# Patient Record
Sex: Female | Born: 1965 | Race: White | Hispanic: No | Marital: Married | Smoking: Never smoker
Health system: Southern US, Community
[De-identification: ages and names within clinical notes are randomized; demographics above are authoritative.]

## PROBLEM LIST (undated history)

## (undated) DIAGNOSIS — E079 Disorder of thyroid, unspecified: Secondary | ICD-10-CM

## (undated) DIAGNOSIS — G4730 Sleep apnea, unspecified: Secondary | ICD-10-CM

## (undated) DIAGNOSIS — R600 Localized edema: Secondary | ICD-10-CM

## (undated) DIAGNOSIS — E669 Obesity, unspecified: Secondary | ICD-10-CM

## (undated) DIAGNOSIS — D649 Anemia, unspecified: Secondary | ICD-10-CM

## (undated) DIAGNOSIS — E538 Deficiency of other specified B group vitamins: Secondary | ICD-10-CM

## (undated) DIAGNOSIS — E0590 Thyrotoxicosis, unspecified without thyrotoxic crisis or storm: Secondary | ICD-10-CM

## (undated) DIAGNOSIS — B019 Varicella without complication: Secondary | ICD-10-CM

## (undated) DIAGNOSIS — K219 Gastro-esophageal reflux disease without esophagitis: Secondary | ICD-10-CM

## (undated) DIAGNOSIS — R739 Hyperglycemia, unspecified: Secondary | ICD-10-CM

## (undated) DIAGNOSIS — N644 Mastodynia: Secondary | ICD-10-CM

## (undated) DIAGNOSIS — Z8639 Personal history of other endocrine, nutritional and metabolic disease: Secondary | ICD-10-CM

## (undated) DIAGNOSIS — F418 Other specified anxiety disorders: Secondary | ICD-10-CM

## (undated) DIAGNOSIS — E785 Hyperlipidemia, unspecified: Secondary | ICD-10-CM

## (undated) HISTORY — DX: Disorder of thyroid, unspecified: E07.9

## (undated) HISTORY — DX: Other specified anxiety disorders: F41.8

## (undated) HISTORY — DX: Hyperlipidemia, unspecified: E78.5

## (undated) HISTORY — DX: Sleep apnea, unspecified: G47.30

## (undated) HISTORY — DX: Obesity, unspecified: E66.9

## (undated) HISTORY — DX: Mastodynia: N64.4

## (undated) HISTORY — PX: CHOLECYSTECTOMY: SHX55

## (undated) HISTORY — DX: Deficiency of other specified B group vitamins: E53.8

## (undated) HISTORY — DX: Localized edema: R60.0

## (undated) HISTORY — DX: Gastro-esophageal reflux disease without esophagitis: K21.9

## (undated) HISTORY — DX: Anemia, unspecified: D64.9

## (undated) HISTORY — PX: EYE SURGERY: SHX253

## (undated) HISTORY — DX: Hyperglycemia, unspecified: R73.9

## (undated) HISTORY — DX: Personal history of other endocrine, nutritional and metabolic disease: Z86.39

## (undated) HISTORY — DX: Thyrotoxicosis, unspecified without thyrotoxic crisis or storm: E05.90

## (undated) HISTORY — DX: Varicella without complication: B01.9

---

## 2000-12-17 ENCOUNTER — Other Ambulatory Visit: Admission: RE | Admit: 2000-12-17 | Discharge: 2000-12-17 | Payer: Self-pay | Admitting: *Deleted

## 2002-01-05 ENCOUNTER — Other Ambulatory Visit: Admission: RE | Admit: 2002-01-05 | Discharge: 2002-01-05 | Payer: Self-pay | Admitting: *Deleted

## 2003-02-18 ENCOUNTER — Inpatient Hospital Stay (HOSPITAL_COMMUNITY): Admission: AD | Admit: 2003-02-18 | Discharge: 2003-02-21 | Payer: Self-pay | Admitting: *Deleted

## 2003-02-18 ENCOUNTER — Encounter (INDEPENDENT_AMBULATORY_CARE_PROVIDER_SITE_OTHER): Payer: Self-pay | Admitting: *Deleted

## 2003-02-18 ENCOUNTER — Encounter: Payer: Self-pay | Admitting: Obstetrics and Gynecology

## 2003-02-22 ENCOUNTER — Encounter: Admission: RE | Admit: 2003-02-22 | Discharge: 2003-03-24 | Payer: Self-pay | Admitting: Obstetrics and Gynecology

## 2003-03-18 ENCOUNTER — Other Ambulatory Visit: Admission: RE | Admit: 2003-03-18 | Discharge: 2003-03-18 | Payer: Self-pay | Admitting: *Deleted

## 2005-07-12 ENCOUNTER — Encounter (INDEPENDENT_AMBULATORY_CARE_PROVIDER_SITE_OTHER): Payer: Self-pay | Admitting: *Deleted

## 2005-07-12 ENCOUNTER — Ambulatory Visit (HOSPITAL_COMMUNITY): Admission: RE | Admit: 2005-07-12 | Discharge: 2005-07-12 | Payer: Self-pay | Admitting: Gastroenterology

## 2005-09-27 ENCOUNTER — Emergency Department (HOSPITAL_COMMUNITY): Admission: EM | Admit: 2005-09-27 | Discharge: 2005-09-27 | Payer: Self-pay | Admitting: Family Medicine

## 2007-04-22 ENCOUNTER — Emergency Department (HOSPITAL_COMMUNITY): Admission: EM | Admit: 2007-04-22 | Discharge: 2007-04-22 | Payer: Self-pay | Admitting: Family Medicine

## 2007-10-02 ENCOUNTER — Emergency Department (HOSPITAL_COMMUNITY): Admission: EM | Admit: 2007-10-02 | Discharge: 2007-10-02 | Payer: Self-pay | Admitting: Family Medicine

## 2009-09-28 ENCOUNTER — Encounter: Admission: RE | Admit: 2009-09-28 | Discharge: 2009-09-28 | Payer: Self-pay | Admitting: Obstetrics & Gynecology

## 2009-10-30 ENCOUNTER — Ambulatory Visit (HOSPITAL_COMMUNITY): Admission: RE | Admit: 2009-10-30 | Discharge: 2009-10-30 | Payer: Self-pay | Admitting: Endocrinology

## 2009-11-22 ENCOUNTER — Encounter: Admission: RE | Admit: 2009-11-22 | Discharge: 2009-11-22 | Payer: Self-pay | Admitting: Endocrinology

## 2010-01-25 HISTORY — PX: RETINAL DETACHMENT REPAIR W/ SCLERAL BUCKLE LE: SHX2338

## 2010-05-27 LAB — HM MAMMOGRAPHY

## 2010-06-02 ENCOUNTER — Encounter: Payer: Self-pay | Admitting: Family Medicine

## 2010-06-02 ENCOUNTER — Ambulatory Visit
Admission: RE | Admit: 2010-06-02 | Discharge: 2010-06-02 | Payer: Self-pay | Source: Home / Self Care | Admitting: Family Medicine

## 2010-06-02 DIAGNOSIS — J069 Acute upper respiratory infection, unspecified: Secondary | ICD-10-CM | POA: Insufficient documentation

## 2010-06-02 DIAGNOSIS — E0590 Thyrotoxicosis, unspecified without thyrotoxic crisis or storm: Secondary | ICD-10-CM

## 2010-06-02 DIAGNOSIS — Z8639 Personal history of other endocrine, nutritional and metabolic disease: Secondary | ICD-10-CM

## 2010-06-02 HISTORY — DX: Personal history of other endocrine, nutritional and metabolic disease: Z86.39

## 2010-06-02 HISTORY — DX: Thyrotoxicosis, unspecified without thyrotoxic crisis or storm: E05.90

## 2010-06-28 NOTE — Assessment & Plan Note (Signed)
Summary: COLD?/TM Room 5   Vital Signs:  Patient Profile:   45 Years Old Female CC:      Cough, congestion x 5 days Height:     65 inches Weight:      247 pounds O2 Sat:      96 % O2 treatment:    Room Air Temp:     99.8 degrees F oral Pulse rate:   105 / minute Pulse rhythm:   regular Resp:     16 per minute BP sitting:   108 / 78  (left arm) Cuff size:   large  Vitals Entered By: Emilio Math (June 02, 2010 2:38 PM)                  Current Allergies: No known allergies History of Present Illness Chief Complaint: Cough, congestion x 5 days History of Present Illness:  Subjective: Patient complains of URI symptoms for 4 days.  She states that she was treated for pneumonia two weeks ago, and seemed to be well. No sore throat. + cough for 4 days. No pleuritic pain No wheezing + nasal congestion for 4 days. ? post-nasal drainage No sinus pain/pressure No itchy/red eyes No earache No hemoptysis No SOB + fever/chills to 100.6 yesterday. No nausea No vomiting No abdominal pain No diarrhea No skin rashes + fatigue + myalgias No headache Used OTC meds without relief   REVIEW OF SYSTEMS Constitutional Symptoms       Complains of fever and chills.     Denies night sweats, weight loss, weight gain, and fatigue.  Eyes       Denies change in vision, eye pain, eye discharge, glasses, contact lenses, and eye surgery. Ear/Nose/Throat/Mouth       Denies hearing loss/aids, change in hearing, ear pain, ear discharge, dizziness, frequent runny nose, frequent nose bleeds, sinus problems, sore throat, hoarseness, and tooth pain or bleeding.  Respiratory       Complains of productive cough and wheezing.      Denies dry cough, shortness of breath, asthma, bronchitis, and emphysema/COPD.  Cardiovascular       Denies murmurs, chest pain, and tires easily with exhertion.    Gastrointestinal       Denies stomach pain, nausea/vomiting, diarrhea, constipation, blood in  bowel movements, and indigestion. Genitourniary       Denies painful urination, kidney stones, and loss of urinary control. Neurological       Denies paralysis, seizures, and fainting/blackouts. Musculoskeletal       Complains of muscle pain.      Denies joint stiffness, decreased range of motion, redness, swelling, muscle weakness, and gout.  Skin       Denies bruising, unusual mles/lumps or sores, and hair/skin or nail changes.  Psych       Denies mood changes, temper/anger issues, anxiety/stress, speech problems, depression, and sleep problems.  Past History:  Past Medical History: Hyperthyroidism  Past Surgical History: Scleral buckle Caesarean section Laparotomy-exploratory  Family History: Mother, High Chol Father, High Chol, HTN  Social History: Non smoker No ETOH No DRugs  Nurse [employer]   Objective:  No acute distress  Eyes:  Pupils are equal, round, and reactive to light and accomdation.  Extraocular movement is intact.  Conjunctivae are not inflamed.  Ears:  Canals normal.  Tympanic membranes normal.   Nose:  Normal septum.  Normal turbinates, mildly congested.   No sinus tenderness present.  Pharynx:  Normal  Neck:  Supple.  No adenopathy  is present.   Lungs:  Clear to auscultation.  Breath sounds are equal.  Heart:  Regular rate and rhythm without murmurs, rubs, or gallops.  Abdomen:  Nontender without masses or hepatosplenomegaly.  Bowel sounds are present.  No CVA or flank tenderness.  Extremities:  No edema.   Skin:  No rash Assessment New Problems: UPPER RESPIRATORY INFECTION, VIRAL (ICD-465.9) HYPERTHYROIDISM (ICD-242.90)  SUSPECT NEW ONSET VIRAL URI.  NO EVIDENCE BACTERIAL INFECTION TODAY.  Plan New Medications/Changes: AZITHROMYCIN 250 MG TABS (AZITHROMYCIN) Two tabs by mouth on day 1, then 1 tab daily on days 2 through 5 (Rx void after 06/10/10)  #6 tabs x 0, 06/02/2010, Donna Christen MD BENZONATATE 200 MG CAPS (BENZONATATE) One by mouth hs as  needed cough  #12 x 0, 06/02/2010, Donna Christen MD  New Orders: Pulse Oximetry (single measurment) [04540] New Patient Level III [98119] Planning Comments:   Treat symptomatically for now:  Increase fluid intake, begin expectorant/decongestant, cough suppressant at bedtime.  If fever/chills/sweats persist, or if not improving 7 days begin Z-pack (given Rx to hold).  Followup with PCP if not improving 10 to 14 days.   The patient and/or caregiver has been counseled thoroughly with regard to medications prescribed including dosage, schedule, interactions, rationale for use, and possible side effects and they verbalize understanding.  Diagnoses and expected course of recovery discussed and will return if not improved as expected or if the condition worsens. Patient and/or caregiver verbalized understanding.  Prescriptions: AZITHROMYCIN 250 MG TABS (AZITHROMYCIN) Two tabs by mouth on day 1, then 1 tab daily on days 2 through 5 (Rx void after 06/10/10)  #6 tabs x 0   Entered and Authorized by:   Donna Christen MD   Signed by:   Donna Christen MD on 06/02/2010   Method used:   Print then Give to Patient   RxID:   [id number] BENZONATATE 200 MG CAPS (BENZONATATE) One by mouth hs as needed cough  #12 x 0   Entered and Authorized by:   Donna Christen MD   Signed by:   Donna Christen MD on 06/02/2010   Method used:   Print then Give to Patient   RxID:   [id number]   Patient Instructions: 1)  May use Mucinex D (guaifenesin with decongestant) twice daily for congestion. 2)  Increase fluid intake, rest. 3)  May use Afrin nasal spray (or generic oxymetazoline) twice daily for about 5 days.  Also recommend using saline nasal spray several times daily and/or saline nasal irrigation. 4)  Begin azithromycin if not improving one week or if persistent fever develops. 5)  Followup with family doctor if not improving 10 to 14 days.  Orders Added: 1)  Pulse Oximetry (single measurment) [94760] 2)   New Patient Level III [01027]

## 2010-06-28 NOTE — Letter (Signed)
Summary: Out of Work  [facility]  [address]   [place], [place] [address]   Phone: [phone]  Fax: [phone]    June 02, 2010   Employee:  ALAYLA DETHLEFS    To Whom It May Concern:   For Medical reasons, please excuse the above named employee from work until 06/04/10.   If you need additional information, please feel free to contact our office.         Sincerely,    Donna Christen MD

## 2010-10-05 ENCOUNTER — Other Ambulatory Visit (HOSPITAL_COMMUNITY): Payer: Self-pay | Admitting: Obstetrics & Gynecology

## 2010-10-05 DIAGNOSIS — Z139 Encounter for screening, unspecified: Secondary | ICD-10-CM

## 2010-10-11 ENCOUNTER — Other Ambulatory Visit: Payer: Self-pay | Admitting: Obstetrics & Gynecology

## 2010-10-12 NOTE — Op Note (Signed)
NAME:  Tiffany Morris, Tiffany Morris                     ACCOUNT NO.:  [id number]   MEDICAL RECORD NO.:  [id number]                   PATIENT TYPE:  INP   LOCATION:  9171                                 FACILITY:  [facility]   PHYSICIAN:  Lenoard Aden, M.D.             DATE OF BIRTH:  1966-05-20   DATE OF PROCEDURE:  02/18/2003  DATE OF DISCHARGE:                                 OPERATIVE REPORT   PREOPERATIVE DIAGNOSES:  1. Thirty-five week intrauterine pregnancy.  2. Complete breech presentation.  3. Active labor.   POSTOPERATIVE DIAGNOSES:  1. Thirty-five week intrauterine pregnancy.  2. Complete breech presentation.  3. Active labor.   PROCEDURE:  Primary low segment transverse cesarean section.   SURGEON:  Lenoard Aden, M.D.   ASSISTANT:  Pershing Cox, M.D.   ANESTHESIA:  Spinal by Raul Del, M.D.   ESTIMATED BLOOD LOSS:  800 mL.   FLUIDS:  2,400 mL of crystalloid, 200 mL of urine, clear.   INSTRUMENT COUNT:  Correct.   DISPOSITION:  The patient to recovery in good condition.   COMPLICATIONS:  None.   FINDINGS:  Full-term living female, Apgars 6 and 53.  Cord pH 7.30.  Placenta  anterior fundal and intact.  Normal uterus, normal tubes and ovaries.  No  uterine anomalies identified.   BRIEF OPERATIVE NOTE:  After being apprised of the risks of anesthesia,  infection, bleeding, injury to abdominal organs, need for repair, the  patient was brought to the operating room where she was administered spinal  anesthetic without complications, prepped and draped in usual sterile  fashion, a Foley catheter placed.  After achieving adequate anesthesia, a  dilute Marcaine solution was placed in the area.  A Pfannenstiel skin  incision was then made with a scalpel, carried down to the fascia which was  nicked in the midline and open transversely using Mayo scissors.  Rectus  muscles dissected sharply in the midline.  The peritoneum entered sharply, a  bladder blade  placed.  The visceral peritoneum was scored in a smile-like  fashion.  The uterus scored with a curved hysterotomy incision.  Atraumatic  delivery of a frank footling breech presentation with the usual maneuvers is  done without difficulty.  Cord pH collected 7.30.  The placenta delivered  manually intact, three-vessel cord.  The uterus is explored and found to be  without evidence of uterine anomalies.  No evidence of bicornual uterus.  Normal tubes and ovaries appreciated.  The uterine incision closed then in  two layers after curetting with a dry lap pack with a 0 Monocryl suture.  Good hemostasis noted.  The bladder flap  inspected and found to be  hemostatic.  The fascia closed using 0 Monocryl.  Deep subcutaneous tissue  closed with a 0 plain suture.  The skin closed using staples.  The patient  tolerated the procedure well and was transferred to recovery in good  condition.  Lenoard Aden, M.D.    RJT/MEDQ  D:  02/18/2003  T:  02/19/2003  Job:  [id number]

## 2010-10-12 NOTE — H&P (Signed)
   NAME:  Tiffany Morris, LITTMAN                     ACCOUNT NO.:  [id number]   MEDICAL RECORD NO.:  [id number]                   PATIENT TYPE:  INP   LOCATION:  9117                                 FACILITY:  [facility]   PHYSICIAN:  Lenoard Aden, M.D.             DATE OF BIRTH:  08/25/1965   DATE OF ADMISSION:  02/18/2003  DATE OF DISCHARGE:                                HISTORY & PHYSICAL   CHIEF COMPLAINT:  Spontaneous rupture of membranes.   HISTORY OF PRESENT ILLNESS:  A 45 year old, white female, G2, P0, EDD  March 27, 2003, at [redacted] weeks gestation, who presents with spontaneous  rupture of membranes at approximately 1 a.m. with clear fluids.   ALLERGIES:  The patient has no known drug allergies.   MEDICATIONS:  Prenatal vitamins and Levoxyl for hypothyroidism.   PAST SURGICAL HISTORY:  History of SAB in December of 2003.   FAMILY HISTORY:  Emphysema, varicose veins, hypertension, diabetes, and lung  cancer.   PAST SURGICAL HISTORY:  The patient has a history of a cholecystectomy in  1996.   PRENATAL LABORATORY DATA:  Blood type O-.  Rubella immune.  Hepatitis and  HIV nonreactive.   PHYSICAL EXAMINATION:  GENERAL APPEARANCE:  She is a well-developed, well-  nourished, white female in no acute distress.  HEENT:  Normal.  LUNGS:  Clear.  HEART:  Regular rhythm.  ABDOMEN:  Soft, gravida, and nontender.  PELVIC:  The cervix is 2 cm, 90%, footling breech with questionable occult  cord.  Clear fluid noted and collected.  EXTREMITIES:  No cords.  NEUROLOGIC:  Nonfocal.   IMPRESSION:  1. 35-week intrauterine pregnancy.  2. Spontaneous rupture of membranes with complete breech presentation and     questionable occult cord prolapse.  3. Reassuring fetal heart rate tracing noted.  4. Cervical change noted.    IMPRESSION AND PLAN:  Proceed with primary C-section.  The risks and  benefits were discussed.  The risk of anesthesia, infection, bleeding,  injury to abdominal  organs and need for repair were discussed.  Delayed  versus immediate complications to include bowel and bladder injury noted.  The patient acknowledges and wishes to proceed.                                               Lenoard Aden, M.D.    RJT/MEDQ  D:  02/18/2003  T:  02/18/2003  Job:  [id number]

## 2010-10-12 NOTE — Discharge Summary (Signed)
   NAME:  Tiffany Morris, Tiffany Morris                     ACCOUNT NO.:  [id number]   MEDICAL RECORD NO.:  [id number]                   PATIENT TYPE:  INP   LOCATION:  9117                                 FACILITY:  [facility]   PHYSICIAN:  Lenoard Aden, M.D.             DATE OF BIRTH:  05/07/1966   DATE OF ADMISSION:  02/18/2003  DATE OF DISCHARGE:  02/21/2003                                 DISCHARGE SUMMARY   HISTORY OF PRESENT ILLNESS:  The patient underwent an uncomplicated primary  C-section for footling breech  presentation on February 18, 2003.   HOSPITAL COURSE:  Postoperative course uncomplicated. She tolerated  a  regular diet well. Postoperatively she was discharged to home on hospital  day #3.   DISCHARGE MEDICATIONS:  Motrin, prenatal vitamins, Percocet given.   FOLLOW UP:  Follow up in the office in 4 to 6 weeks.                                               Lenoard Aden, M.D.    RJT/MEDQ  D:  03/19/2003  T:  03/19/2003  Job:  [id number]

## 2010-11-02 ENCOUNTER — Ambulatory Visit (HOSPITAL_COMMUNITY): Payer: 59

## 2010-11-13 ENCOUNTER — Ambulatory Visit (HOSPITAL_COMMUNITY)
Admission: RE | Admit: 2010-11-13 | Discharge: 2010-11-13 | Disposition: A | Discharge status: Home / Self Care | Payer: 59 | Source: Ambulatory Visit | Attending: Obstetrics & Gynecology | Admitting: Obstetrics & Gynecology

## 2010-11-13 DIAGNOSIS — Z1231 Encounter for screening mammogram for malignant neoplasm of breast: Secondary | ICD-10-CM | POA: Insufficient documentation

## 2010-11-13 DIAGNOSIS — Z139 Encounter for screening, unspecified: Secondary | ICD-10-CM

## 2011-08-05 ENCOUNTER — Encounter: Payer: Self-pay | Admitting: Family Medicine

## 2011-08-05 ENCOUNTER — Ambulatory Visit (INDEPENDENT_AMBULATORY_CARE_PROVIDER_SITE_OTHER): Payer: 59 | Admitting: Family Medicine

## 2011-08-05 DIAGNOSIS — E0590 Thyrotoxicosis, unspecified without thyrotoxic crisis or storm: Secondary | ICD-10-CM

## 2011-08-05 DIAGNOSIS — F341 Dysthymic disorder: Secondary | ICD-10-CM

## 2011-08-05 DIAGNOSIS — E538 Deficiency of other specified B group vitamins: Secondary | ICD-10-CM | POA: Insufficient documentation

## 2011-08-05 DIAGNOSIS — E039 Hypothyroidism, unspecified: Secondary | ICD-10-CM | POA: Insufficient documentation

## 2011-08-05 DIAGNOSIS — Z0000 Encounter for general adult medical examination without abnormal findings: Secondary | ICD-10-CM | POA: Insufficient documentation

## 2011-08-05 DIAGNOSIS — N644 Mastodynia: Secondary | ICD-10-CM | POA: Insufficient documentation

## 2011-08-05 DIAGNOSIS — R600 Localized edema: Secondary | ICD-10-CM

## 2011-08-05 DIAGNOSIS — E079 Disorder of thyroid, unspecified: Secondary | ICD-10-CM | POA: Insufficient documentation

## 2011-08-05 DIAGNOSIS — F418 Other specified anxiety disorders: Secondary | ICD-10-CM

## 2011-08-05 DIAGNOSIS — E669 Obesity, unspecified: Secondary | ICD-10-CM | POA: Insufficient documentation

## 2011-08-05 DIAGNOSIS — R609 Edema, unspecified: Secondary | ICD-10-CM

## 2011-08-05 HISTORY — DX: Mastodynia: N64.4

## 2011-08-05 HISTORY — DX: Localized edema: R60.0

## 2011-08-05 HISTORY — DX: Other specified anxiety disorders: F41.8

## 2011-08-05 MED ORDER — SERTRALINE HCL 50 MG PO TABS: ORAL_TABLET | ORAL | Status: DC

## 2011-08-05 MED ORDER — ALPRAZOLAM 0.25 MG PO TABS: 0.2500 mg | ORAL_TABLET | Freq: Two times a day (BID) | ORAL | Status: AC | PRN

## 2011-08-05 NOTE — Assessment & Plan Note (Signed)
Patient previously told she had a vitamin B12 deficiency we will check levels with her lab work tomorrow.

## 2011-08-05 NOTE — Assessment & Plan Note (Signed)
Patient has been on Fluoxetine in the past and found it marginally helpful. She is noting increased irritability, difficulty concentrating and is agreesing to restart meds. We will start Sertraline 50 mg tabs as directed and she is given a small amount of Alprazolam to use prn

## 2011-08-05 NOTE — Assessment & Plan Note (Signed)
Minimize sodium, elevate feet above her heart twice daily and try Jobst compression hose

## 2011-08-05 NOTE — Assessment & Plan Note (Signed)
Has an appt with her endocrinologist Dr Talmage Nap, agrees to have her TSH run with her annual labs tomorrow. She is made aware of MyChart and advised to activate her account so we can release her labs to her to take with her to her appt. She has in the near future.

## 2011-08-05 NOTE — Patient Instructions (Signed)
Preventive Care for Adults, Female A healthy lifestyle and preventive care can promote health and wellness. Preventive health guidelines for women include the following key practices.  A routine yearly physical is a good way to check with your caregiver about your health and preventive screening. It is a chance to share any concerns and updates on your health, and to receive a thorough exam.   Visit your dentist for a routine exam and preventive care every 6 months. Brush your teeth twice a day and floss once a day. Good oral hygiene prevents tooth decay and gum disease.   The frequency of eye exams is based on your age, health, family medical history, use of contact lenses, and other factors. Follow your caregiver's recommendations for frequency of eye exams.   Eat a healthy diet. Foods like vegetables, fruits, whole grains, low-fat dairy products, and lean protein foods contain the nutrients you need without too many calories. Decrease your intake of foods high in solid fats, added sugars, and salt. Eat the right amount of calories for you.Get information about a proper diet from your caregiver, if necessary.   Regular physical exercise is one of the most important things you can do for your health. Most adults should get at least 150 minutes of moderate-intensity exercise (any activity that increases your heart rate and causes you to sweat) each week. In addition, most adults need muscle-strengthening exercises on 2 or more days a week.   Maintain a healthy weight. The body mass index (BMI) is a screening tool to identify possible weight problems. It provides an estimate of body fat based on height and weight. Your caregiver can help determine your BMI, and can help you achieve or maintain a healthy weight.For adults 20 years and older:   A BMI below 18.5 is considered underweight.   A BMI of 18.5 to 24.9 is normal.   A BMI of 25 to 29.9 is considered overweight.   A BMI of 30 and above is  considered obese.   Maintain normal blood lipids and cholesterol levels by exercising and minimizing your intake of saturated fat. Eat a balanced diet with plenty of fruit and vegetables. Blood tests for lipids and cholesterol should begin at age 20 and be repeated every 5 years. If your lipid or cholesterol levels are high, you are over 50, or you are at high risk for heart disease, you may need your cholesterol levels checked more frequently.Ongoing high lipid and cholesterol levels should be treated with medicines if diet and exercise are not effective.   If you smoke, find out from your caregiver how to quit. If you do not use tobacco, do not start.   If you are pregnant, do not drink alcohol. If you are breastfeeding, be very cautious about drinking alcohol. If you are not pregnant and choose to drink alcohol, do not exceed 1 drink per day. One drink is considered to be 12 ounces (355 mL) of beer, 5 ounces (148 mL) of wine, or 1.5 ounces (44 mL) of liquor.   Avoid use of street drugs. Do not share needles with anyone. Ask for help if you need support or instructions about stopping the use of drugs.   High blood pressure causes heart disease and increases the risk of stroke. Your blood pressure should be checked at least every 1 to 2 years. Ongoing high blood pressure should be treated with medicines if weight loss and exercise are not effective.   If you are 55 to 44   years old, ask your caregiver if you should take aspirin to prevent strokes.   Diabetes screening involves taking a blood sample to check your fasting blood sugar level. This should be done once every 3 years, after age 45, if you are within normal weight and without risk factors for diabetes. Testing should be considered at a younger age or be carried out more frequently if you are overweight and have at least 1 risk factor for diabetes.   Breast cancer screening is essential preventive care for women. You should practice "breast  self-awareness." This means understanding the normal appearance and feel of your breasts and may include breast self-examination. Any changes detected, no matter how small, should be reported to a caregiver. Women in their 20s and 30s should have a clinical breast exam (CBE) by a caregiver as part of a regular health exam every 1 to 3 years. After age 40, women should have a CBE every year. Starting at age 40, women should consider having a mammography (breast X-ray test) every year. Women who have a family history of breast cancer should talk to their caregiver about genetic screening. Women at a high risk of breast cancer should talk to their caregivers about having magnetic resonance imaging (MRI) and a mammography every year.   The Pap test is a screening test for cervical cancer. A Pap test can show cell changes on the cervix that might become cervical cancer if left untreated. A Pap test is a procedure in which cells are obtained and examined from the lower end of the uterus (cervix).   Women should have a Pap test starting at age 21.   Between ages 21 and 29, Pap tests should be repeated every 2 years.   Beginning at age 30, you should have a Pap test every 3 years as long as the past 3 Pap tests have been normal.   Some women have medical problems that increase the chance of getting cervical cancer. Talk to your caregiver about these problems. It is especially important to talk to your caregiver if a new problem develops soon after your last Pap test. In these cases, your caregiver may recommend more frequent screening and Pap tests.   The above recommendations are the same for women who have or have not gotten the vaccine for human papillomavirus (HPV).   If you had a hysterectomy for a problem that was not cancer or a condition that could lead to cancer, then you no longer need Pap tests. Even if you no longer need a Pap test, a regular exam is a good idea to make sure no other problems are  starting.   If you are between ages 65 and 70, and you have had normal Pap tests going back 10 years, you no longer need Pap tests. Even if you no longer need a Pap test, a regular exam is a good idea to make sure no other problems are starting.   If you have had past treatment for cervical cancer or a condition that could lead to cancer, you need Pap tests and screening for cancer for at least 20 years after your treatment.   If Pap tests have been discontinued, risk factors (such as a new sexual partner) need to be reassessed to determine if screening should be resumed.   The HPV test is an additional test that may be used for cervical cancer screening. The HPV test looks for the virus that can cause the cell changes on the cervix.   The cells collected during the Pap test can be tested for HPV. The HPV test could be used to screen women aged 30 years and older, and should be used in women of any age who have unclear Pap test results. After the age of 30, women should have HPV testing at the same frequency as a Pap test.   Colorectal cancer can be detected and often prevented. Most routine colorectal cancer screening begins at the age of 50 and continues through age 75. However, your caregiver may recommend screening at an earlier age if you have risk factors for colon cancer. On a yearly basis, your caregiver may provide home test kits to check for hidden blood in the stool. Use of a small camera at the end of a tube, to directly examine the colon (sigmoidoscopy or colonoscopy), can detect the earliest forms of colorectal cancer. Talk to your caregiver about this at age 50, when routine screening begins. Direct examination of the colon should be repeated every 5 to 10 years through age 75, unless early forms of pre-cancerous polyps or small growths are found.   Hepatitis C blood testing is recommended for all people born from 1945 through 1965 and any individual with known risks for hepatitis C.    Practice safe sex. Use condoms and avoid high-risk sexual practices to reduce the spread of sexually transmitted infections (STIs). STIs include gonorrhea, chlamydia, syphilis, trichomonas, herpes, HPV, and human immunodeficiency virus (HIV). Herpes, HIV, and HPV are viral illnesses that have no cure. They can result in disability, cancer, and death. Sexually active women aged 25 and younger should be checked for chlamydia. Older women with new or multiple partners should also be tested for chlamydia. Testing for other STIs is recommended if you are sexually active and at increased risk.   Osteoporosis is a disease in which the bones lose minerals and strength with aging. This can result in serious bone fractures. The risk of osteoporosis can be identified using a bone density scan. Women ages 65 and over and women at risk for fractures or osteoporosis should discuss screening with their caregivers. Ask your caregiver whether you should take a calcium supplement or vitamin D to reduce the rate of osteoporosis.   Menopause can be associated with physical symptoms and risks. Hormone replacement therapy is available to decrease symptoms and risks. You should talk to your caregiver about whether hormone replacement therapy is right for you.   Use sunscreen with sun protection factor (SPF) of 30 or more. Apply sunscreen liberally and repeatedly throughout the day. You should seek shade when your shadow is shorter than you. Protect yourself by wearing long sleeves, pants, a wide-brimmed hat, and sunglasses year round, whenever you are outdoors.   Once a month, do a whole body skin exam, using a mirror to look at the skin on your back. Notify your caregiver of new moles, moles that have irregular borders, moles that are larger than a pencil eraser, or moles that have changed in shape or color.   Stay current with required immunizations.   Influenza. You need a dose every fall (or winter). The composition of  the flu vaccine changes each year, so being vaccinated once is not enough.   Pneumococcal polysaccharide. You need 1 to 2 doses if you smoke cigarettes or if you have certain chronic medical conditions. You need 1 dose at age 65 (or older) if you have never been vaccinated.   Tetanus, diphtheria, pertussis (Tdap, Td). Get 1 dose of   Tdap vaccine if you are younger than age 65, are over 65 and have contact with an infant, are a healthcare worker, are pregnant, or simply want to be protected from whooping cough. After that, you need a Td booster dose every 10 years. Consult your caregiver if you have not had at least 3 tetanus and diphtheria-containing shots sometime in your life or have a deep or dirty wound.   HPV. You need this vaccine if you are a woman age 26 or younger. The vaccine is given in 3 doses over 6 months.   Measles, mumps, rubella (MMR). You need at least 1 dose of MMR if you were born in 1957 or later. You may also need a second dose.   Meningococcal. If you are age 19 to 21 and a first-year college student living in a residence hall, or have one of several medical conditions, you need to get vaccinated against meningococcal disease. You may also need additional booster doses.   Zoster (shingles). If you are age 60 or older, you should get this vaccine.   Varicella (chickenpox). If you have never had chickenpox or you were vaccinated but received only 1 dose, talk to your caregiver to find out if you need this vaccine.   Hepatitis A. You need this vaccine if you have a specific risk factor for hepatitis A virus infection or you simply wish to be protected from this disease. The vaccine is usually given as 2 doses, 6 to 18 months apart.   Hepatitis B. You need this vaccine if you have a specific risk factor for hepatitis B virus infection or you simply wish to be protected from this disease. The vaccine is given in 3 doses, usually over 6 months.  Preventive Services /  Frequency Ages 19 to 39  Blood pressure check.** / Every 1 to 2 years.   Lipid and cholesterol check.** / Every 5 years beginning at age 20.   Clinical breast exam.** / Every 3 years for women in their 20s and 30s.   Pap test.** / Every 2 years from ages 21 through 29. Every 3 years starting at age 30 through age 65 or 70 with a history of 3 consecutive normal Pap tests.   HPV screening.** / Every 3 years from ages 30 through ages 65 to 70 with a history of 3 consecutive normal Pap tests.   Hepatitis C blood test.** / For any individual with known risks for hepatitis C.   Skin self-exam. / Monthly.   Influenza immunization.** / Every year.   Pneumococcal polysaccharide immunization.** / 1 to 2 doses if you smoke cigarettes or if you have certain chronic medical conditions.   Tetanus, diphtheria, pertussis (Tdap, Td) immunization. / A one-time dose of Tdap vaccine. After that, you need a Td booster dose every 10 years.   HPV immunization. / 3 doses over 6 months, if you are 26 and younger.   Measles, mumps, rubella (MMR) immunization. / You need at least 1 dose of MMR if you were born in 1957 or later. You may also need a second dose.   Meningococcal immunization. / 1 dose if you are age 19 to 21 and a first-year college student living in a residence hall, or have one of several medical conditions, you need to get vaccinated against meningococcal disease. You may also need additional booster doses.   Varicella immunization.** / Consult your caregiver.   Hepatitis A immunization.** / Consult your caregiver. 2 doses, 6 to 18 months   apart.   Hepatitis B immunization.** / Consult your caregiver. 3 doses usually over 6 months.  Ages 40 to 64  Blood pressure check.** / Every 1 to 2 years.   Lipid and cholesterol check.** / Every 5 years beginning at age 20.   Clinical breast exam.** / Every year after age 40.   Mammogram.** / Every year beginning at age 40 and continuing for as  long as you are in good health. Consult with your caregiver.   Pap test.** / Every 3 years starting at age 30 through age 65 or 70 with a history of 3 consecutive normal Pap tests.   HPV screening.** / Every 3 years from ages 30 through ages 65 to 70 with a history of 3 consecutive normal Pap tests.   Fecal occult blood test (FOBT) of stool. / Every year beginning at age 50 and continuing until age 75. You may not need to do this test if you get a colonoscopy every 10 years.   Flexible sigmoidoscopy or colonoscopy.** / Every 5 years for a flexible sigmoidoscopy or every 10 years for a colonoscopy beginning at age 50 and continuing until age 75.   Hepatitis C blood test.** / For all people born from 1945 through 1965 and any individual with known risks for hepatitis C.   Skin self-exam. / Monthly.   Influenza immunization.** / Every year.   Pneumococcal polysaccharide immunization.** / 1 to 2 doses if you smoke cigarettes or if you have certain chronic medical conditions.   Tetanus, diphtheria, pertussis (Tdap, Td) immunization.** / A one-time dose of Tdap vaccine. After that, you need a Td booster dose every 10 years.   Measles, mumps, rubella (MMR) immunization. / You need at least 1 dose of MMR if you were born in 1957 or later. You may also need a second dose.   Varicella immunization.** / Consult your caregiver.   Meningococcal immunization.** / Consult your caregiver.   Hepatitis A immunization.** / Consult your caregiver. 2 doses, 6 to 18 months apart.   Hepatitis B immunization.** / Consult your caregiver. 3 doses, usually over 6 months.  Ages 65 and over  Blood pressure check.** / Every 1 to 2 years.   Lipid and cholesterol check.** / Every 5 years beginning at age 20.   Clinical breast exam.** / Every year after age 40.   Mammogram.** / Every year beginning at age 40 and continuing for as long as you are in good health. Consult with your caregiver.   Pap test.** /  Every 3 years starting at age 30 through age 65 or 70 with a 3 consecutive normal Pap tests. Testing can be stopped between 65 and 70 with 3 consecutive normal Pap tests and no abnormal Pap or HPV tests in the past 10 years.   HPV screening.** / Every 3 years from ages 30 through ages 65 or 70 with a history of 3 consecutive normal Pap tests. Testing can be stopped between 65 and 70 with 3 consecutive normal Pap tests and no abnormal Pap or HPV tests in the past 10 years.   Fecal occult blood test (FOBT) of stool. / Every year beginning at age 50 and continuing until age 75. You may not need to do this test if you get a colonoscopy every 10 years.   Flexible sigmoidoscopy or colonoscopy.** / Every 5 years for a flexible sigmoidoscopy or every 10 years for a colonoscopy beginning at age 50 and continuing until age 75.   Hepatitis   C blood test.** / For all people born from [birth year] through 1965 and any individual with known risks for hepatitis C.   Osteoporosis screening.** / A one-time screening for women ages 76 and over and women at risk for fractures or osteoporosis.   Skin self-exam. / Monthly.   Influenza immunization.** / Every year.   Pneumococcal polysaccharide immunization.** / 1 dose at age 56 (or older) if you have never been vaccinated.   Tetanus, diphtheria, pertussis (Tdap, Td) immunization. / A one-time dose of Tdap vaccine if you are over 65 and have contact with an infant, are a Research scientist (physical sciences), or simply want to be protected from whooping cough. After that, you need a Td booster dose every 10 years.   Varicella immunization.** / Consult your caregiver.   Meningococcal immunization.** / Consult your caregiver.   Hepatitis A immunization.** / Consult your caregiver. 2 doses, 6 to 18 months apart.   Hepatitis B immunization.** / Check with your caregiver. 3 doses, usually over 6 months.  ** Family history and personal history of risk and conditions may change your caregiver's  recommendations. Document Released: 07/09/2001 Document Revised: 05/02/2011 Document Reviewed: 10/08/2010 [facility] Patient Information 2012 [place], [place].  Jobst stockings, knee highs, light weight 10-20 mm hg on in am off in pm

## 2011-08-05 NOTE — Assessment & Plan Note (Signed)
Requested records from previous pmd, encouraged 7-8 hours of sleep, DASH diet, confirmed seat belt use. Given handout on DASH diet.

## 2011-08-05 NOTE — Assessment & Plan Note (Signed)
Patient is up to date on her mgms and follows with gyn, does have some tissue findings in left breast that are midly tender and c/w fibrocystic breast disease, she has been drinking more caffeine than usual lately, encouraged to give up caffeine for a couple weeks and see if her symptoms improve. If no improvement may need further imaging

## 2011-08-05 NOTE — Assessment & Plan Note (Signed)
Given a handout on the DASH diet and encouraged to add a daily walk and maintain a low cal, heart healthy diet.

## 2011-08-05 NOTE — Progress Notes (Signed)
Patient ID: Tiffany Morris, female   DOB: [birth year], 46 y.o.   MRN: [id number] Tiffany Morris [id number] 1966-01-25 08/05/2011      Progress Note New Patient  Subjective  Chief Complaint  Chief Complaint  Patient presents with  . Establish Care    new patient    HPI  Patient is a 46 year old Caucasian female in today to establish care. She is reporting some discomfort in her left breast for 2 days now. Possible palpable lesion around 3:00. Does admit to drinking increased caffeine recently. 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 Does not want this on her feet is having increased pedal edema bilaterally. Unsure if this is better. She has a history of depression and feels that is somewhat worse recently since difficulty concentrating increased irritability like to restart medication previously partially responded to Prozac. She has a history of thyroid disease previously hyperthyroidism now hypothyroid. Most recent sheath bleed she had an episode of palpitations was found to be over treated. She sees Dr. Cleon Gustin for endocrinology and has an appointment in the near future. He has not recently been exercising watching her diet.  Past Medical History  Diagnosis Date  . Chicken pox as a child  . Thyroid disease     hyper  . Vitamin B 12 deficiency   . Obesity   . Depression with anxiety 08/05/2011  . hypothyroidism 06/02/2010  . Pain of left breast 08/05/2011    Past Surgical History  Procedure Date  . Cesarean section 2004  . Retinal detachment repair w/ scleral buckle le 01-25-10  . Cholecystectomy     laparoscopic    Family History  Problem Relation Age of Onset  . Hyperlipidemia Mother   . Diabetes Mother     borderline  . Hyperlipidemia Father   . Hypertension Father   . Diabetes Father     type 2  . Hyperthyroidism Father   . Obesity Father   . Cancer Maternal Grandmother     lung- smoker  . COPD Maternal  Grandfather     smoked  . Cancer Maternal Grandfather     bladder  . Heart disease Paternal Grandmother     CHF, MI  . Hypothyroidism Paternal Grandmother   . Hypertension Paternal Grandmother   . Hypertension Paternal Grandfather   . Heart disease Paternal Grandfather     open heart surgery  . Hyperlipidemia Paternal Grandfather   . Macular degeneration Paternal Grandfather     History   Social History  . Marital Status: Married    Spouse Name: N/A    Number of Children: N/A  . Years of Education: N/A   Occupational History  . Not on file.   Social History Main Topics  . Smoking status: Never Smoker   . Smokeless tobacco: Never Used  . Alcohol Use: No  . Drug Use: No  . Sexually Active: Not Currently   Other Topics Concern  . Not on file   Social History Narrative  . No narrative on file    Current Outpatient Prescriptions on File Prior to Visit  Medication Sig Dispense Refill  . levonorgestrel (MIRENA) 20 MCG/24HR IUD 1 each by Intrauterine route once.        No Known Allergies  Review of Systems  Review of Systems  Constitutional: Positive for malaise/fatigue. Negative for fever and chills.  HENT: Negative for hearing loss, nosebleeds and congestion.   Eyes: Negative for discharge.  Respiratory: Negative for cough, sputum production, shortness of breath and wheezing.   Cardiovascular: Positive for leg swelling. Negative for chest pain and palpitations.       B/l lower extremitity edema worse after a long day on her feet..  Gastrointestinal: Negative for heartburn, nausea, vomiting, abdominal pain, diarrhea, constipation and blood in stool.  Genitourinary: Negative for dysuria, urgency, frequency and hematuria.  Musculoskeletal: Negative for myalgias, back pain and falls.  Skin: Negative for rash.  Neurological: Negative for dizziness, tremors, sensory change, focal weakness, loss of consciousness, weakness and headaches.  Endo/Heme/Allergies: Negative  for polydipsia. Does not bruise/bleed easily.  Psychiatric/Behavioral: Positive for depression. Negative for suicidal ideas. The patient is nervous/anxious. The patient does not have insomnia.     Objective  BP 127/84  Pulse 91  Temp(Src) 98 F (36.7 C) (Temporal)  Ht 5\' 5"  (1.651 m)  Wt 266 lb (120.657 kg)  BMI 44.26 kg/m2  SpO2 97%  LMP 07/22/2011  Physical Exam  Physical Exam  Constitutional: She is oriented to person, place, and time and well-developed, well-nourished, and in no distress. No distress.  HENT:  Head: Normocephalic and atraumatic.  Right Ear: External ear normal.  Left Ear: External ear normal.  Nose: Nose normal.  Mouth/Throat: Oropharynx is clear and moist. No oropharyngeal exudate.  Eyes: Conjunctivae are normal. Pupils are equal, round, and reactive to light. Right eye exhibits no discharge. Left eye exhibits no discharge. No scleral icterus.  Neck: Normal range of motion. Neck supple. No thyromegaly present.  Cardiovascular: Normal rate, regular rhythm, normal heart sounds and intact distal pulses.   No murmur heard. Pulmonary/Chest: Effort normal and breath sounds normal. No respiratory distress. She has no wheezes. She has no rales.  Abdominal: Soft. Bowel sounds are normal. She exhibits no distension and no mass. There is no tenderness.  Musculoskeletal: Normal range of motion. She exhibits no edema and no tenderness.  Lymphadenopathy:    She has no cervical adenopathy.  Neurological: She is alert and oriented to person, place, and time. She has normal reflexes. No cranial nerve deficit. Coordination normal.  Skin: Skin is warm and dry. No rash noted. She is not diaphoretic.  Psychiatric: Mood, memory and affect normal.       Assessment & Plan  hypothyroidism Has an appt with her endocrinologist Dr Talmage Nap, agrees to have her TSH run with her annual labs tomorrow. She is made aware of MyChart and advised to activate her account so we can release  her labs to her to take with her to her appt. She has in the near future.  Depression with anxiety Patient has been on Fluoxetine in the past and found it marginally helpful. She is noting increased irritability, difficulty concentrating and is agreesing to restart meds. We will start Sertraline 50 mg tabs as directed and she is given a small amount of Alprazolam to use prn   Obesity Given a handout on the DASH diet and encouraged to add a daily walk and maintain a low cal, heart healthy diet.  Vitamin B 12 deficiency Patient previously told she had a vitamin B12 deficiency we will check levels with her lab work tomorrow.  Pain of left breast Patient is up to date on her mgms and follows with gyn, does have some tissue findings in left breast that are midly tender and c/w fibrocystic breast disease, she has been drinking more caffeine than usual lately, encouraged to give up caffeine for a couple weeks and see if her symptoms  improve. If no improvement may need further imaging  Preventative health care Requested records from previous pmd, encouraged 7-8 hours of sleep, DASH diet, confirmed seat belt use. Given handout on DASH diet.  Pedal edema Minimize sodium, elevate feet above her heart twice daily and try Jobst compression hose

## 2011-08-06 ENCOUNTER — Other Ambulatory Visit (INDEPENDENT_AMBULATORY_CARE_PROVIDER_SITE_OTHER): Payer: 59

## 2011-08-06 DIAGNOSIS — Z0000 Encounter for general adult medical examination without abnormal findings: Secondary | ICD-10-CM

## 2011-08-06 DIAGNOSIS — E538 Deficiency of other specified B group vitamins: Secondary | ICD-10-CM

## 2011-08-06 LAB — RENAL FUNCTION PANEL
BUN: 9 mg/dL (ref 6–23)
CO2: 26 mEq/L (ref 19–32)
Chloride: 101 mEq/L (ref 96–112)
Creatinine, Ser: 0.8 mg/dL (ref 0.4–1.2)
GFR: 82.99 mL/min (ref >60.00)
Phosphorus: 3.1 mg/dL (ref 2.3–4.6)
Sodium: 135 mEq/L (ref 135–145)

## 2011-08-06 LAB — CBC
Hemoglobin: 14.1 g/dL (ref 12.0–15.0)
MCHC: 33.3 g/dL (ref 30.0–36.0)
RDW: 13.4 % (ref 11.5–14.6)
WBC: 7.8 10*3/uL (ref 4.5–10.5)

## 2011-08-06 LAB — HEPATIC FUNCTION PANEL
ALT: 19 U/L (ref 0–35)
AST: 18 U/L (ref 0–37)
Albumin: 3.4 g/dL — ABNORMAL LOW (ref 3.5–5.2)
Alkaline Phosphatase: 54 U/L (ref 39–117)
Bilirubin, Direct: 0 mg/dL (ref 0.0–0.3)
Total Protein: 6.8 g/dL (ref 6.0–8.3)

## 2011-08-06 LAB — LDL CHOLESTEROL, DIRECT: Direct LDL: 138.9 mg/dL

## 2011-08-06 LAB — LIPID PANEL
Total CHOL/HDL Ratio: 5
Triglycerides: 106 mg/dL (ref 0.0–149.0)

## 2011-08-06 LAB — VITAMIN B12: Vitamin B-12: 207 pg/mL — ABNORMAL LOW (ref 211–911)

## 2011-08-07 ENCOUNTER — Encounter: Payer: Self-pay | Admitting: Family Medicine

## 2011-08-09 ENCOUNTER — Telehealth: Payer: Self-pay

## 2011-08-09 NOTE — Telephone Encounter (Signed)
Patient would like MD to release her labs to mychart? Pt aware that MD is out of office until Monday.

## 2011-08-12 NOTE — Telephone Encounter (Signed)
Dr Abner Greenspan can release pts labs to Mychart?

## 2011-08-12 NOTE — Telephone Encounter (Signed)
done

## 2011-09-05 ENCOUNTER — Encounter: Payer: Self-pay | Admitting: Family Medicine

## 2011-09-05 ENCOUNTER — Ambulatory Visit (INDEPENDENT_AMBULATORY_CARE_PROVIDER_SITE_OTHER): Payer: 59 | Admitting: Family Medicine

## 2011-09-05 VITALS — BP 123/82 | HR 92 | Temp 98.1°F | Ht 65.0 in | Wt 262.4 lb

## 2011-09-05 DIAGNOSIS — E785 Hyperlipidemia, unspecified: Secondary | ICD-10-CM | POA: Insufficient documentation

## 2011-09-05 DIAGNOSIS — F419 Anxiety disorder, unspecified: Secondary | ICD-10-CM

## 2011-09-05 DIAGNOSIS — F329 Major depressive disorder, single episode, unspecified: Secondary | ICD-10-CM

## 2011-09-05 DIAGNOSIS — R600 Localized edema: Secondary | ICD-10-CM

## 2011-09-05 DIAGNOSIS — F418 Other specified anxiety disorders: Secondary | ICD-10-CM

## 2011-09-05 DIAGNOSIS — E538 Deficiency of other specified B group vitamins: Secondary | ICD-10-CM

## 2011-09-05 DIAGNOSIS — R609 Edema, unspecified: Secondary | ICD-10-CM

## 2011-09-05 DIAGNOSIS — H60399 Other infective otitis externa, unspecified ear: Secondary | ICD-10-CM

## 2011-09-05 DIAGNOSIS — H6091 Unspecified otitis externa, right ear: Secondary | ICD-10-CM

## 2011-09-05 DIAGNOSIS — E669 Obesity, unspecified: Secondary | ICD-10-CM

## 2011-09-05 DIAGNOSIS — F341 Dysthymic disorder: Secondary | ICD-10-CM

## 2011-09-05 HISTORY — DX: Hyperlipidemia, unspecified: E78.5

## 2011-09-05 MED ORDER — NEOMYCIN-POLYMYXIN-HC 3.5-10000-1 OT SOLN: 3.0000 [drp] | Freq: Four times a day (QID) | OTIC | Status: AC

## 2011-09-05 MED ORDER — SERTRALINE HCL 50 MG PO TABS: 50.0000 mg | ORAL_TABLET | Freq: Every day | ORAL | Status: DC

## 2011-09-05 NOTE — Assessment & Plan Note (Signed)
Mild, encouraged to avoid trans fats, add a Megared cap daily and increase exercise

## 2011-09-05 NOTE — Assessment & Plan Note (Signed)
Does feel that the Sertraline is helping, she still struggles with some symptoms but they are better, she feels she is more patient with her family, we will continue 50 mg for the next month and then increase at her discretion if she does not feel she is improving enough. Has not needed the Alprazolam thus far

## 2011-09-05 NOTE — Assessment & Plan Note (Signed)
Mild, has started in with oral supplements and we will recheck levels with intrinsic factor, homocysteine levels and mm at next visit

## 2011-09-05 NOTE — Assessment & Plan Note (Signed)
Encouraged DASH diet and increased exercise 

## 2011-09-05 NOTE — Assessment & Plan Note (Signed)
No concerns today 

## 2011-09-05 NOTE — Progress Notes (Signed)
Patient ID: Tiffany Morris, female   DOB: 30-Jan-1966, 46 y.o.   MRN: [id number] SREYA FROIO [id number] 03-19-1966 09/05/2011      Progress Note-Follow Up  Subjective  Chief Complaint  Chief Complaint  Patient presents with  . Follow-up    1 month    HPI  Patient is a 46 year old Caucasian female in today for followup. At her initial visit we started sertraline for anxiety depression and she does feel is helping. She says she is much less short tempered with her family. Her job is stressful due to the fact that it is new but she feels she's tolerating it well. She denies any severe depression or suicidal ideation. Overall she reports she feels well. Complaining of any chest pain, palpitations, shortness of breath, edema, GI or GU concerns at today's visit. She is trying to be somewhat better but has not started an exercise regimen thus far.  Past Medical History  Diagnosis Date  . Chicken pox as a child  . Thyroid disease     hyper  . Vitamin B 12 deficiency   . Obesity   . Depression with anxiety 08/05/2011  . hypothyroidism 06/02/2010  . Pain of left breast 08/05/2011  . Pedal edema 08/05/2011  . Hyperlipidemia 09/05/2011    Past Surgical History  Procedure Date  . Cesarean section 2004  . Retinal detachment repair w/ scleral buckle le 01-25-10  . Cholecystectomy     laparoscopic    Family History  Problem Relation Age of Onset  . Hyperlipidemia Mother   . Diabetes Mother     borderline  . Hyperlipidemia Father   . Hypertension Father   . Diabetes Father     type 2  . Hyperthyroidism Father   . Obesity Father   . Cancer Maternal Grandmother     lung- smoker  . COPD Maternal Grandfather     smoked  . Cancer Maternal Grandfather     bladder  . Heart disease Paternal Grandmother     CHF, MI  . Hypothyroidism Paternal Grandmother   . Hypertension Paternal Grandmother   . Hypertension Paternal Grandfather   . Heart disease Paternal Grandfather     open  heart surgery  . Hyperlipidemia Paternal Grandfather   . Macular degeneration Paternal Grandfather     History   Social History  . Marital Status: Married    Spouse Name: N/A    Number of Children: N/A  . Years of Education: N/A   Occupational History  . Not on file.   Social History Main Topics  . Smoking status: Never Smoker   . Smokeless tobacco: Never Used  . Alcohol Use: No  . Drug Use: No  . Sexually Active: Not Currently   Other Topics Concern  . Not on file   Social History Narrative  . No narrative on file    Current Outpatient Prescriptions on File Prior to Visit  Medication Sig Dispense Refill  . Cyanocobalamin (VITAMIN B-12 CR) 1500 MCG TBCR Take 1 tablet by mouth daily.      Marland Kitchen levonorgestrel (MIRENA) 20 MCG/24HR IUD 1 each by Intrauterine route once.      . multivitamin (THERAGRAN) per tablet Take 1 tablet by mouth daily.      Marland Kitchen DISCONTD: levothyroxine (SYNTHROID, LEVOTHROID) 150 MCG tablet Take 150 mcg by mouth daily.      Marland Kitchen DISCONTD: sertraline (ZOLOFT) 50 MG tablet 1/2 tab po daily x 7 days, then increase to 1 tab po daily  30 tablet  3  . ALPRAZolam (XANAX) 0.25 MG tablet Take 1 tablet (0.25 mg total) by mouth 2 (two) times daily as needed for sleep.  20 tablet  0    No Known Allergies  Review of Systems  Review of Systems  Constitutional: Negative for fever and malaise/fatigue.  HENT: Negative for congestion.   Eyes: Negative for discharge.  Respiratory: Negative for shortness of breath.   Cardiovascular: Negative for chest pain, palpitations and leg swelling.  Gastrointestinal: Negative for nausea, abdominal pain and diarrhea.  Genitourinary: Negative for dysuria.  Musculoskeletal: Negative for falls.  Skin: Negative for rash.  Neurological: Negative for loss of consciousness and headaches.  Endo/Heme/Allergies: Negative for polydipsia.  Psychiatric/Behavioral: Positive for depression. Negative for suicidal ideas. The patient is  nervous/anxious. The patient does not have insomnia.     Objective  BP 123/82  Pulse 92  Temp(Src) 98.1 F (36.7 C) (Temporal)  Ht 5\' 5"  (1.651 m)  Wt 262 lb 6.4 oz (119.024 kg)  BMI 43.67 kg/m2  SpO2 98%  Physical Exam  Physical Exam  Constitutional: She is oriented to person, place, and time and well-developed, well-nourished, and in no distress. No distress.  HENT:  Head: Normocephalic and atraumatic.  Eyes: Conjunctivae are normal.  Neck: Neck supple. No thyromegaly present.  Cardiovascular: Normal rate, regular rhythm and normal heart sounds.   No murmur heard. Pulmonary/Chest: Effort normal and breath sounds normal. She has no wheezes.  Abdominal: Soft. Bowel sounds are normal. She exhibits no distension and no mass.  Musculoskeletal: She exhibits no edema.  Lymphadenopathy:    She has no cervical adenopathy.  Neurological: She is alert and oriented to person, place, and time.  Skin: Skin is warm and dry. No rash noted. She is not diaphoretic.  Psychiatric: Memory, affect and judgment normal.    Lab Results  Component Value Date   TSH 0.39 08/06/2011   Lab Results  Component Value Date   WBC 7.8 08/06/2011   HGB 14.1 08/06/2011   HCT 42.4 08/06/2011   MCV 88.6 08/06/2011   PLT 248.0 08/06/2011   Lab Results  Component Value Date   CREATININE 0.8 08/06/2011   BUN 9 08/06/2011   NA 135 08/06/2011   K 3.8 08/06/2011   CL 101 08/06/2011   CO2 26 08/06/2011   Lab Results  Component Value Date   ALT 19 08/06/2011   AST 18 08/06/2011   ALKPHOS 54 08/06/2011   BILITOT 0.2* 08/06/2011   Lab Results  Component Value Date   CHOL 203* 08/06/2011   Lab Results  Component Value Date   HDL 44.40 08/06/2011   No results found for this basename: LDLCALC   Lab Results  Component Value Date   TRIG 106.0 08/06/2011   Lab Results  Component Value Date   CHOLHDL 5 08/06/2011     Assessment & Plan  Vitamin B 12 deficiency Mild, has started in with oral supplements and  we will recheck levels with intrinsic factor, homocysteine levels and mm at next visit  Obesity Encouraged DASH diet and increased exercise  Depression with anxiety Does feel that the Sertraline is helping, she still struggles with some symptoms but they are better, she feels she is more patient with her family, we will continue 50 mg for the next month and then increase at her discretion if she does not feel she is improving enough. Has not needed the Alprazolam thus far  Pedal edema No concerns today  Hyperlipidemia Mild, encouraged  to avoid trans fats, add a Megared cap daily and increase exercise

## 2011-09-05 NOTE — Patient Instructions (Signed)
Depression  Depression is a strong emotion of feeling unhappy that can last for weeks, months, or even longer. Depression causes problems with the ability to function in life. It upsets your:   Relationships.   Sleep.   Eating habits.   Work habits.  HOME CARE  Take all medicine as told by your doctor.   Talk with a therapist, counselor, or friend.   Eat a healthy diet.   Exercise regularly.   Do not drink alcohol or use drugs.  GET HELP RIGHT AWAY IF: You start to have thoughts about hurting yourself or others. MAKE SURE YOU:  Understand these instructions.   Will watch your condition.   Will get help right away if you are not doing well or get worse.  Document Released: 06/15/2010 Document Revised: 05/02/2011 Document Reviewed: 06/15/2010 [employer] Patient Information 2012 [employer]. 

## 2011-11-07 ENCOUNTER — Other Ambulatory Visit: Payer: Self-pay

## 2011-11-07 MED ORDER — SERTRALINE HCL 100 MG PO TABS: 100.0000 mg | ORAL_TABLET | Freq: Every day | ORAL | Status: DC

## 2011-11-07 NOTE — Telephone Encounter (Signed)
Patient left a message stating that MD asked her to call back if she went from Zoloft 50mg  to 100mg  and we would send in another RX. Pt left a message stating that she did increase this medication and to send Zoloft 100mg  to [facility]

## 2011-12-06 ENCOUNTER — Ambulatory Visit: Payer: 59 | Admitting: Family Medicine

## 2011-12-16 ENCOUNTER — Ambulatory Visit (INDEPENDENT_AMBULATORY_CARE_PROVIDER_SITE_OTHER): Payer: 59 | Admitting: Family Medicine

## 2011-12-16 ENCOUNTER — Encounter: Payer: Self-pay | Admitting: Family Medicine

## 2011-12-16 VITALS — BP 120/82 | HR 81 | Temp 97.0°F | Ht 65.0 in | Wt 250.8 lb

## 2011-12-16 DIAGNOSIS — E039 Hypothyroidism, unspecified: Secondary | ICD-10-CM

## 2011-12-16 DIAGNOSIS — F3289 Other specified depressive episodes: Secondary | ICD-10-CM

## 2011-12-16 DIAGNOSIS — E0590 Thyrotoxicosis, unspecified without thyrotoxic crisis or storm: Secondary | ICD-10-CM

## 2011-12-16 DIAGNOSIS — E785 Hyperlipidemia, unspecified: Secondary | ICD-10-CM

## 2011-12-16 DIAGNOSIS — F32A Depression, unspecified: Secondary | ICD-10-CM

## 2011-12-16 DIAGNOSIS — R609 Edema, unspecified: Secondary | ICD-10-CM

## 2011-12-16 DIAGNOSIS — R600 Localized edema: Secondary | ICD-10-CM

## 2011-12-16 DIAGNOSIS — E538 Deficiency of other specified B group vitamins: Secondary | ICD-10-CM

## 2011-12-16 DIAGNOSIS — E669 Obesity, unspecified: Secondary | ICD-10-CM

## 2011-12-16 DIAGNOSIS — F329 Major depressive disorder, single episode, unspecified: Secondary | ICD-10-CM

## 2011-12-16 LAB — VITAMIN B12

## 2011-12-16 LAB — T4, FREE: Free T4: 0.91 ng/dL (ref 0.60–1.60)

## 2011-12-16 MED ORDER — SERTRALINE HCL 100 MG PO TABS: 100.0000 mg | ORAL_TABLET | Freq: Every day | ORAL | Status: DC

## 2011-12-16 MED ORDER — CYANOCOBALAMIN 1000 MCG/ML IJ SOLN
1000.0000 ug | Freq: Once | INTRAMUSCULAR | Status: AC
  Administered 2011-12-16: 1000 ug via INTRAMUSCULAR

## 2011-12-16 NOTE — Patient Instructions (Addendum)
Pernicious Anemia Pernicious anemia may be an immune system illness. It causes the production of antibodies to cells of the stomach (parietal cells), and proteins produced by the stomach, which are needed to absorb vitamin B12. The result of this illness is the body does not absorb enough B12 from the diet. This leads to lessened red blood cell production which causes anemia. Vitamin B12 is needed for making red blood cells and keeping the nervous system healthy. Not enough vitamin B12 in your system slowly affects sensory and motor nerves. This causes problems with your nervous system (neurological) to develop over time. Neurological effects of vitamin B12 deficiency may be seen before anemia is diagnosed. This affects both men and women, between ages 40 and 70. The anemia also affects the bowel, the heart and vascular systems and cannot be prevented. CAUSES  Pernicious anemia is due to a lack of substance called intrinsic factor. This is a substance made by cells in the stomach. It makes it possible to absorb vitamin B12. The reason for the lack of this substance is unknown but it may be autoimmune, genetic, or both. SYMPTOMS  The following problems may be seen with this illness:  Problems develop slowly.   Rapid heart rate.   Nausea, appetite loss, and weight loss.   Difficulty maintaining proper balance.   Yellow eyes and skin.   Loss of deep tendon reflexes.   Depression.   Confusion, poor memory, and dementia.   Ringing in the ears (tinnitus).   Weakness, especially in the arms and legs.   Sore tongue.   Numbness or tingling in the hands and feet.   Pale lips, tongue, and gums.   Bleeding gums.   Shortness of breath.   Headache.   Fatigue.  RISK OF VITAMIN B12 DEFICIENCY INCREASES WITH:  Diseases or surgery affecting the stomach.   Diabetes and autoimmune disorders. Autoimmune disorders are diseases where the body makes antibodies which attack your own body tissues.     Thyroid disorders.   Genetic factors, such as in people of Northern European ancestry. It is rare in African Americans and Asians.   Family history of pernicious anemia.   Age over 40.   Strict vegetarian diet or infants breast-fed by a mother on a strict vegetarian diet.   Lack of stomach acid in older adults.   Parasitic infections and intestinal diseases.   Drugs such as H2 blockers, proton pump inhibitors, colchicine, neomycin, and aminosalicylic acid.   Alcoholism.  PREVENTION  Pernicious anemia cannot be prevented but vitamin B12 deficiencies can be prevented.   For pernicious anemia, lifelong vitamin B12 therapy will help symptoms and prevent complications.   Dietary changes can prevent deficiency. B12 is mostly from animal sources so a deficiency is more likely in a vegetarian who does not eat eggs or dairy products.  RELATED COMPLICATIONS  Heart failure.   Nerve damage that can not be reversed.   Gastric cancer.  DIAGNOSIS  Your caregiver can determine what is wrong by:  Doing blood tests for vitamin B12 levels.   Checking for antibodies to the intrinsic factor.   Measuring the body's ability to absorb vitamin B12.  TREATMENT   Life long treatment usually involves vitamin B12 replacement. Monthly vitamin B12 injections are the treatment of choice to correct vitamin B12 deficiency and may be given by the patient. This therapy corrects the anemia and it may correct the neurological complications if given early enough. About 1% of vitamin B12 is absorbed (even in   the absence of intrinsic factor) so some caregivers recommend that elderly patients with gastric atrophy take oral vitamin B12 supplements in addition to monthly injections.   Some symptoms should start to clear up in a few days after treatment begins but other symptoms may take several months.   Additionally, other conditions which may lead to a deficiency should be treated.   Stop drinking alcohol  if alcoholism led to the vitamin B12 deficiency.   For other patients, the vitamin may be taken by mouth or as a nasal gel (or in addition to injections).   Iron supplements may be prescribed.   Avoid taking high amounts of folic acid. It can mask the signs of vitamin B12 deficiency.   Activity may be limited until symptoms improve.   Eat a well-balanced diet.   People on strict vegetarian diets can change their diet or take vitamin B12 supplements for life.  PROGNOSIS   When caught early, the prognosis is good. Most people will do well.   Patients with this illness have a higher incidence of cancer and polyps of the stomach.   Nervous system problems may not improve if treatment does not start soon enough.  Document Released: 08/03/2003 Document Revised: 05/02/2011 Document Reviewed: 05/10/2008 [employer] Patient Information 2012 [employer]. 

## 2011-12-17 LAB — INTRINSIC FACTOR ANTIBODIES: Intrinsic Factor: POSITIVE — AB

## 2011-12-19 NOTE — Assessment & Plan Note (Signed)
Encouraged DASH diet and increased exercise 

## 2011-12-19 NOTE — Assessment & Plan Note (Signed)
Mild, avoid trans fats, start Krill oil caps daily.

## 2011-12-19 NOTE — Assessment & Plan Note (Signed)
Adequately treated.  

## 2011-12-19 NOTE — Assessment & Plan Note (Signed)
Intrinsic factor positive, patient will return to start her vitamin B12 injections

## 2011-12-19 NOTE — Assessment & Plan Note (Addendum)
Minimize sodium elevate feet bid, consider compression hose and may use Lasix prn. Will perform a 2D Echo

## 2011-12-19 NOTE — Progress Notes (Signed)
Patient ID: Tiffany Morris, female   DOB: 09-13-1965, 46 y.o.   MRN: [id number] Tiffany Morris [id number] [birth year] 12/19/2011      Progress Note-Follow Up  Subjective  Chief Complaint  Chief Complaint  Patient presents with  . Follow-up    3 month    HPI  Patient is a 46 year old Caucasian female in today for followup. Her biggest complaint is a recent increase in pedal edema. It is bilateral and his tablet and worse when she's been up and improved somewhat she sleeping. She denies any significant change in her diet but she has just recently come [place] from the beach and was eating a diet high in sodium and fast food. No chest pain, palpitations, shortness of breath, fevers, chills, GI or GU complaints noted today. No recent illness  Past Medical History  Diagnosis Date  . Chicken pox as a child  . Thyroid disease     hyper  . Vitamin B 12 deficiency   . Obesity   . Depression with anxiety 08/05/2011  . hypothyroidism 06/02/2010  . Pain of left breast 08/05/2011  . Pedal edema 08/05/2011  . Hyperlipidemia 09/05/2011    Past Surgical History  Procedure Date  . Cesarean section 2004  . Retinal detachment repair w/ scleral buckle le 01-25-10  . Cholecystectomy     laparoscopic    Family History  Problem Relation Age of Onset  . Hyperlipidemia Mother   . Diabetes Mother     borderline  . Hyperlipidemia Father   . Hypertension Father   . Diabetes Father     type 2  . Hyperthyroidism Father   . Obesity Father   . Cancer Maternal Grandmother     lung- smoker  . COPD Maternal Grandfather     smoked  . Cancer Maternal Grandfather     bladder  . Heart disease Paternal Grandmother     CHF, MI  . Hypothyroidism Paternal Grandmother   . Hypertension Paternal Grandmother   . Hypertension Paternal Grandfather   . Heart disease Paternal Grandfather     open heart surgery  . Hyperlipidemia Paternal Grandfather   . Macular degeneration Paternal Grandfather      History   Social History  . Marital Status: Married    Spouse Name: N/A    Number of Children: N/A  . Years of Education: N/A   Occupational History  . Not on file.   Social History Main Topics  . Smoking status: Never Smoker   . Smokeless tobacco: Never Used  . Alcohol Use: No  . Drug Use: No  . Sexually Active: Not Currently   Other Topics Concern  . Not on file   Social History Narrative  . No narrative on file    Current Outpatient Prescriptions on File Prior to Visit  Medication Sig Dispense Refill  . Cyanocobalamin (VITAMIN B-12 CR) 1500 MCG TBCR Take 1 tablet by mouth daily.      Marland Kitchen levonorgestrel (MIRENA) 20 MCG/24HR IUD 1 each by Intrauterine route once.      . multivitamin (THERAGRAN) per tablet Take 1 tablet by mouth daily.        No Known Allergies  Review of Systems  Review of Systems  Constitutional: Negative for fever and malaise/fatigue.  HENT: Negative for congestion.   Eyes: Negative for discharge.  Respiratory: Negative for shortness of breath.   Cardiovascular: Positive for leg swelling. Negative for chest pain and palpitations.  Gastrointestinal: Negative for nausea, abdominal pain and diarrhea.  Genitourinary: Negative for dysuria.  Musculoskeletal: Negative for falls.  Skin: Negative for rash.  Neurological: Negative for loss of consciousness and headaches.  Endo/Heme/Allergies: Negative for polydipsia.  Psychiatric/Behavioral: Negative for depression and suicidal ideas. The patient is not nervous/anxious and does not have insomnia.     Objective  BP 120/82  Pulse 81  Temp 97 F (36.1 C) (Temporal)  Ht 5\' 5"  (1.651 m)  Wt 250 lb 12.8 oz (113.762 kg)  BMI 41.74 kg/m2  SpO2 98%  Physical Exam  Physical Exam  Constitutional: She is oriented to person, place, and time and well-developed, well-nourished, and in no distress. No distress.  HENT:  Head: Normocephalic and atraumatic.  Eyes: Conjunctivae are normal.  Neck: Neck  supple. No thyromegaly present.  Cardiovascular: Normal rate, regular rhythm and normal heart sounds.   No murmur heard. Pulmonary/Chest: Effort normal and breath sounds normal. She has no wheezes.  Abdominal: She exhibits no distension and no mass.  Musculoskeletal: She exhibits edema.       Trace pedal edema  Lymphadenopathy:    She has no cervical adenopathy.  Neurological: She is alert and oriented to person, place, and time.  Skin: Skin is warm and dry. No rash noted. She is not diaphoretic.  Psychiatric: Memory, affect and judgment normal.    Lab Results  Component Value Date   TSH 1.05 12/16/2011   Lab Results  Component Value Date   WBC 7.8 08/06/2011   HGB 14.1 08/06/2011   HCT 42.4 08/06/2011   MCV 88.6 08/06/2011   PLT 248.0 08/06/2011   Lab Results  Component Value Date   CREATININE 0.8 08/06/2011   BUN 9 08/06/2011   NA 135 08/06/2011   K 3.8 08/06/2011   CL 101 08/06/2011   CO2 26 08/06/2011   Lab Results  Component Value Date   ALT 19 08/06/2011   AST 18 08/06/2011   ALKPHOS 54 08/06/2011   BILITOT 0.2* 08/06/2011   Lab Results  Component Value Date   CHOL 203* 08/06/2011   Lab Results  Component Value Date   HDL 44.40 08/06/2011   No results found for this basename: [facility]   Lab Results  Component Value Date   TRIG 106.0 08/06/2011   Lab Results  Component Value Date   CHOLHDL 5 08/06/2011     Assessment & Plan  Vitamin B 12 deficiency Intrinsic factor positive, patient will return to start her vitamin B12 injections  Hyperlipidemia Mild, avoid trans fats, start Krill oil caps daily.  Pedal edema Minimize sodium elevate feet bid, consider compression hose and may use Lasix prn. Will perform a 2D Echo  hypothyroidism Adequately treated  Obesity Encouraged DASH diet and increased exercise

## 2011-12-24 ENCOUNTER — Telehealth: Payer: Self-pay

## 2011-12-24 NOTE — Progress Notes (Signed)
We gave pt a shot during her appt. Is pt going to be doing these weekly, monthly? Please advise?

## 2011-12-24 NOTE — Telephone Encounter (Signed)
Pt received a B12 injection during her appt so when would you like her to get her next injection? Weekly, Monthly

## 2011-12-24 NOTE — Telephone Encounter (Signed)
So have her do weekly vitamin B12 injections for the next 4 weeks and then monthly after that each

## 2011-12-25 NOTE — Telephone Encounter (Signed)
Pt informed and appt scheduled.

## 2011-12-26 ENCOUNTER — Ambulatory Visit (INDEPENDENT_AMBULATORY_CARE_PROVIDER_SITE_OTHER): Payer: 59

## 2011-12-26 DIAGNOSIS — D510 Vitamin B12 deficiency anemia due to intrinsic factor deficiency: Secondary | ICD-10-CM

## 2011-12-26 MED ORDER — CYANOCOBALAMIN 1000 MCG/ML IJ SOLN
1000.0000 ug | Freq: Once | INTRAMUSCULAR | Status: AC
  Administered 2011-12-26: 1000 ug via INTRAMUSCULAR

## 2011-12-26 NOTE — Progress Notes (Signed)
  Subjective:    Patient ID: Tiffany Morris, female    DOB: [birth year], 46 y.o.   MRN: [id number]  HPI    Review of Systems     Objective:   Physical Exam        Assessment & Plan:  Patient came in for a B12 injection. Pt handled injection good

## 2012-01-08 ENCOUNTER — Ambulatory Visit (INDEPENDENT_AMBULATORY_CARE_PROVIDER_SITE_OTHER): Payer: 59

## 2012-01-08 DIAGNOSIS — E538 Deficiency of other specified B group vitamins: Secondary | ICD-10-CM

## 2012-01-08 MED ORDER — CYANOCOBALAMIN 1000 MCG/ML IJ SOLN
1000.0000 ug | Freq: Once | INTRAMUSCULAR | Status: AC
  Administered 2012-01-08: 1000 ug via INTRAMUSCULAR

## 2012-01-08 NOTE — Progress Notes (Signed)
  Subjective:    Patient ID: Tiffany Morris, female    DOB: [birth year], 46 y.o.   MRN: [id number]  HPI    Review of Systems     Objective:   Physical Exam        Assessment & Plan:  Patient came in for a B12 injection. Patient tolerated injection well

## 2012-01-15 ENCOUNTER — Ambulatory Visit (INDEPENDENT_AMBULATORY_CARE_PROVIDER_SITE_OTHER): Payer: 59

## 2012-01-15 DIAGNOSIS — E538 Deficiency of other specified B group vitamins: Secondary | ICD-10-CM

## 2012-01-15 MED ORDER — CYANOCOBALAMIN 1000 MCG/ML IJ SOLN
1000.0000 ug | Freq: Once | INTRAMUSCULAR | Status: AC
  Administered 2012-01-15: 1000 ug via INTRAMUSCULAR

## 2012-01-15 NOTE — Progress Notes (Signed)
  Subjective:    Patient ID: Tiffany Morris, female    DOB: 1966/05/16, 46 y.o.   MRN: [id number]  HPI    Review of Systems     Objective:   Physical Exam        Assessment & Plan:  Pt came in for her b12 injection. Patient tolerated injection well

## 2012-02-12 ENCOUNTER — Ambulatory Visit: Payer: 59

## 2012-02-19 ENCOUNTER — Ambulatory Visit (INDEPENDENT_AMBULATORY_CARE_PROVIDER_SITE_OTHER): Payer: 59

## 2012-02-19 DIAGNOSIS — E538 Deficiency of other specified B group vitamins: Secondary | ICD-10-CM

## 2012-02-19 MED ORDER — CYANOCOBALAMIN 1000 MCG/ML IJ SOLN
1000.0000 ug | Freq: Once | INTRAMUSCULAR | Status: AC
  Administered 2012-02-19: 1000 ug via INTRAMUSCULAR

## 2012-02-19 NOTE — Progress Notes (Signed)
  Subjective:    Patient ID: Tiffany Morris, female    DOB: Aug 19, 1965, 46 y.o.   MRN: [id number]  HPI    Review of Systems     Objective:   Physical Exam        Assessment & Plan:  Patient came in for a B12 injection. Pt tolerated shot well

## 2012-02-26 ENCOUNTER — Encounter: Payer: Self-pay | Admitting: Family Medicine

## 2012-02-26 NOTE — Telephone Encounter (Signed)
Please advise mychart question? 

## 2012-02-27 ENCOUNTER — Other Ambulatory Visit: Payer: Self-pay | Admitting: Family Medicine

## 2012-02-27 DIAGNOSIS — E039 Hypothyroidism, unspecified: Secondary | ICD-10-CM

## 2012-02-27 MED ORDER — LEVOTHYROXINE SODIUM 150 MCG PO TABS: 150.0000 ug | ORAL_TABLET | Freq: Every day | ORAL | Status: DC

## 2012-03-11 ENCOUNTER — Ambulatory Visit: Payer: 59

## 2012-03-17 ENCOUNTER — Ambulatory Visit (INDEPENDENT_AMBULATORY_CARE_PROVIDER_SITE_OTHER): Payer: 59

## 2012-03-17 DIAGNOSIS — E538 Deficiency of other specified B group vitamins: Secondary | ICD-10-CM

## 2012-03-17 MED ORDER — CYANOCOBALAMIN 1000 MCG/ML IJ SOLN
1000.0000 ug | Freq: Once | INTRAMUSCULAR | Status: AC
  Administered 2012-03-17: 1000 ug via INTRAMUSCULAR

## 2012-03-17 NOTE — Progress Notes (Signed)
  Subjective:    Patient ID: Tiffany Morris, female    DOB: 16-Apr-1966, 46 y.o.   MRN: [id number]  HPI    Review of Systems     Objective:   Physical Exam        Assessment & Plan:  Patient came in today for her B12 injection. Patient tolerated injection well

## 2012-04-22 ENCOUNTER — Ambulatory Visit: Payer: 59

## 2012-05-14 ENCOUNTER — Encounter: Payer: Self-pay | Admitting: Family Medicine

## 2012-05-14 NOTE — Telephone Encounter (Signed)
Please advise mychart question? 

## 2012-05-15 MED ORDER — CYANOCOBALAMIN 1000 MCG/ML IJ SOLN: 1000.0000 ug | Freq: Once | INTRAMUSCULAR | Status: DC

## 2012-05-27 HISTORY — PX: COLONOSCOPY: SHX174

## 2012-06-08 ENCOUNTER — Encounter: Payer: Self-pay | Admitting: Family Medicine

## 2012-06-18 ENCOUNTER — Ambulatory Visit: Payer: 59 | Admitting: Family Medicine

## 2012-06-18 ENCOUNTER — Ambulatory Visit (INDEPENDENT_AMBULATORY_CARE_PROVIDER_SITE_OTHER): Payer: 59 | Admitting: Family Medicine

## 2012-06-18 ENCOUNTER — Encounter: Payer: Self-pay | Admitting: Family Medicine

## 2012-06-18 VITALS — BP 110/90 | HR 89 | Temp 98.2°F | Ht 65.0 in | Wt 255.0 lb

## 2012-06-18 DIAGNOSIS — R609 Edema, unspecified: Secondary | ICD-10-CM

## 2012-06-18 DIAGNOSIS — E039 Hypothyroidism, unspecified: Secondary | ICD-10-CM

## 2012-06-18 DIAGNOSIS — E0590 Thyrotoxicosis, unspecified without thyrotoxic crisis or storm: Secondary | ICD-10-CM

## 2012-06-18 DIAGNOSIS — E538 Deficiency of other specified B group vitamins: Secondary | ICD-10-CM

## 2012-06-18 DIAGNOSIS — F341 Dysthymic disorder: Secondary | ICD-10-CM

## 2012-06-18 DIAGNOSIS — F418 Other specified anxiety disorders: Secondary | ICD-10-CM

## 2012-06-18 DIAGNOSIS — R600 Localized edema: Secondary | ICD-10-CM

## 2012-06-18 LAB — TSH: TSH: 2.231 u[IU]/mL (ref 0.350–4.500)

## 2012-06-21 NOTE — Assessment & Plan Note (Signed)
Doing well without meds at thist ime.

## 2012-06-21 NOTE — Assessment & Plan Note (Signed)
Labs stable, continue current dose of Levothyroxine.

## 2012-06-21 NOTE — Progress Notes (Signed)
Patient ID: Tiffany Morris, female   DOB: 1966-03-12, 47 y.o.   MRN: [id number] SRI CLEGG [id number] 1966/05/05 06/21/2012      Progress Note-Follow Up  Subjective  Chief Complaint  Chief Complaint  Patient presents with  . Follow-up    6 month    HPI  Patient is a 47 year old Caucasian female is here today for six-month followup. Overall she feels she's doing well although she does acknowledge she had significant increased stress at the moment type injury to just changing jobs. She feels oh, down. No other recent illness. No fevers, chills, chest pain or palpitations, shortness of breath, GI or GU complaints. Her pedal edema is improving. She overall feels she is tolerating her stress and is not in need of treatment at this time.  Past Medical History  Diagnosis Date  . Chicken pox as a child  . Thyroid disease     hyper  . Vitamin B 12 deficiency   . Obesity   . Depression with anxiety 08/05/2011  . hypothyroidism 06/02/2010  . Pain of left breast 08/05/2011  . Pedal edema 08/05/2011  . Hyperlipidemia 09/05/2011    Past Surgical History  Procedure Date  . Cesarean section 2004  . Retinal detachment repair w/ scleral buckle le 01-25-10  . Cholecystectomy     laparoscopic    Family History  Problem Relation Age of Onset  . Hyperlipidemia Mother   . Diabetes Mother     borderline  . Hyperlipidemia Father   . Hypertension Father   . Diabetes Father     type 2  . Hyperthyroidism Father   . Obesity Father   . Cancer Maternal Grandmother     lung- smoker  . COPD Maternal Grandfather     smoked  . Cancer Maternal Grandfather     bladder  . Heart disease Paternal Grandmother     CHF, MI  . Hypothyroidism Paternal Grandmother   . Hypertension Paternal Grandmother   . Hypertension Paternal Grandfather   . Heart disease Paternal Grandfather     open heart surgery  . Hyperlipidemia Paternal Grandfather   . Macular degeneration Paternal Grandfather      History   Social History  . Marital Status: Married    Spouse Name: N/A    Number of Children: N/A  . Years of Education: N/A   Occupational History  . Not on file.   Social History Main Topics  . Smoking status: Never Smoker   . Smokeless tobacco: Never Used  . Alcohol Use: No  . Drug Use: No  . Sexually Active: Not Currently   Other Topics Concern  . Not on file   Social History Narrative  . No narrative on file    Current Outpatient Prescriptions on File Prior to Visit  Medication Sig Dispense Refill  . cyanocobalamin (,VITAMIN B-12,) 1000 MCG/ML injection Inject 1 mL (1,000 mcg total) into the muscle once.  10 mL  3  . levonorgestrel (MIRENA) 20 MCG/24HR IUD 1 each by Intrauterine route once.      Marland Kitchen levothyroxine (SYNTHROID, LEVOTHROID) 150 MCG tablet Take 1 tablet (150 mcg total) by mouth daily.  90 tablet  0  . multivitamin (THERAGRAN) per tablet Take 1 tablet by mouth daily.      . sertraline (ZOLOFT) 100 MG tablet Take 1 tablet (100 mg total) by mouth daily.  90 tablet  3    No Known Allergies  Review of Systems  Review of Systems  Constitutional: Negative  for fever and malaise/fatigue.  HENT: Negative for congestion.   Eyes: Negative for discharge.  Respiratory: Negative for shortness of breath.   Cardiovascular: Negative for chest pain, palpitations and leg swelling.  Gastrointestinal: Negative for nausea, abdominal pain and diarrhea.  Genitourinary: Negative for dysuria.  Musculoskeletal: Negative for falls.  Skin: Negative for rash.  Neurological: Negative for loss of consciousness and headaches.  Endo/Heme/Allergies: Negative for polydipsia.  Psychiatric/Behavioral: Negative for depression and suicidal ideas. The patient is not nervous/anxious and does not have insomnia.     Objective  BP 110/90  Pulse 89  Temp 98.2 F (36.8 C) (Oral)  Ht 5\' 5"  (1.651 m)  Wt 255 lb 0.6 oz (115.685 kg)  BMI 42.44 kg/m2  SpO2 95%  Physical  Exam  Physical Exam  Constitutional: She is oriented to person, place, and time and well-developed, well-nourished, and in no distress. No distress.  HENT:  Head: Normocephalic and atraumatic.  Eyes: Conjunctivae normal are normal.  Neck: Neck supple. No thyromegaly present.  Cardiovascular: Normal rate, regular rhythm and normal heart sounds.   No murmur heard. Pulmonary/Chest: Effort normal and breath sounds normal. She has no wheezes.  Abdominal: She exhibits no distension and no mass.  Musculoskeletal: She exhibits no edema.  Lymphadenopathy:    She has no cervical adenopathy.  Neurological: She is alert and oriented to person, place, and time.  Skin: Skin is warm and dry. No rash noted. She is not diaphoretic.  Psychiatric: Memory, affect and judgment normal.    Lab Results  Component Value Date   TSH 2.231 06/18/2012   Lab Results  Component Value Date   WBC 7.8 08/06/2011   HGB 14.1 08/06/2011   HCT 42.4 08/06/2011   MCV 88.6 08/06/2011   PLT 248.0 08/06/2011   Lab Results  Component Value Date   CREATININE 0.8 08/06/2011   BUN 9 08/06/2011   NA 135 08/06/2011   K 3.8 08/06/2011   CL 101 08/06/2011   CO2 26 08/06/2011   Lab Results  Component Value Date   ALT 19 08/06/2011   AST 18 08/06/2011   ALKPHOS 54 08/06/2011   BILITOT 0.2* 08/06/2011   Lab Results  Component Value Date   CHOL 203* 08/06/2011   Lab Results  Component Value Date   HDL 44.40 08/06/2011   No results found for this basename: [facility]   Lab Results  Component Value Date   TRIG 106.0 08/06/2011   Lab Results  Component Value Date   CHOLHDL 5 08/06/2011     Assessment & Plan  hypothyroidism Labs stable, continue current dose of Levothyroxine.  Depression with anxiety Doing well without meds at this time  Pedal edema No recent episodes  Vitamin B 12 deficiency Tolerating monthly shots of Vitamin B 12

## 2012-06-21 NOTE — Assessment & Plan Note (Signed)
No recent episodes

## 2012-06-21 NOTE — Assessment & Plan Note (Signed)
Tolerating monthly shots of Vitamin B 12

## 2012-07-07 ENCOUNTER — Encounter: Payer: Self-pay | Admitting: Family Medicine

## 2012-07-07 DIAGNOSIS — E039 Hypothyroidism, unspecified: Secondary | ICD-10-CM

## 2012-07-08 ENCOUNTER — Encounter: Payer: Self-pay | Admitting: Family Medicine

## 2012-07-08 MED ORDER — LEVOTHYROXINE SODIUM 150 MCG PO TABS: 150.0000 ug | ORAL_TABLET | Freq: Every day | ORAL | Status: DC

## 2012-07-11 ENCOUNTER — Other Ambulatory Visit: Payer: Self-pay

## 2012-08-24 ENCOUNTER — Encounter: Payer: Self-pay | Admitting: Family Medicine

## 2012-08-24 MED ORDER — CYANOCOBALAMIN 1000 MCG/ML IJ SOLN: 1000.0000 ug | Freq: Once | INTRAMUSCULAR | Status: DC

## 2012-08-24 NOTE — Telephone Encounter (Signed)
I no longer work for [employer], so I can't use their outpatient pharmacy. If you know of a cheaper place to purchase the B12 injections, please let me know.  Do you have any suggestions/recommendations for patient/SLS

## 2012-08-24 NOTE — Telephone Encounter (Signed)
Reply message sent to patient: I have your Rx refill pending and tried to reach you at your home & work number unsuccessfully to get clarification as to which [facility] you would like your Rx sent to, as we do not have this on file in your medical records. We will send this Rx for you when we get the needed information to complete refill request. Thank you/SLS

## 2012-10-20 ENCOUNTER — Telehealth: Payer: Self-pay

## 2012-10-20 NOTE — Telephone Encounter (Signed)
FYI: Pharmacist from [facility] called stating that they had a manufacturer change in Levothyroxine and needed approval for dispense. I informed pharmacist this was ok and to inform pt when medication was picked up that she will need to come into the office for labs in 8 weeks

## 2013-01-01 ENCOUNTER — Other Ambulatory Visit: Payer: Self-pay | Admitting: *Deleted

## 2013-01-01 DIAGNOSIS — F329 Major depressive disorder, single episode, unspecified: Secondary | ICD-10-CM

## 2013-01-01 DIAGNOSIS — F32A Depression, unspecified: Secondary | ICD-10-CM

## 2013-01-01 MED ORDER — SERTRALINE HCL 100 MG PO TABS: 100.0000 mg | ORAL_TABLET | Freq: Every day | ORAL | Status: DC

## 2013-01-01 NOTE — Progress Notes (Signed)
Refill sent per [facility] refill protocol to new pharmacy, [facility]/SLS

## 2013-02-14 ENCOUNTER — Encounter: Payer: Self-pay | Admitting: Family Medicine

## 2013-02-14 DIAGNOSIS — E039 Hypothyroidism, unspecified: Secondary | ICD-10-CM

## 2013-02-15 NOTE — Telephone Encounter (Signed)
Please advise 

## 2013-02-16 MED ORDER — LEVOTHYROXINE SODIUM 150 MCG PO TABS: 150.0000 ug | ORAL_TABLET | Freq: Every day | ORAL | Status: DC

## 2013-02-16 NOTE — Telephone Encounter (Signed)
RX sent

## 2013-04-01 ENCOUNTER — Other Ambulatory Visit: Payer: Self-pay

## 2013-04-27 ENCOUNTER — Telehealth: Payer: Self-pay

## 2013-04-27 ENCOUNTER — Other Ambulatory Visit (INDEPENDENT_AMBULATORY_CARE_PROVIDER_SITE_OTHER): Payer: 59

## 2013-04-27 DIAGNOSIS — E0590 Thyrotoxicosis, unspecified without thyrotoxic crisis or storm: Secondary | ICD-10-CM

## 2013-04-27 LAB — TSH: TSH: 1.98 u[IU]/mL (ref 0.35–5.50)

## 2013-04-27 NOTE — Telephone Encounter (Signed)
Lab order placed for [facility]

## 2013-04-30 ENCOUNTER — Telehealth: Payer: Self-pay | Admitting: Family Medicine

## 2013-04-30 DIAGNOSIS — E039 Hypothyroidism, unspecified: Secondary | ICD-10-CM

## 2013-04-30 MED ORDER — LEVOTHYROXINE SODIUM 150 MCG PO TABS: 150.0000 ug | ORAL_TABLET | Freq: Every day | ORAL | Status: DC

## 2013-04-30 NOTE — Telephone Encounter (Signed)
levothyroxin 150 mcg tab qty 90 take 1 tablet by mouth once daily

## 2013-05-28 ENCOUNTER — Other Ambulatory Visit: Payer: Self-pay | Admitting: *Deleted

## 2013-06-07 ENCOUNTER — Other Ambulatory Visit: Payer: Self-pay | Admitting: Family Medicine

## 2013-06-25 ENCOUNTER — Other Ambulatory Visit: Payer: Self-pay | Admitting: Family Medicine

## 2013-06-25 NOTE — Telephone Encounter (Signed)
Rx request to pharmacy/SLS  

## 2013-06-26 ENCOUNTER — Other Ambulatory Visit: Payer: Self-pay | Admitting: Family Medicine

## 2013-06-28 MED ORDER — LEVOTHYROXINE SODIUM 150 MCG PO TABS: 150.0000 ug | ORAL_TABLET | Freq: Every day | ORAL | Status: DC

## 2013-07-29 ENCOUNTER — Ambulatory Visit: Payer: 59 | Admitting: Family Medicine

## 2013-08-03 ENCOUNTER — Ambulatory Visit (HOSPITAL_BASED_OUTPATIENT_CLINIC_OR_DEPARTMENT_OTHER): Payer: 59 | Attending: Internal Medicine | Admitting: Radiology

## 2013-08-03 VITALS — Ht 65.0 in | Wt 258.0 lb

## 2013-08-03 DIAGNOSIS — G4733 Obstructive sleep apnea (adult) (pediatric): Secondary | ICD-10-CM | POA: Insufficient documentation

## 2013-08-06 ENCOUNTER — Ambulatory Visit (HOSPITAL_BASED_OUTPATIENT_CLINIC_OR_DEPARTMENT_OTHER): Payer: 59

## 2013-08-07 DIAGNOSIS — G4733 Obstructive sleep apnea (adult) (pediatric): Secondary | ICD-10-CM

## 2013-08-07 NOTE — Sleep Study (Signed)
   NAME: Tiffany Morris DATE OF BIRTH:  [birth year] MEDICAL RECORD NUMBER [id number]  LOCATION: [facility]  PHYSICIAN: Reynol Arnone D  DATE OF STUDY: 08/03/2013  SLEEP STUDY TYPE: Nocturnal Polysomnogram               REFERRING PHYSICIAN: Elisabeth MostStevenson, Amy, DO  INDICATION FOR STUDY: Hypersomnia with sleep apnea  EPWORTH SLEEPINESS SCORE:   HEIGHT: 5\' 5"  (165.1 cm)  WEIGHT: 258 lb (117.028 kg)    Body mass index is 42.93 kg/(m^2).  NECK SIZE: 15 in.  MEDICATIONS: Charted for review  SLEEP ARCHITECTURE: A split study protocol. During the diagnostic phase, total sleep time 121.5 minutes with sleep efficiency 80.2%. Stage I was 7%, stage II 76.5%, stage III 3.7%, REM 12.8% of total sleep time. Sleep latency 20.5 minutes, REM latency 106 minutes, awake after sleep onset 9.5 minutes, arousal index 18.8. Bedtime medication: None.  RESPIRATORY DATA: Apnea hypopnea index (AHI) 24.2 per hour. 49 total events scored including 6 obstructive apneas and 43 hypopneas. Events were not positional. REM AHI 46.5 per hour. CPAP was titrated to 6 CWP, AHI 0 per hour. She wore a small ResMed air fit F. 10 fullface mask with heated humidifier  OXYGEN DATA: Moderate snoring before CPAP with oxygen desaturation to a nadir of 84% on room air. With CPAP control, snoring was prevented and mean oxygen saturation of 94.5% on room air.  CARDIAC DATA: Normal sinus rhythm  MOVEMENT/PARASOMNIA: No significant movement disturbance. Bathroom x1  IMPRESSION/ RECOMMENDATION:   1) Moderate obstructive sleep apnea/hypopneas syndrome, AHI 24.2 per hour with non-positional events. REM AHI 46.5 per hour. Moderate snoring with oxygen desaturation to a nadir of 84% on room air. 2) Successful CPAP titration to 6 CWP, AHI 0 per hour. She wore a small ResMed air fit F. 10 fullface mask with heated humidifier. Snoring was prevented and mean oxygen saturation of 94.5% on room air with CPAP.  Signed Jetty Duhamellinton  Daveena Elmore M.D. Waymon BudgeYOUNG,Eldred Sooy D Diplomate, American Board of Sleep Medicine  ELECTRONICALLY SIGNED ON:  08/07/2013, 12:48 PM [facility] PH: [phone]   FX: [phone] ACCREDITED BY THE AMERICAN ACADEMY OF SLEEP MEDICINE

## 2013-08-09 ENCOUNTER — Other Ambulatory Visit: Payer: Self-pay | Admitting: Family Medicine

## 2013-08-09 DIAGNOSIS — G4730 Sleep apnea, unspecified: Secondary | ICD-10-CM

## 2013-08-13 ENCOUNTER — Encounter: Payer: Self-pay | Admitting: Family Medicine

## 2013-08-13 NOTE — Telephone Encounter (Signed)
Can you please look into her pulmonary referral? She sent me a mychart message asking if I scheduled the appt?

## 2013-08-20 ENCOUNTER — Encounter: Payer: Self-pay | Admitting: Family Medicine

## 2013-08-20 ENCOUNTER — Ambulatory Visit (INDEPENDENT_AMBULATORY_CARE_PROVIDER_SITE_OTHER): Payer: 59 | Admitting: Family Medicine

## 2013-08-20 ENCOUNTER — Telehealth: Payer: Self-pay | Admitting: Family Medicine

## 2013-08-20 VITALS — BP 118/90 | HR 82 | Temp 98.1°F | Ht 65.0 in | Wt 258.1 lb

## 2013-08-20 DIAGNOSIS — Z0000 Encounter for general adult medical examination without abnormal findings: Secondary | ICD-10-CM

## 2013-08-20 DIAGNOSIS — E785 Hyperlipidemia, unspecified: Secondary | ICD-10-CM

## 2013-08-20 DIAGNOSIS — E0590 Thyrotoxicosis, unspecified without thyrotoxic crisis or storm: Secondary | ICD-10-CM

## 2013-08-20 DIAGNOSIS — E079 Disorder of thyroid, unspecified: Secondary | ICD-10-CM

## 2013-08-20 DIAGNOSIS — D649 Anemia, unspecified: Secondary | ICD-10-CM

## 2013-08-20 DIAGNOSIS — E669 Obesity, unspecified: Secondary | ICD-10-CM

## 2013-08-20 DIAGNOSIS — G4730 Sleep apnea, unspecified: Secondary | ICD-10-CM

## 2013-08-20 DIAGNOSIS — E538 Deficiency of other specified B group vitamins: Secondary | ICD-10-CM

## 2013-08-20 HISTORY — DX: Sleep apnea, unspecified: G47.30

## 2013-08-20 LAB — CBC
HEMATOCRIT: 41.8 % (ref 36.0–46.0)
Hemoglobin: 14.3 g/dL (ref 12.0–15.0)
MCH: 29.4 pg (ref 26.0–34.0)
MCHC: 34.2 g/dL (ref 30.0–36.0)
MCV: 86 fL (ref 78.0–100.0)
PLATELETS: 268 10*3/uL (ref 150–400)
RBC: 4.86 MIL/uL (ref 3.87–5.11)
RDW: 13.1 % (ref 11.5–15.5)
WBC: 8.2 10*3/uL (ref 4.0–10.5)

## 2013-08-20 LAB — RENAL FUNCTION PANEL
ALBUMIN: 3.9 g/dL (ref 3.5–5.2)
BUN: 10 mg/dL (ref 6–23)
CALCIUM: 9.1 mg/dL (ref 8.4–10.5)
CO2: 28 mEq/L (ref 19–32)
Chloride: 103 mEq/L (ref 96–112)
Creat: 0.85 mg/dL (ref 0.50–1.10)
Glucose, Bld: 107 mg/dL — ABNORMAL HIGH (ref 70–99)
PHOSPHORUS: 3.1 mg/dL (ref 2.3–4.6)
Potassium: 3.7 mEq/L (ref 3.5–5.3)
Sodium: 138 mEq/L (ref 135–145)

## 2013-08-20 LAB — LIPID PANEL
Cholesterol: 191 mg/dL (ref 0–200)
HDL: 40 mg/dL (ref >39)
LDL Cholesterol: 131 mg/dL — ABNORMAL HIGH (ref 0–99)
TRIGLYCERIDES: 100 mg/dL (ref <150)
Total CHOL/HDL Ratio: 4.8 Ratio
VLDL: 20 mg/dL (ref 0–40)

## 2013-08-20 LAB — HEPATIC FUNCTION PANEL
ALK PHOS: 54 U/L (ref 39–117)
ALT: 20 U/L (ref 0–35)
AST: 17 U/L (ref 0–37)
Albumin: 3.9 g/dL (ref 3.5–5.2)
BILIRUBIN TOTAL: 0.5 mg/dL (ref 0.2–1.2)
Bilirubin, Direct: 0.1 mg/dL (ref 0.0–0.3)
Indirect Bilirubin: 0.4 mg/dL (ref 0.2–1.2)
Total Protein: 7.3 g/dL (ref 6.0–8.3)

## 2013-08-20 MED ORDER — CYANOCOBALAMIN 1000 MCG/ML IJ SOLN: 1000.0000 ug | Freq: Once | INTRAMUSCULAR | Status: DC

## 2013-08-20 MED ORDER — LEVOTHYROXINE SODIUM 150 MCG PO TABS: 150.0000 ug | ORAL_TABLET | Freq: Every day | ORAL | Status: DC

## 2013-08-20 NOTE — Patient Instructions (Signed)

## 2013-08-20 NOTE — Progress Notes (Signed)
Pre visit review using our clinic review tool, if applicable. No additional management support is needed unless otherwise documented below in the visit note. 

## 2013-08-20 NOTE — Telephone Encounter (Signed)
[facility] labs  [facility] be going [facility]

## 2013-08-21 LAB — TSH: TSH: 1.22 u[IU]/mL (ref 0.350–4.500)

## 2013-08-21 LAB — VITAMIN B12: Vitamin B-12: 562 pg/mL (ref 211–911)

## 2013-08-22 ENCOUNTER — Encounter: Payer: Self-pay | Admitting: Family Medicine

## 2013-08-22 NOTE — Assessment & Plan Note (Addendum)
On Levothyroxine, continue to monitor, given refills today

## 2013-08-22 NOTE — Assessment & Plan Note (Signed)
Encouraged DASH diet, decrease po intake and increase exercise as tolerated. Needs 7-8 hours of sleep nightly. Avoid trans fats, eat small, frequent meals every 4-5 hours with lean proteins, complex carbs and healthy fats. Minimize simple carbs, GMO foods. 

## 2013-08-22 NOTE — Progress Notes (Signed)
Patient ID: Tiffany Morris, female   DOB: [birth year], 48 y.o.   MRN: [id number] Tiffany GuestKimberly G Morris [id number] [birth year] 08/22/2013      Progress Note-Follow Up  Subjective  Chief Complaint  Chief Complaint  Patient presents with  . Follow-up    med refill    HPI  Patient is a 48 year old female in today for routine medical care. She is generally doing well she just needs a refill on medications. No recent illness. No trips in the emergency room. Denies CP/palp/SOB/HA/congestion/fevers/GI or GU c/o. Taking meds as prescribed  Past Medical History  Diagnosis Date  . Chicken pox as a child  . Thyroid disease     hyper  . Vitamin B 12 deficiency   . Obesity   . Depression with anxiety 08/05/2011  . hypothyroidism 06/02/2010  . Pain of left breast 08/05/2011  . Pedal edema 08/05/2011  . Hyperlipidemia 09/05/2011  . Unspecified sleep apnea 08/20/2013    Past Surgical History  Procedure Laterality Date  . Cesarean section  2004  . Retinal detachment repair w/ scleral buckle le  01-25-10  . Cholecystectomy      laparoscopic    Family History  Problem Relation Age of Onset  . Hyperlipidemia Mother   . Diabetes Mother     borderline  . Hyperlipidemia Father   . Hypertension Father   . Diabetes Father     type 2  . Hyperthyroidism Father   . Obesity Father   . Cancer Maternal Grandmother     lung- smoker  . COPD Maternal Grandfather     smoked  . Cancer Maternal Grandfather     bladder  . Heart disease Paternal Grandmother     CHF, MI  . Hypothyroidism Paternal Grandmother   . Hypertension Paternal Grandmother   . Hypertension Paternal Grandfather   . Heart disease Paternal Grandfather     open heart surgery  . Hyperlipidemia Paternal Grandfather   . Macular degeneration Paternal Grandfather     History   Social History  . Marital Status: Married    Spouse Name: N/A    Number of Children: N/A  . Years of Education: N/A   Occupational History  . Not on  file.   Social History Main Topics  . Smoking status: Never Smoker   . Smokeless tobacco: Never Used  . Alcohol Use: No  . Drug Use: No  . Sexual Activity: Not Currently   Other Topics Concern  . Not on file   Social History Narrative  . No narrative on file    Current Outpatient Prescriptions on File Prior to Visit  Medication Sig Dispense Refill  . levonorgestrel (MIRENA) 20 MCG/24HR IUD 1 each by Intrauterine route once.       No current facility-administered medications on file prior to visit.    No Known Allergies  Review of Systems  Review of Systems  Constitutional: Negative for fever and malaise/fatigue.  HENT: Negative for congestion.   Eyes: Negative for discharge.  Respiratory: Negative for shortness of breath.   Cardiovascular: Negative for chest pain, palpitations and leg swelling.  Gastrointestinal: Negative for nausea, abdominal pain and diarrhea.  Genitourinary: Negative for dysuria.  Musculoskeletal: Negative for falls.  Skin: Negative for rash.  Neurological: Negative for loss of consciousness and headaches.  Endo/Heme/Allergies: Negative for polydipsia.  Psychiatric/Behavioral: Negative for depression and suicidal ideas. The patient is not nervous/anxious and does not have insomnia.     Objective  BP 118/90  Pulse  82  Temp(Src) 98.1 F (36.7 C) (Oral)  Ht 5\' 5"  (1.651 m)  Wt 258 lb 1.3 oz (117.064 kg)  BMI 42.95 kg/m2  SpO2 96%  Physical Exam  Physical Exam  Constitutional: She is oriented to person, place, and time and well-developed, well-nourished, and in no distress. No distress.  HENT:  Head: Normocephalic and atraumatic.  Eyes: Conjunctivae are normal.  Neck: Neck supple. No thyromegaly present.  Cardiovascular: Normal rate, regular rhythm and normal heart sounds.   No murmur heard. Pulmonary/Chest: Effort normal and breath sounds normal. She has no wheezes.  Abdominal: She exhibits no distension and no mass.  Musculoskeletal:  She exhibits no edema.  Lymphadenopathy:    She has no cervical adenopathy.  Neurological: She is alert and oriented to person, place, and time.  Skin: Skin is warm and dry. No rash noted. She is not diaphoretic.  Psychiatric: Memory, affect and judgment normal.    Lab Results  Component Value Date   TSH 1.220 08/20/2013   Lab Results  Component Value Date   WBC 8.2 08/20/2013   HGB 14.3 08/20/2013   HCT 41.8 08/20/2013   MCV 86.0 08/20/2013   PLT 268 08/20/2013   Lab Results  Component Value Date   CREATININE 0.85 08/20/2013   BUN 10 08/20/2013   NA 138 08/20/2013   K 3.7 08/20/2013   CL 103 08/20/2013   CO2 28 08/20/2013   Lab Results  Component Value Date   ALT 20 08/20/2013   AST 17 08/20/2013   ALKPHOS 54 08/20/2013   BILITOT 0.5 08/20/2013   Lab Results  Component Value Date   CHOL 191 08/20/2013   Lab Results  Component Value Date   HDL 40 08/20/2013   Lab Results  Component Value Date   LDLCALC 131* 08/20/2013   Lab Results  Component Value Date   TRIG 100 08/20/2013   Lab Results  Component Value Date   CHOLHDL 4.8 08/20/2013     Assessment & Plan  Obesity Encouraged DASH diet, decrease po intake and increase exercise as tolerated. Needs 7-8 hours of sleep nightly. Avoid trans fats, eat small, frequent meals every 4-5 hours with lean proteins, complex carbs and healthy fats. Minimize simple carbs, GMO foods.  hypothyroidism On Levothyroxine, continue to monitor, given refills today  Hyperlipidemia Encouraged heart healthy diet, increase exercise, avoid trans fats, consider a krill oil cap daily  Vitamin B 12 deficiency SNL today, given refills today

## 2013-08-22 NOTE — Assessment & Plan Note (Signed)
SNL today, given refills today

## 2013-08-22 NOTE — Assessment & Plan Note (Signed)
Encouraged heart healthy diet, increase exercise, avoid trans fats, consider a krill oil cap daily 

## 2013-08-23 NOTE — Telephone Encounter (Signed)
Lipid, renal, cbc, tsh, hepatic for annual and hi chol and a hgba1c for hyperglycemia

## 2013-08-23 NOTE — Telephone Encounter (Signed)
Please advise which labs and diagnosis codes

## 2013-08-26 ENCOUNTER — Encounter: Payer: Self-pay | Admitting: Internal Medicine

## 2013-08-30 NOTE — Telephone Encounter (Signed)
Tiffany Morris wants to clarify how often pt uses Vit B12. The Rx states inject into muscle once. From her chart it looks like once a month. Please clarify

## 2013-08-31 NOTE — Telephone Encounter (Signed)
Dr Abner GreenspanBlyth, please advise re: B12 question below.

## 2013-09-01 ENCOUNTER — Telehealth: Payer: Self-pay | Admitting: *Deleted

## 2013-09-01 NOTE — Telephone Encounter (Signed)
Left detailed message on pharmacy voicemail.

## 2013-09-01 NOTE — Telephone Encounter (Signed)
Melissa, can you determine how often pt should be getting b!2 injection?    Kathi Simpersricia A Oisin Yoakum, CMA at 08/31/2013 8:28 AM    Status: Signed       Dr Abner GreenspanBlyth, please advise re: B12 question below.       Cydney Okamara N Augustin, CMA at 08/30/2013 2:13 PM     Status: Signed        [facility] [place] wants to clarify how often pt uses Vit B12. The Rx states inject into muscle once. From her chart it looks like once a month. Please clarify

## 2013-09-01 NOTE — Telephone Encounter (Signed)
Monthly please

## 2013-09-22 ENCOUNTER — Ambulatory Visit (INDEPENDENT_AMBULATORY_CARE_PROVIDER_SITE_OTHER): Payer: 59 | Admitting: Pulmonary Disease

## 2013-09-22 ENCOUNTER — Encounter: Payer: Self-pay | Admitting: Pulmonary Disease

## 2013-09-22 VITALS — BP 120/84 | HR 93 | Temp 98.5°F | Ht 65.0 in | Wt 265.0 lb

## 2013-09-22 DIAGNOSIS — G4733 Obstructive sleep apnea (adult) (pediatric): Secondary | ICD-10-CM | POA: Insufficient documentation

## 2013-09-22 DIAGNOSIS — Z9989 Dependence on other enabling machines and devices: Secondary | ICD-10-CM | POA: Insufficient documentation

## 2013-09-22 NOTE — Patient Instructions (Signed)
Continue with cpap, and keep up with mask changes and supplies. Work on [employer] Will get a download off your machine to make sure everything is ok. Will have advanced enroll you in the [facility] program so we can troubleshoot if you have issues. followup with me again in 6mos, but call if having issues.

## 2013-09-22 NOTE — Progress Notes (Signed)
Subjective:    Patient ID: Tiffany Morris, female    DOB: 01-30-1966, 48 y.o.   MRN: [id number]  HPI The patient is a [age] year old female who I've been asked to see for management of obstructive sleep apnea. She was diagnosed by sleep study last month to have moderate OSA, with an AHI of 24 of events per hour. She was started on CPAP as part of a split night protocol, and titrated to an optimal pressure of 6 cm of water. Prior to the CPAP, and the patient was having issues with loud snoring and nonrestorative sleep. She also had significant daytime sleepiness, and often have to take a nap in the late afternoons. She has been started on CPAP for the last 2 weeks, and is using a full face mask as well as a heated humidifier. Her machine is set on the 6 cm determined from her study. She feels that she is sleeping much better at night, with significant improvement in her daytime alertness. She is rested upon she has lost 10 pounds just over the last few months, and her Epworth score today is only 4. A download from her device is not available currently.   Sleep Questionnaire What time do you typically go to bed?( Between what hours) 10-11pm 10-11pm at 1551 on 09/22/13 by Nicanor AlconElise K Nagle, RN How long does it take you to fall asleep? 10-4515min 10-15min at 1551 on 09/22/13 by Nicanor AlconElise K Nagle, RN How many times during the night do you wake up? 2 2 at 1551 on 09/22/13 by Nicanor AlconElise K Nagle, RN What time do you get out of bed to start your day? [id number] at 1551 on 09/22/13 by Nicanor AlconElise K Nagle, RN Do you drive or operate heavy machinery in your occupation? No No at 1551 on 09/22/13 by Nicanor AlconElise K Nagle, RN How much has your weight changed (up or down) over the past two years? (In pounds) 10 lb (4.536 kg) 10 lb (4.536 kg) at 1551 on 09/22/13 by Nicanor AlconElise K Nagle, RN Have you ever had a sleep study before? Yes Yes at 1551 on 09/22/13 by Nicanor AlconElise K Nagle, RN If yes, [facility] at 1551 on 09/22/13 by  Nicanor AlconElise K Nagle, RN If yes, date of study? 08/03/2013 08/03/2013 at 1551 on 09/22/13 by Nicanor AlconElise K Nagle, RN Do you currently use CPAP? Yes Yes at 1551 on 09/22/13 by Nicanor AlconElise K Nagle, RN If so, what pressure? 6 6 at 1551 on 09/22/13 by Nicanor AlconElise K Nagle, RN Do you wear oxygen at any time? No No at 1551 on 09/22/13 by Nicanor AlconElise K Nagle, RN   Review of Systems  Constitutional: Negative for fever and unexpected weight change.  HENT: Negative for congestion, dental problem, ear pain, nosebleeds, postnasal drip, rhinorrhea, sinus pressure, sneezing, sore throat and trouble swallowing.   Eyes: Negative for redness and itching.  Respiratory: Negative for cough, chest tightness, shortness of breath and wheezing.   Cardiovascular: Negative for palpitations and leg swelling.  Gastrointestinal: Negative for nausea and vomiting.  Genitourinary: Negative for dysuria.  Musculoskeletal: Negative for joint swelling.  Skin: Negative for rash.  Neurological: Negative for headaches.  Hematological: Does not bruise/bleed easily.  Psychiatric/Behavioral: Negative for dysphoric mood. The patient is not nervous/anxious.        Objective:   Physical Exam Constitutional:  Overweight female, no acute distress  HENT:  Nares patent without discharge  Oropharynx without exudate, palate and uvula are mildly elongated.  Eyes:  Perrla, eomi, no  scleral icterus  Neck:  No JVD, no TMG  Cardiovascular:  Normal rate, regular rhythm, no rubs or gallops.  No murmurs        Intact distal pulses  Pulmonary :  Normal breath sounds, no stridor or respiratory distress   No rales, rhonchi, or wheezing  Abdominal:  Soft, nondistended, bowel sounds present.  No tenderness noted.   Musculoskeletal:  mild lower extremity edema noted.  Lymph Nodes:  No cervical lymphadenopathy noted  Skin:  No cyanosis noted  Neurologic:  Alert, appropriate, moves all 4 extremities without obvious deficit.         Assessment & Plan:

## 2013-09-22 NOTE — Assessment & Plan Note (Signed)
The patient has been diagnosed with moderate obstructive sleep apnea, and has been started on CPAP for treatment. She has done very well with the device, with no significant mask or pressure issues. Her device is set on 6 cm of water, however in my experience this is a fairly low pressure for her degree of sleep apnea. Her titration study represented one night out of many, and I will need to get a download off her machine to make sure that her sleep apnea is adequately controlled on this level of pressure. I've also encouraged her to work aggressively on weight loss, and to keep up with her mask changes and supplies.

## 2013-11-01 ENCOUNTER — Telehealth: Payer: Self-pay | Admitting: Family Medicine

## 2013-11-01 DIAGNOSIS — E079 Disorder of thyroid, unspecified: Secondary | ICD-10-CM

## 2013-11-01 MED ORDER — LEVOTHYROXINE SODIUM 150 MCG PO TABS: 150.0000 ug | ORAL_TABLET | Freq: Every day | ORAL | Status: DC

## 2013-11-01 NOTE — Telephone Encounter (Signed)
Refill-levothyroxine  [facility]

## 2013-12-24 ENCOUNTER — Encounter: Payer: 59 | Admitting: Family Medicine

## 2014-01-09 ENCOUNTER — Other Ambulatory Visit: Payer: Self-pay | Admitting: Family Medicine

## 2014-02-09 ENCOUNTER — Other Ambulatory Visit: Payer: Self-pay | Admitting: Family Medicine

## 2014-02-16 ENCOUNTER — Encounter: Payer: Self-pay | Admitting: Pulmonary Disease

## 2014-03-25 ENCOUNTER — Ambulatory Visit: Payer: 59 | Admitting: Pulmonary Disease

## 2014-04-04 ENCOUNTER — Other Ambulatory Visit: Payer: Self-pay | Admitting: Family Medicine

## 2014-04-08 ENCOUNTER — Encounter: Payer: Self-pay | Admitting: Pulmonary Disease

## 2014-04-08 ENCOUNTER — Ambulatory Visit (INDEPENDENT_AMBULATORY_CARE_PROVIDER_SITE_OTHER): Payer: 59 | Admitting: Pulmonary Disease

## 2014-04-08 VITALS — BP 124/68 | HR 82 | Temp 98.3°F | Ht 65.0 in | Wt 269.6 lb

## 2014-04-08 DIAGNOSIS — G4733 Obstructive sleep apnea (adult) (pediatric): Secondary | ICD-10-CM

## 2014-04-08 NOTE — Assessment & Plan Note (Signed)
The patient is doing very well from a sleep apnea standpoint, and she is satisfied with her sleep and daytime alertness. She is having a few breakthroughs on her current pressure setting, and we'll therefore increase the level to 8 cm. I have also encouraged her to work aggressively on weight loss.

## 2014-04-08 NOTE — Progress Notes (Signed)
   Subjective:    Patient ID: Tiffany Morris, female    DOB: 18-Dec-1965, 48 y.o.   MRN: [id number]  HPI The patient comes in today for follow-up of her obstructive sleep apnea. She is wearing C Pap compliantly by her download, and has fairly good control of her AHI. She does have some breakthrough on a few days, and I would like to increase her pressure slightly. She denies any mask fit issues, and there is very little leak on her downloaded data. She feels that she is sleeping well with the device, and is satisfied with her daytime alertness.   Review of Systems  Constitutional: Negative for fever and unexpected weight change.  HENT: Negative for congestion, dental problem, ear pain, nosebleeds, postnasal drip, rhinorrhea, sinus pressure, sneezing, sore throat and trouble swallowing.   Eyes: Negative for redness and itching.  Respiratory: Negative for cough, chest tightness, shortness of breath and wheezing.   Cardiovascular: Negative for palpitations and leg swelling.  Gastrointestinal: Negative for nausea and vomiting.  Genitourinary: Negative for dysuria.  Musculoskeletal: Negative for joint swelling.  Skin: Negative for rash.  Neurological: Negative for headaches.  Hematological: Does not bruise/bleed easily.  Psychiatric/Behavioral: Negative for dysphoric mood. The patient is not nervous/anxious.        Objective:   Physical Exam Overweight female in no acute distress Nose without purulence or discharge noted Neck without lymphadenopathy or thyromegaly No skin breakdown or pressure necrosis from the C Pap mask Lower extremities without edema, no cyanosis Alert and oriented, moves all 4 extremities.       Assessment & Plan:

## 2014-04-08 NOTE — Patient Instructions (Signed)
Will have your cpap pressure increased to 8cm via the modem. Keep up with mask changes and supplies, and work on weight loss. followup with me again in one year.

## 2014-04-14 ENCOUNTER — Other Ambulatory Visit: Payer: Self-pay | Admitting: Family Medicine

## 2014-06-02 ENCOUNTER — Ambulatory Visit (INDEPENDENT_AMBULATORY_CARE_PROVIDER_SITE_OTHER): Payer: 59 | Admitting: *Deleted

## 2014-06-02 ENCOUNTER — Encounter: Payer: Self-pay | Admitting: Family Medicine

## 2014-06-02 ENCOUNTER — Ambulatory Visit (INDEPENDENT_AMBULATORY_CARE_PROVIDER_SITE_OTHER): Payer: 59 | Admitting: Family Medicine

## 2014-06-02 VITALS — BP 126/87 | HR 100 | Temp 98.4°F | Ht 65.75 in | Wt 269.0 lb

## 2014-06-02 DIAGNOSIS — G4733 Obstructive sleep apnea (adult) (pediatric): Secondary | ICD-10-CM

## 2014-06-02 DIAGNOSIS — R739 Hyperglycemia, unspecified: Secondary | ICD-10-CM

## 2014-06-02 DIAGNOSIS — Z0000 Encounter for general adult medical examination without abnormal findings: Secondary | ICD-10-CM

## 2014-06-02 DIAGNOSIS — E669 Obesity, unspecified: Secondary | ICD-10-CM

## 2014-06-02 DIAGNOSIS — E785 Hyperlipidemia, unspecified: Secondary | ICD-10-CM

## 2014-06-02 DIAGNOSIS — Z23 Encounter for immunization: Secondary | ICD-10-CM

## 2014-06-02 MED ORDER — LEVOTHYROXINE SODIUM 150 MCG PO TABS: ORAL_TABLET | ORAL | Status: DC

## 2014-06-02 MED ORDER — CYANOCOBALAMIN 1000 MCG/ML IJ SOLN: 1000.0000 ug | Freq: Once | INTRAMUSCULAR | Status: DC

## 2014-06-02 NOTE — Progress Notes (Signed)
Pre visit review using our clinic review tool, if applicable. No additional management support is needed unless otherwise documented below in the visit note. 

## 2014-06-02 NOTE — Patient Instructions (Signed)
Preventive Care for Adults A healthy lifestyle and preventive care can promote health and wellness. Preventive health guidelines for women include the following key practices.  A routine yearly physical is a good way to check with your health care provider about your health and preventive screening. It is a chance to share any concerns and updates on your health and to receive a thorough exam.  Visit your dentist for a routine exam and preventive care every 6 months. Brush your teeth twice a day and floss once a day. Good oral hygiene prevents tooth decay and gum disease.  The frequency of eye exams is based on your age, health, family medical history, use of contact lenses, and other factors. Follow your health care provider's recommendations for frequency of eye exams.  Eat a healthy diet. Foods like vegetables, fruits, whole grains, low-fat dairy products, and lean protein foods contain the nutrients you need without too many calories. Decrease your intake of foods high in solid fats, added sugars, and salt. Eat the right amount of calories for you.Get information about a proper diet from your health care provider, if necessary.  Regular physical exercise is one of the most important things you can do for your health. Most adults should get at least 150 minutes of moderate-intensity exercise (any activity that increases your heart rate and causes you to sweat) each week. In addition, most adults need muscle-strengthening exercises on 2 or more days a week.  Maintain a healthy weight. The body mass index (BMI) is a screening tool to identify possible weight problems. It provides an estimate of body fat based on height and weight. Your health care provider can find your BMI and can help you achieve or maintain a healthy weight.For adults 20 years and older:  A BMI below 18.5 is considered underweight.  A BMI of 18.5 to 24.9 is normal.  A BMI of 25 to 29.9 is considered overweight.  A BMI of  30 and above is considered obese.  Maintain normal blood lipids and cholesterol levels by exercising and minimizing your intake of saturated fat. Eat a balanced diet with plenty of fruit and vegetables. Blood tests for lipids and cholesterol should begin at age 76 and be repeated every 5 years. If your lipid or cholesterol levels are high, you are over 50, or you are at high risk for heart disease, you may need your cholesterol levels checked more frequently.Ongoing high lipid and cholesterol levels should be treated with medicines if diet and exercise are not working.  If you smoke, find out from your health care provider how to quit. If you do not use tobacco, do not start.  Lung cancer screening is recommended for adults aged 22-80 years who are at high risk for developing lung cancer because of a history of smoking. A yearly low-dose CT scan of the lungs is recommended for people who have at least a 30-pack-year history of smoking and are a current smoker or have quit within the past 15 years. A pack year of smoking is smoking an average of 1 pack of cigarettes a day for 1 year (for example: 1 pack a day for 30 years or 2 packs a day for 15 years). Yearly screening should continue until the smoker has stopped smoking for at least 15 years. Yearly screening should be stopped for people who develop a health problem that would prevent them from having lung cancer treatment.  If you are pregnant, do not drink alcohol. If you are breastfeeding,  be very cautious about drinking alcohol. If you are not pregnant and choose to drink alcohol, do not have more than 1 drink per day. One drink is considered to be 12 ounces (355 mL) of beer, 5 ounces (148 mL) of wine, or 1.5 ounces (44 mL) of liquor.  Avoid use of street drugs. Do not share needles with anyone. Ask for help if you need support or instructions about stopping the use of drugs.  High blood pressure causes heart disease and increases the risk of  stroke. Your blood pressure should be checked at least every 1 to 2 years. Ongoing high blood pressure should be treated with medicines if weight loss and exercise do not work.  If you are 3-86 years old, ask your health care provider if you should take aspirin to prevent strokes.  Diabetes screening involves taking a blood sample to check your fasting blood sugar level. This should be done once every 3 years, after age 67, if you are within normal weight and without risk factors for diabetes. Testing should be considered at a younger age or be carried out more frequently if you are overweight and have at least 1 risk factor for diabetes.  Breast cancer screening is essential preventive care for women. You should practice "breast self-awareness." This means understanding the normal appearance and feel of your breasts and may include breast self-examination. Any changes detected, no matter how small, should be reported to a health care provider. Women in their 8s and 30s should have a clinical breast exam (CBE) by a health care provider as part of a regular health exam every 1 to 3 years. After age 70, women should have a CBE every year. Starting at age 25, women should consider having a mammogram (breast X-ray test) every year. Women who have a family history of breast cancer should talk to their health care provider about genetic screening. Women at a high risk of breast cancer should talk to their health care providers about having an MRI and a mammogram every year.  Breast cancer gene (BRCA)-related cancer risk assessment is recommended for women who have family members with BRCA-related cancers. BRCA-related cancers include breast, ovarian, tubal, and peritoneal cancers. Having family members with these cancers may be associated with an increased risk for harmful changes (mutations) in the breast cancer genes BRCA1 and BRCA2. Results of the assessment will determine the need for genetic counseling and  BRCA1 and BRCA2 testing.  Routine pelvic exams to screen for cancer are no longer recommended for nonpregnant women who are considered low risk for cancer of the pelvic organs (ovaries, uterus, and vagina) and who do not have symptoms. Ask your health care provider if a screening pelvic exam is right for you.  If you have had past treatment for cervical cancer or a condition that could lead to cancer, you need Pap tests and screening for cancer for at least 20 years after your treatment. If Pap tests have been discontinued, your risk factors (such as having a new sexual partner) need to be reassessed to determine if screening should be resumed. Some women have medical problems that increase the chance of getting cervical cancer. In these cases, your health care provider may recommend more frequent screening and Pap tests.  The HPV test is an additional test that may be used for cervical cancer screening. The HPV test looks for the virus that can cause the cell changes on the cervix. The cells collected during the Pap test can be  tested for HPV. The HPV test could be used to screen women aged 30 years and older, and should be used in women of any age who have unclear Pap test results. After the age of 30, women should have HPV testing at the same frequency as a Pap test.  Colorectal cancer can be detected and often prevented. Most routine colorectal cancer screening begins at the age of 50 years and continues through age 75 years. However, your health care provider may recommend screening at an earlier age if you have risk factors for colon cancer. On a yearly basis, your health care provider may provide home test kits to check for hidden blood in the stool. Use of a small camera at the end of a tube, to directly examine the colon (sigmoidoscopy or colonoscopy), can detect the earliest forms of colorectal cancer. Talk to your health care provider about this at age 50, when routine screening begins. Direct  exam of the colon should be repeated every 5-10 years through age 75 years, unless early forms of pre-cancerous polyps or small growths are found.  People who are at an increased risk for hepatitis B should be screened for this virus. You are considered at high risk for hepatitis B if:  You were born in a country where hepatitis B occurs often. Talk with your health care provider about which countries are considered high risk.  Your parents were born in a high-risk country and you have not received a shot to protect against hepatitis B (hepatitis B vaccine).  You have HIV or AIDS.  You use needles to inject street drugs.  You live with, or have sex with, someone who has hepatitis B.  You get hemodialysis treatment.  You take certain medicines for conditions like cancer, organ transplantation, and autoimmune conditions.  Hepatitis C blood testing is recommended for all people born from 1945 through 1965 and any individual with known risks for hepatitis C.  Practice safe sex. Use condoms and avoid high-risk sexual practices to reduce the spread of sexually transmitted infections (STIs). STIs include gonorrhea, chlamydia, syphilis, trichomonas, herpes, HPV, and human immunodeficiency virus (HIV). Herpes, HIV, and HPV are viral illnesses that have no cure. They can result in disability, cancer, and death.  You should be screened for sexually transmitted illnesses (STIs) including gonorrhea and chlamydia if:  You are sexually active and are younger than 24 years.  You are older than 24 years and your health care provider tells you that you are at risk for this type of infection.  Your sexual activity has changed since you were last screened and you are at an increased risk for chlamydia or gonorrhea. Ask your health care provider if you are at risk.  If you are at risk of being infected with HIV, it is recommended that you take a prescription medicine daily to prevent HIV infection. This is  called preexposure prophylaxis (PrEP). You are considered at risk if:  You are a heterosexual woman, are sexually active, and are at increased risk for HIV infection.  You take drugs by injection.  You are sexually active with a partner who has HIV.  Talk with your health care provider about whether you are at high risk of being infected with HIV. If you choose to begin PrEP, you should first be tested for HIV. You should then be tested every 3 months for as long as you are taking PrEP.  Osteoporosis is a disease in which the bones lose minerals and strength   with aging. This can result in serious bone fractures or breaks. The risk of osteoporosis can be identified using a bone density scan. Women ages 65 years and over and women at risk for fractures or osteoporosis should discuss screening with their health care providers. Ask your health care provider whether you should take a calcium supplement or vitamin D to reduce the rate of osteoporosis.  Menopause can be associated with physical symptoms and risks. Hormone replacement therapy is available to decrease symptoms and risks. You should talk to your health care provider about whether hormone replacement therapy is right for you.  Use sunscreen. Apply sunscreen liberally and repeatedly throughout the day. You should seek shade when your shadow is shorter than you. Protect yourself by wearing long sleeves, pants, a wide-brimmed hat, and sunglasses year round, whenever you are outdoors.  Once a month, do a whole body skin exam, using a mirror to look at the skin on your back. Tell your health care provider of new moles, moles that have irregular borders, moles that are larger than a pencil eraser, or moles that have changed in shape or color.  Stay current with required vaccines (immunizations).  Influenza vaccine. All adults should be immunized every year.  Tetanus, diphtheria, and acellular pertussis (Td, Tdap) vaccine. Pregnant women should  receive 1 dose of Tdap vaccine during each pregnancy. The dose should be obtained regardless of the length of time since the last dose. Immunization is preferred during the 27th-36th week of gestation. An adult who has not previously received Tdap or who does not know her vaccine status should receive 1 dose of Tdap. This initial dose should be followed by tetanus and diphtheria toxoids (Td) booster doses every 10 years. Adults with an unknown or incomplete history of completing a 3-dose immunization series with Td-containing vaccines should begin or complete a primary immunization series including a Tdap dose. Adults should receive a Td booster every 10 years.  Varicella vaccine. An adult without evidence of immunity to varicella should receive 2 doses or a second dose if she has previously received 1 dose. Pregnant females who do not have evidence of immunity should receive the first dose after pregnancy. This first dose should be obtained before leaving the health care facility. The second dose should be obtained 4-8 weeks after the first dose.  Human papillomavirus (HPV) vaccine. Females aged 13-26 years who have not received the vaccine previously should obtain the 3-dose series. The vaccine is not recommended for use in pregnant females. However, pregnancy testing is not needed before receiving a dose. If a female is found to be pregnant after receiving a dose, no treatment is needed. In that case, the remaining doses should be delayed until after the pregnancy. Immunization is recommended for any person with an immunocompromised condition through the age of 26 years if she did not get any or all doses earlier. During the 3-dose series, the second dose should be obtained 4-8 weeks after the first dose. The third dose should be obtained 24 weeks after the first dose and 16 weeks after the second dose.  Zoster vaccine. One dose is recommended for adults aged 60 years or older unless certain conditions are  present.  Measles, mumps, and rubella (MMR) vaccine. Adults born before 1957 generally are considered immune to measles and mumps. Adults born in 1957 or later should have 1 or more doses of MMR vaccine unless there is a contraindication to the vaccine or there is laboratory evidence of immunity to   each of the three diseases. A routine second dose of MMR vaccine should be obtained at least 28 days after the first dose for students attending postsecondary schools, health care workers, or international travelers. People who received inactivated measles vaccine or an unknown type of measles vaccine during 1963-1967 should receive 2 doses of MMR vaccine. People who received inactivated mumps vaccine or an unknown type of mumps vaccine before 1979 and are at high risk for mumps infection should consider immunization with 2 doses of MMR vaccine. For females of childbearing age, rubella immunity should be determined. If there is no evidence of immunity, females who are not pregnant should be vaccinated. If there is no evidence of immunity, females who are pregnant should delay immunization until after pregnancy. Unvaccinated health care workers born before 1957 who lack laboratory evidence of measles, mumps, or rubella immunity or laboratory confirmation of disease should consider measles and mumps immunization with 2 doses of MMR vaccine or rubella immunization with 1 dose of MMR vaccine.  Pneumococcal 13-valent conjugate (PCV13) vaccine. When indicated, a person who is uncertain of her immunization history and has no record of immunization should receive the PCV13 vaccine. An adult aged 19 years or older who has certain medical conditions and has not been previously immunized should receive 1 dose of PCV13 vaccine. This PCV13 should be followed with a dose of pneumococcal polysaccharide (PPSV23) vaccine. The PPSV23 vaccine dose should be obtained at least 8 weeks after the dose of PCV13 vaccine. An adult aged 19  years or older who has certain medical conditions and previously received 1 or more doses of PPSV23 vaccine should receive 1 dose of PCV13. The PCV13 vaccine dose should be obtained 1 or more years after the last PPSV23 vaccine dose.  Pneumococcal polysaccharide (PPSV23) vaccine. When PCV13 is also indicated, PCV13 should be obtained first. All adults aged 65 years and older should be immunized. An adult younger than age 65 years who has certain medical conditions should be immunized. Any person who resides in a nursing home or long-term care facility should be immunized. An adult smoker should be immunized. People with an immunocompromised condition and certain other conditions should receive both PCV13 and PPSV23 vaccines. People with human immunodeficiency virus (HIV) infection should be immunized as soon as possible after diagnosis. Immunization during chemotherapy or radiation therapy should be avoided. Routine use of PPSV23 vaccine is not recommended for American Indians, [place] Natives, or people younger than 65 years unless there are medical conditions that require PPSV23 vaccine. When indicated, people who have unknown immunization and have no record of immunization should receive PPSV23 vaccine. One-time revaccination 5 years after the first dose of PPSV23 is recommended for people aged 19-64 years who have chronic kidney failure, nephrotic syndrome, asplenia, or immunocompromised conditions. People who received 1-2 doses of PPSV23 before age 65 years should receive another dose of PPSV23 vaccine at age 65 years or later if at least 5 years have passed since the previous dose. Doses of PPSV23 are not needed for people immunized with PPSV23 at or after age 65 years.  Meningococcal vaccine. Adults with asplenia or persistent complement component deficiencies should receive 2 doses of quadrivalent meningococcal conjugate (MenACWY-D) vaccine. The doses should be obtained at least 2 months apart.  Microbiologists working with certain meningococcal bacteria, military recruits, people at risk during an outbreak, and people who travel to or live in countries with a high rate of meningitis should be immunized. A first-year college student up through age   21 years who is living in a residence hall should receive a dose if she did not receive a dose on or after her 16th birthday. Adults who have certain high-risk conditions should receive one or more doses of vaccine.  Hepatitis A vaccine. Adults who wish to be protected from this disease, have certain high-risk conditions, work with hepatitis A-infected animals, work in hepatitis A research labs, or travel to or work in countries with a high rate of hepatitis A should be immunized. Adults who were previously unvaccinated and who anticipate close contact with an international adoptee during the first 60 days after arrival in the [place] from a country with a high rate of hepatitis A should be immunized.  Hepatitis B vaccine. Adults who wish to be protected from this disease, have certain high-risk conditions, may be exposed to blood or other infectious body fluids, are household contacts or sex partners of hepatitis B positive people, are clients or workers in certain care facilities, or travel to or work in countries with a high rate of hepatitis B should be immunized.  Haemophilus influenzae type b (Hib) vaccine. A previously unvaccinated person with asplenia or sickle cell disease or having a scheduled splenectomy should receive 1 dose of Hib vaccine. Regardless of previous immunization, a recipient of a hematopoietic stem cell transplant should receive a 3-dose series 6-12 months after her successful transplant. Hib vaccine is not recommended for adults with HIV infection. Preventive Services / Frequency Ages 64 to 68 years  Blood pressure check.** / Every 1 to 2 years.  Lipid and cholesterol check.** / Every 5 years beginning at age  22.  Clinical breast exam.** / Every 3 years for women in their 88s and 53s.  BRCA-related cancer risk assessment.** / For women who have family members with a BRCA-related cancer (breast, ovarian, tubal, or peritoneal cancers).  Pap test.** / Every 2 years from ages 90 through 51. Every 3 years starting at age 21 through age 56 or 3 with a history of 3 consecutive normal Pap tests.  HPV screening.** / Every 3 years from ages 24 through ages 1 to 46 with a history of 3 consecutive normal Pap tests.  Hepatitis C blood test.** / For any individual with known risks for hepatitis C.  Skin self-exam. / Monthly.  Influenza vaccine. / Every year.  Tetanus, diphtheria, and acellular pertussis (Tdap, Td) vaccine.** / Consult your health care provider. Pregnant women should receive 1 dose of Tdap vaccine during each pregnancy. 1 dose of Td every 10 years.  Varicella vaccine.** / Consult your health care provider. Pregnant females who do not have evidence of immunity should receive the first dose after pregnancy.  HPV vaccine. / 3 doses over 6 months, if 72 and younger. The vaccine is not recommended for use in pregnant females. However, pregnancy testing is not needed before receiving a dose.  Measles, mumps, rubella (MMR) vaccine.** / You need at least 1 dose of MMR if you were born in 1957 or later. You may also need a 2nd dose. For females of childbearing age, rubella immunity should be determined. If there is no evidence of immunity, females who are not pregnant should be vaccinated. If there is no evidence of immunity, females who are pregnant should delay immunization until after pregnancy.  Pneumococcal 13-valent conjugate (PCV13) vaccine.** / Consult your health care provider.  Pneumococcal polysaccharide (PPSV23) vaccine.** / 1 to 2 doses if you smoke cigarettes or if you have certain conditions.  Meningococcal vaccine.** /  1 dose if you are age 19 to 21 years and a first-year college  student living in a residence hall, or have one of several medical conditions, you need to get vaccinated against meningococcal disease. You may also need additional booster doses.  Hepatitis A vaccine.** / Consult your health care provider.  Hepatitis B vaccine.** / Consult your health care provider.  Haemophilus influenzae type b (Hib) vaccine.** / Consult your health care provider. Ages 40 to 64 years  Blood pressure check.** / Every 1 to 2 years.  Lipid and cholesterol check.** / Every 5 years beginning at age 20 years.  Lung cancer screening. / Every year if you are aged 55-80 years and have a 30-pack-year history of smoking and currently smoke or have quit within the past 15 years. Yearly screening is stopped once you have quit smoking for at least 15 years or develop a health problem that would prevent you from having lung cancer treatment.  Clinical breast exam.** / Every year after age 40 years.  BRCA-related cancer risk assessment.** / For women who have family members with a BRCA-related cancer (breast, ovarian, tubal, or peritoneal cancers).  Mammogram.** / Every year beginning at age 40 years and continuing for as long as you are in good health. Consult with your health care provider.  Pap test.** / Every 3 years starting at age 30 years through age 65 or 70 years with a history of 3 consecutive normal Pap tests.  HPV screening.** / Every 3 years from ages 30 years through ages 65 to 70 years with a history of 3 consecutive normal Pap tests.  Fecal occult blood test (FOBT) of stool. / Every year beginning at age 50 years and continuing until age 75 years. You may not need to do this test if you get a colonoscopy every 10 years.  Flexible sigmoidoscopy or colonoscopy.** / Every 5 years for a flexible sigmoidoscopy or every 10 years for a colonoscopy beginning at age 50 years and continuing until age 75 years.  Hepatitis C blood test.** / For all people born from 1945 through  1965 and any individual with known risks for hepatitis C.  Skin self-exam. / Monthly.  Influenza vaccine. / Every year.  Tetanus, diphtheria, and acellular pertussis (Tdap/Td) vaccine.** / Consult your health care provider. Pregnant women should receive 1 dose of Tdap vaccine during each pregnancy. 1 dose of Td every 10 years.  Varicella vaccine.** / Consult your health care provider. Pregnant females who do not have evidence of immunity should receive the first dose after pregnancy.  Zoster vaccine.** / 1 dose for adults aged 60 years or older.  Measles, mumps, rubella (MMR) vaccine.** / You need at least 1 dose of MMR if you were born in 1957 or later. You may also need a 2nd dose. For females of childbearing age, rubella immunity should be determined. If there is no evidence of immunity, females who are not pregnant should be vaccinated. If there is no evidence of immunity, females who are pregnant should delay immunization until after pregnancy.  Pneumococcal 13-valent conjugate (PCV13) vaccine.** / Consult your health care provider.  Pneumococcal polysaccharide (PPSV23) vaccine.** / 1 to 2 doses if you smoke cigarettes or if you have certain conditions.  Meningococcal vaccine.** / Consult your health care provider.  Hepatitis A vaccine.** / Consult your health care provider.  Hepatitis B vaccine.** / Consult your health care provider.  Haemophilus influenzae type b (Hib) vaccine.** / Consult your health care provider. Ages 65   years and over  Blood pressure check.** / Every 1 to 2 years.  Lipid and cholesterol check.** / Every 5 years beginning at age 22 years.  Lung cancer screening. / Every year if you are aged 73-80 years and have a 30-pack-year history of smoking and currently smoke or have quit within the past 15 years. Yearly screening is stopped once you have quit smoking for at least 15 years or develop a health problem that would prevent you from having lung cancer  treatment.  Clinical breast exam.** / Every year after age 4 years.  BRCA-related cancer risk assessment.** / For women who have family members with a BRCA-related cancer (breast, ovarian, tubal, or peritoneal cancers).  Mammogram.** / Every year beginning at age 40 years and continuing for as long as you are in good health. Consult with your health care provider.  Pap test.** / Every 3 years starting at age 9 years through age 34 or 91 years with 3 consecutive normal Pap tests. Testing can be stopped between 65 and 70 years with 3 consecutive normal Pap tests and no abnormal Pap or HPV tests in the past 10 years.  HPV screening.** / Every 3 years from ages 57 years through ages 64 or 45 years with a history of 3 consecutive normal Pap tests. Testing can be stopped between 65 and 70 years with 3 consecutive normal Pap tests and no abnormal Pap or HPV tests in the past 10 years.  Fecal occult blood test (FOBT) of stool. / Every year beginning at age 15 years and continuing until age 17 years. You may not need to do this test if you get a colonoscopy every 10 years.  Flexible sigmoidoscopy or colonoscopy.** / Every 5 years for a flexible sigmoidoscopy or every 10 years for a colonoscopy beginning at age 86 years and continuing until age 71 years.  Hepatitis C blood test.** / For all people born from [birth year] through 1963 and any individual with known risks for hepatitis C.  Osteoporosis screening.** / A one-time screening for women ages 83 years and over and women at risk for fractures or osteoporosis.  Skin self-exam. / Monthly.  Influenza vaccine. / Every year.  Tetanus, diphtheria, and acellular pertussis (Tdap/Td) vaccine.** / 1 dose of Td every 10 years.  Varicella vaccine.** / Consult your health care provider.  Zoster vaccine.** / 1 dose for adults aged 61 years or older.  Pneumococcal 13-valent conjugate (PCV13) vaccine.** / Consult your health care provider.  Pneumococcal  polysaccharide (PPSV23) vaccine.** / 1 dose for all adults aged 28 years and older.  Meningococcal vaccine.** / Consult your health care provider.  Hepatitis A vaccine.** / Consult your health care provider.  Hepatitis B vaccine.** / Consult your health care provider.  Haemophilus influenzae type b (Hib) vaccine.** / Consult your health care provider. ** Family history and personal history of risk and conditions may change your health care provider's recommendations. Document Released: 07/09/2001 Document Revised: 09/27/2013 Document Reviewed: 10/08/2010 [facility] Patient Information 2015 [place], [place]. This information is not intended to replace advice given to you by your health care provider. Make sure you discuss any questions you have with your health care provider.

## 2014-06-03 LAB — COMPREHENSIVE METABOLIC PANEL
ALT: 29 U/L (ref 0–35)
AST: 28 U/L (ref 0–37)
Albumin: 3.9 g/dL (ref 3.5–5.2)
Alkaline Phosphatase: 61 U/L (ref 39–117)
BILIRUBIN TOTAL: 0.5 mg/dL (ref 0.2–1.2)
BUN: 13 mg/dL (ref 6–23)
CO2: 24 mEq/L (ref 19–32)
CREATININE: 0.9 mg/dL (ref 0.4–1.2)
Calcium: 9.3 mg/dL (ref 8.4–10.5)
Chloride: 107 mEq/L (ref 96–112)
GFR: 71.53 mL/min (ref >60.00)
Glucose, Bld: 120 mg/dL — ABNORMAL HIGH (ref 70–99)
Potassium: 4.1 mEq/L (ref 3.5–5.1)
SODIUM: 139 meq/L (ref 135–145)
Total Protein: 7.6 g/dL (ref 6.0–8.3)

## 2014-06-03 LAB — CBC
HCT: 42.2 % (ref 36.0–46.0)
Hemoglobin: 14 g/dL (ref 12.0–15.0)
MCHC: 33.1 g/dL (ref 30.0–36.0)
MCV: 86.3 fl (ref 78.0–100.0)
Platelets: 280 10*3/uL (ref 150.0–400.0)
RBC: 4.89 Mil/uL (ref 3.87–5.11)
RDW: 13 % (ref 11.5–15.5)
WBC: 9.5 10*3/uL (ref 4.0–10.5)

## 2014-06-03 LAB — LIPID PANEL
CHOL/HDL RATIO: 6
CHOLESTEROL: 242 mg/dL — AB (ref 0–200)
HDL: 39.7 mg/dL (ref >39.00)
NonHDL: 202.3
Triglycerides: 231 mg/dL — ABNORMAL HIGH (ref 0.0–149.0)
VLDL: 46.2 mg/dL — ABNORMAL HIGH (ref 0.0–40.0)

## 2014-06-03 LAB — TSH: TSH: 1.46 u[IU]/mL (ref 0.35–4.50)

## 2014-06-03 LAB — HEMOGLOBIN A1C: Hgb A1c MFr Bld: 5.8 % (ref 4.6–6.5)

## 2014-06-03 LAB — LDL CHOLESTEROL, DIRECT: Direct LDL: 168.3 mg/dL

## 2014-06-05 ENCOUNTER — Encounter: Payer: Self-pay | Admitting: Family Medicine

## 2014-06-05 DIAGNOSIS — R739 Hyperglycemia, unspecified: Secondary | ICD-10-CM

## 2014-06-05 HISTORY — DX: Hyperglycemia, unspecified: R73.9

## 2014-06-05 NOTE — Assessment & Plan Note (Signed)
Using CPAP nightly. Is serviced by [facility]

## 2014-06-05 NOTE — Progress Notes (Signed)
Tiffany Morris  [id number] 01-Nov-1965 06/05/2014      Progress Note-Follow Up  Subjective  Chief Complaint  Chief Complaint  Patient presents with  . Annual Exam    no pap smear-non-fasting    HPI  Patient is a 49 y.o. female in today for routine medical care. She is in today for annual exam. Feeling well. Has started over-the-counter Thrive products for weight loss. She does feel this is helping her appetite somewhat. No recent illness. Denies CP/palp/SOB/HA/congestion/fevers/GI or GU c/o. Taking meds as prescribed  Past Medical History  Diagnosis Date  . Chicken pox as a child  . Thyroid disease     hyper  . Vitamin B 12 deficiency   . Obesity   . Depression with anxiety 08/05/2011  . hypothyroidism 06/02/2010  . Pain of left breast 08/05/2011  . Pedal edema 08/05/2011  . Hyperlipidemia 09/05/2011  . Unspecified sleep apnea 08/20/2013    Past Surgical History  Procedure Laterality Date  . Cesarean section  2004  . Retinal detachment repair w/ scleral buckle le  01-25-10  . Cholecystectomy      laparoscopic  . Colonoscopy  2014    Family History  Problem Relation Age of Onset  . Hyperlipidemia Mother   . Diabetes Mother     borderline  . Hyperlipidemia Father   . Hypertension Father   . Diabetes Father     type 2  . Hyperthyroidism Father   . Obesity Father   . Cancer Maternal Grandmother     lung- smoker  . COPD Maternal Grandfather     smoked  . Cancer Maternal Grandfather     bladder  . Heart disease Paternal Grandmother     CHF, MI  . Hypothyroidism Paternal Grandmother   . Hypertension Paternal Grandmother   . Hypertension Paternal Grandfather   . Heart disease Paternal Grandfather     open heart surgery  . Hyperlipidemia Paternal Grandfather   . Macular degeneration Paternal Grandfather     History   Social History  . Marital Status: Married    Spouse Name: N/A    Number of Children: N/A  . Years of Education: N/A   Occupational  History  . NP [employer]   Social History Main Topics  . Smoking status: Never Smoker   . Smokeless tobacco: Never Used  . Alcohol Use: No  . Drug Use: No  . Sexual Activity: Not Currently     Comment: lives with husband, son age 47, no dietary restrictions.. Nurse Practiitoner with [employer]   Other Topics Concern  . Not on file   Social History Narrative    Current Outpatient Prescriptions on File Prior to Visit  Medication Sig Dispense Refill  . levonorgestrel (MIRENA) 20 MCG/24HR IUD 1 each by Intrauterine route once.    . Omega-3 Fatty Acids (OMEGA 3 PO) Take by mouth daily.    . sertraline (ZOLOFT) 100 MG tablet Take 1 tablet by mouth  daily 90 tablet 0   No current facility-administered medications on file prior to visit.    No Known Allergies  Review of Systems  Review of Systems  Constitutional: Positive for malaise/fatigue. Negative for fever and chills.  HENT: Negative for congestion, hearing loss and nosebleeds.   Eyes: Negative for discharge.  Respiratory: Negative for cough, sputum production, shortness of breath and wheezing.   Cardiovascular: Negative for chest pain, palpitations and leg swelling.  Gastrointestinal: Negative for heartburn, nausea, vomiting, abdominal pain, diarrhea, constipation and blood  in stool.  Genitourinary: Negative for dysuria, urgency, frequency and hematuria.  Musculoskeletal: Negative for myalgias, back pain and falls.  Skin: Negative for rash.  Neurological: Negative for dizziness, tremors, sensory change, focal weakness, loss of consciousness, weakness and headaches.  Endo/Heme/Allergies: Negative for polydipsia. Does not bruise/bleed easily.  Psychiatric/Behavioral: Negative for depression and suicidal ideas. The patient is not nervous/anxious and does not have insomnia.     Objective  BP 126/87 mmHg  Pulse 100  Temp(Src) 98.4 F (36.9 C) (Oral)  Ht 5' 5.75" (1.67 m)  Wt 269 lb (122.018 kg)  BMI 43.75 kg/m2  SpO2  96%  Physical Exam  Physical Exam  Constitutional: She is oriented to person, place, and time and well-developed, well-nourished, and in no distress. No distress.  HENT:  Head: Normocephalic and atraumatic.  Right Ear: External ear normal.  Left Ear: External ear normal.  Nose: Nose normal.  Mouth/Throat: Oropharynx is clear and moist. No oropharyngeal exudate.  Eyes: Conjunctivae are normal. Pupils are equal, round, and reactive to light. Right eye exhibits no discharge. Left eye exhibits no discharge. No scleral icterus.  Neck: Normal range of motion. Neck supple. No thyromegaly present.  Cardiovascular: Normal rate, regular rhythm, normal heart sounds and intact distal pulses.   No murmur heard. Pulmonary/Chest: Effort normal and breath sounds normal. No respiratory distress. She has no wheezes. She has no rales.  Abdominal: Soft. Bowel sounds are normal. She exhibits no distension and no mass. There is no tenderness.  Musculoskeletal: Normal range of motion. She exhibits no edema or tenderness.  Lymphadenopathy:    She has no cervical adenopathy.  Neurological: She is alert and oriented to person, place, and time. She has normal reflexes. No cranial nerve deficit. Coordination normal.  Skin: Skin is warm and dry. No rash noted. She is not diaphoretic.  Psychiatric: Mood, memory and affect normal.    Lab Results  Component Value Date   TSH 1.46 06/02/2014   Lab Results  Component Value Date   WBC 9.5 06/02/2014   HGB 14.0 06/02/2014   HCT 42.2 06/02/2014   MCV 86.3 06/02/2014   PLT 280.0 06/02/2014   Lab Results  Component Value Date   CREATININE 0.9 06/02/2014   BUN 13 06/02/2014   NA 139 06/02/2014   K 4.1 06/02/2014   CL 107 06/02/2014   CO2 24 06/02/2014   Lab Results  Component Value Date   ALT 29 06/02/2014   AST 28 06/02/2014   ALKPHOS 61 06/02/2014   BILITOT 0.5 06/02/2014   Lab Results  Component Value Date   CHOL 242* 06/02/2014   Lab Results    Component Value Date   HDL 39.70 06/02/2014   Lab Results  Component Value Date   LDLCALC 131* 08/20/2013   Lab Results  Component Value Date   TRIG 231.0* 06/02/2014   Lab Results  Component Value Date   CHOLHDL 6 06/02/2014     Assessment & Plan  OSA (obstructive sleep apnea) Using CPAP nightly. [facility]iced by [facility]  Obesity Has just started Thrive products over the counter and she believes that is helping. Encouraged DASH diet, decrease po intake and increase exercise as tolerated. Needs 7-8 hours of sleep nightly. Avoid trans fats, eat small, frequent meals every 4-5 hours with lean proteins, complex carbs and healthy fats. Minimize simple carbs, GMO foods.  Hyperlipidemia Encouraged heart healthy diet, increase exercise, avoid trans fats, consider a krill oil cap daily  Preventative health care Patient encouraged to  maintain heart healthy diet, regular exercise, adequate sleep. Consider daily probiotics. Take medications as prescribed. Reviewed labs, colonoscopy with Dr Evelena Peat on 03/26/12, to be repeated 10 years after that. Follows with Dr Seymour Bars of GYN  Hyperglycemia hgba1c acceptable, minimize simple carbs. Increase exercise as tolerated. Continue current meds

## 2014-06-05 NOTE — Assessment & Plan Note (Signed)
hgba1c acceptable, minimize simple carbs. Increase exercise as tolerated. Continue current meds 

## 2014-06-05 NOTE — Assessment & Plan Note (Signed)
Encouraged heart healthy diet, increase exercise, avoid trans fats, consider a krill oil cap daily 

## 2014-06-05 NOTE — Assessment & Plan Note (Signed)
Patient encouraged to maintain heart healthy diet, regular exercise, adequate sleep. Consider daily probiotics. Take medications as prescribed. Reviewed labs, colonoscopy with Dr Evelena PeatHueng on 03/26/12, to be repeated 10 years after that. Follows with Dr Seymour BarsLavoie of GYN

## 2014-06-05 NOTE — Assessment & Plan Note (Signed)
Has just started Thrive products over the counter and she believes that is helping. Encouraged DASH diet, decrease po intake and increase exercise as tolerated. Needs 7-8 hours of sleep nightly. Avoid trans fats, eat small, frequent meals every 4-5 hours with lean proteins, complex carbs and healthy fats. Minimize simple carbs, GMO foods.

## 2014-06-16 ENCOUNTER — Telehealth: Payer: Self-pay | Admitting: Family Medicine

## 2014-06-16 NOTE — Telephone Encounter (Signed)
Caller name:[facility] Relationship to patient:Pharmacy Can be reached:[phone] or fax [phone] Pharmacy:  Reason for call: Need correct dosage and quantity /day supply of b12

## 2014-06-21 ENCOUNTER — Other Ambulatory Visit: Payer: Self-pay | Admitting: Family Medicine

## 2014-06-21 NOTE — Telephone Encounter (Signed)
Last filled: 04/14/14 Amt: 90, 0 refills Last OV:  06/02/14  Med filled.

## 2014-06-21 NOTE — Telephone Encounter (Signed)
Spoke with pharmacy and corrected to 90 days supply with 3 refills. Per chart.

## 2014-06-23 ENCOUNTER — Encounter: Payer: Self-pay | Admitting: Family Medicine

## 2014-06-23 ENCOUNTER — Ambulatory Visit (INDEPENDENT_AMBULATORY_CARE_PROVIDER_SITE_OTHER): Payer: 59 | Admitting: Family Medicine

## 2014-06-23 VITALS — BP 117/80 | HR 65 | Temp 97.2°F | Ht 65.75 in | Wt 258.0 lb

## 2014-06-23 DIAGNOSIS — B029 Zoster without complications: Secondary | ICD-10-CM

## 2014-06-23 MED ORDER — PREDNISONE 20 MG PO TABS: ORAL_TABLET | ORAL | Status: DC

## 2014-06-23 MED ORDER — VALACYCLOVIR HCL 1 G PO TABS: ORAL_TABLET | ORAL | Status: DC

## 2014-06-23 MED ORDER — HYDROCODONE-ACETAMINOPHEN 5-325 MG PO TABS: 1.0000 | ORAL_TABLET | Freq: Four times a day (QID) | ORAL | Status: DC | PRN

## 2014-06-23 NOTE — Progress Notes (Signed)
Pre visit review using our clinic review tool, if applicable. No additional management support is needed unless otherwise documented below in the visit note. 

## 2014-06-23 NOTE — Progress Notes (Signed)
OFFICE NOTE  06/23/2014  CC: Pain in back: ? Shingles?  HPI: Patient is a 49 y.o. Caucasian female who is here for about 5d of pain in right side of back in bra-line level.  Deep, achy type pain, constant.  Some movements make it worse, and lately it is sensitive to touch.   Two nights ago she began to note a few little bumps on the skin of this area.  She feels like some additional vesicles may have come up today.  Feeling tired, but no fever.  Took ibuprofen and it hasn't helped.    Pertinent PMH:  PMH and PSH reviewed  MEDS:  Outpatient Prescriptions Prior to Visit  Medication Sig Dispense Refill  . cyanocobalamin (,VITAMIN B-12,) 1000 MCG/ML injection Inject 1 mL (1,000 mcg total) into the muscle once. 10 mL 5  . levonorgestrel (MIRENA) 20 MCG/24HR IUD 1 each by Intrauterine route once.    Marland Kitchen. levothyroxine (SYNTHROID, LEVOTHROID) 150 MCG tablet Take 1 tablet (150 mcg  total) by mouth daily  before breakfast. 90 tablet 4  . Omega-3 Fatty Acids (OMEGA 3 PO) Take by mouth daily.    . sertraline (ZOLOFT) 100 MG tablet Take 1 tablet by mouth  daily 90 tablet 0   No facility-administered medications prior to visit.    PE: Blood pressure 117/80, pulse 65, temperature 97.2 F (36.2 C), temperature source Temporal, height 5' 5.75" (1.67 m), weight 258 lb (117.028 kg), SpO2 97 %.  Pt examined with Faythe GheeLisa Albright, CMA, as chaperone. Gen: Alert, well appearing.  Patient is oriented to person, place, time, and situation. SKIN: Right paraspinous region at T10 level has a few small patches of pinkish papulovesicles on pink background.  This trails just a few inches towards the right side of her back.  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 Valtrex 1g tid x 7d. Prednisone 40mg  qd x 3d, then 20mg  qd x 3d, then 10mg  qd x 4d. Vicodin 5/325, 1-2 q6h prn, #30.  Therapeutic expectations and side effect profile of medication discussed today.  Patient's questions  answered. Skin care, general evolution/course of illness discussed with pt.  An After Visit Summary was printed and given to the patient.  FOLLOW UP: prn

## 2014-08-19 ENCOUNTER — Other Ambulatory Visit: Payer: Self-pay | Admitting: Family Medicine

## 2014-12-18 ENCOUNTER — Other Ambulatory Visit: Payer: Self-pay | Admitting: Family Medicine

## 2014-12-19 ENCOUNTER — Other Ambulatory Visit: Payer: Self-pay | Admitting: *Deleted

## 2014-12-19 MED ORDER — SERTRALINE HCL 100 MG PO TABS
ORAL_TABLET | ORAL | Status: DC
Start: 2014-12-19 — End: 2015-08-14

## 2014-12-19 NOTE — Telephone Encounter (Signed)
Rx for Zoloft resent to [facility].  Rx sent on 12/08/14 but transmission failed.

## 2015-01-02 ENCOUNTER — Telehealth: Payer: Self-pay | Admitting: Family Medicine

## 2015-01-02 ENCOUNTER — Other Ambulatory Visit: Payer: Self-pay | Admitting: Family Medicine

## 2015-01-02 ENCOUNTER — Other Ambulatory Visit: Payer: Self-pay

## 2015-01-02 NOTE — Telephone Encounter (Signed)
error 

## 2015-03-13 ENCOUNTER — Ambulatory Visit (INDEPENDENT_AMBULATORY_CARE_PROVIDER_SITE_OTHER): Payer: 59 | Admitting: Pulmonary Disease

## 2015-03-13 ENCOUNTER — Encounter: Payer: Self-pay | Admitting: Pulmonary Disease

## 2015-03-13 VITALS — BP 120/70 | HR 90 | Ht 65.0 in | Wt 256.5 lb

## 2015-03-13 DIAGNOSIS — Z23 Encounter for immunization: Secondary | ICD-10-CM

## 2015-03-13 DIAGNOSIS — G4733 Obstructive sleep apnea (adult) (pediatric): Secondary | ICD-10-CM

## 2015-03-13 NOTE — Patient Instructions (Signed)
CPAP supplies to be renewed x 1 year Your CPAP is set at 8 cm Flu shot

## 2015-03-13 NOTE — Assessment & Plan Note (Signed)
Weight loss encouraged, compliance with goal of at least 4-6 hrs every night is the expectation. Advised against medications with sedative side effects Cautioned against driving when sleepy - understanding that sleepiness will vary on a day to day basis  CPAP supplies to be renewed x 1 year

## 2015-03-13 NOTE — Progress Notes (Signed)
   Subjective:    Patient ID: Tiffany Morris, female    DOB: 03/18/1966, 49 y.o.   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 Sleep Apnea    pt. states she wears CPAP every night 7-8 hours a night. pressure is good. no supplies needed at this time. DME:[facility]  [place] pt  ON CPAP ,FF mask Download 02/2015 >> on 8 cm, no residuals, good usage, no leak  Mask ok, pr ok, has gotten used to it, no dryness  NPSG 07/2013:  AHI 24/hr, cpap to 8 cm.   Review of Systems neg for any significant sore throat, dysphagia, itching, sneezing, nasal congestion or excess/ purulent secretions, fever, chills, sweats, unintended wt loss, pleuritic or exertional cp, hempoptysis, orthopnea pnd or change in chronic leg swelling. Also denies presyncope, palpitations, heartburn, abdominal pain, nausea, vomiting, diarrhea or change in bowel or urinary habits, dysuria,hematuria, rash, arthralgias, visual complaints, headache, numbness weakness or ataxia.     Objective:   Physical Exam  Gen. Pleasant, obese, in no distress ENT - no lesions, no post nasal drip Neck: No JVD, no thyromegaly, no carotid bruits Lungs: no use of accessory muscles, no dullness to percussion, decreased without rales or rhonchi  Cardiovascular: Rhythm regular, heart sounds  normal, no murmurs or gallops, no peripheral edema Musculoskeletal: No deformities, no cyanosis or clubbing , no tremors       Assessment & Plan:

## 2015-03-14 ENCOUNTER — Ambulatory Visit: Payer: 59 | Admitting: Pulmonary Disease

## 2015-03-28 ENCOUNTER — Encounter: Payer: Self-pay | Admitting: Pulmonary Disease

## 2015-04-07 ENCOUNTER — Other Ambulatory Visit: Payer: Self-pay | Admitting: Pulmonary Disease

## 2015-04-07 DIAGNOSIS — Z9989 Dependence on other enabling machines and devices: Principal | ICD-10-CM

## 2015-04-07 DIAGNOSIS — G4733 Obstructive sleep apnea (adult) (pediatric): Secondary | ICD-10-CM

## 2015-04-10 ENCOUNTER — Ambulatory Visit: Payer: 59 | Admitting: Pulmonary Disease

## 2015-04-17 ENCOUNTER — Other Ambulatory Visit: Payer: Self-pay | Admitting: Family Medicine

## 2015-06-06 ENCOUNTER — Encounter: Payer: Self-pay | Admitting: Family Medicine

## 2015-06-06 ENCOUNTER — Ambulatory Visit (INDEPENDENT_AMBULATORY_CARE_PROVIDER_SITE_OTHER): Payer: 59 | Admitting: Family Medicine

## 2015-06-06 ENCOUNTER — Telehealth: Payer: Self-pay | Admitting: Behavioral Health

## 2015-06-06 ENCOUNTER — Encounter: Payer: Self-pay | Admitting: Behavioral Health

## 2015-06-06 VITALS — HR 90 | Temp 98.6°F | Ht 65.0 in | Wt 260.5 lb

## 2015-06-06 DIAGNOSIS — Z1239 Encounter for other screening for malignant neoplasm of breast: Secondary | ICD-10-CM

## 2015-06-06 DIAGNOSIS — E669 Obesity, unspecified: Secondary | ICD-10-CM | POA: Diagnosis not present

## 2015-06-06 DIAGNOSIS — E0580 Other thyrotoxicosis without thyrotoxic crisis or storm: Secondary | ICD-10-CM

## 2015-06-06 DIAGNOSIS — Z0000 Encounter for general adult medical examination without abnormal findings: Secondary | ICD-10-CM

## 2015-06-06 DIAGNOSIS — R739 Hyperglycemia, unspecified: Secondary | ICD-10-CM | POA: Diagnosis not present

## 2015-06-06 DIAGNOSIS — G4730 Sleep apnea, unspecified: Secondary | ICD-10-CM

## 2015-06-06 DIAGNOSIS — Z8639 Personal history of other endocrine, nutritional and metabolic disease: Secondary | ICD-10-CM

## 2015-06-06 DIAGNOSIS — E785 Hyperlipidemia, unspecified: Secondary | ICD-10-CM

## 2015-06-06 DIAGNOSIS — E538 Deficiency of other specified B group vitamins: Secondary | ICD-10-CM | POA: Diagnosis not present

## 2015-06-06 NOTE — Assessment & Plan Note (Signed)
Wears her CPAP machine nightly, sleeping.

## 2015-06-06 NOTE — Assessment & Plan Note (Signed)
Doing well on Sertraline no changes 

## 2015-06-06 NOTE — Assessment & Plan Note (Signed)
Check level today 

## 2015-06-06 NOTE — Progress Notes (Signed)
Pre visit review using our clinic review tool, if applicable. No additional management support is needed unless otherwise documented below in the visit note. 

## 2015-06-06 NOTE — Assessment & Plan Note (Signed)
Encouraged increased hydration, 64 ounces of clear fluids daily. Minimize alcohol and caffeine. Eat small frequent meals with lean proteins and complex carbs. Avoid high and low blood sugars. Get adequate sleep, 7-8 hours a night. Needs exercise daily preferably in the morning. Check labs today

## 2015-06-06 NOTE — Assessment & Plan Note (Signed)
Patient encouraged to maintain heart healthy diet, regular exercise, adequate sleep. Consider daily probiotics. Take medications as prescribed 

## 2015-06-06 NOTE — Patient Instructions (Signed)
Preventive Care for Adults, Female A healthy lifestyle and preventive care can promote health and wellness. Preventive health guidelines for women include the following key practices.  A routine yearly physical is a good way to check with your health care provider about your health and preventive screening. It is a chance to share any concerns and updates on your health and to receive a thorough exam.  Visit your dentist for a routine exam and preventive care every 6 months. Brush your teeth twice a day and floss once a day. Good oral hygiene prevents tooth decay and gum disease.  The frequency of eye exams is based on your age, health, family medical history, use of contact lenses, and other factors. Follow your health care provider's recommendations for frequency of eye exams.  Eat a healthy diet. Foods like vegetables, fruits, whole grains, low-fat dairy products, and lean protein foods contain the nutrients you need without too many calories. Decrease your intake of foods high in solid fats, added sugars, and salt. Eat the right amount of calories for you.Get information about a proper diet from your health care provider, if necessary.  Regular physical exercise is one of the most important things you can do for your health. Most adults should get at least 150 minutes of moderate-intensity exercise (any activity that increases your heart rate and causes you to sweat) each week. In addition, most adults need muscle-strengthening exercises on 2 or more days a week.  Maintain a healthy weight. The body mass index (BMI) is a screening tool to identify possible weight problems. It provides an estimate of body fat based on height and weight. Your health care provider can find your BMI and can help you achieve or maintain a healthy weight.For adults 20 years and older:  A BMI below 18.5 is considered underweight.  A BMI of 18.5 to 24.9 is normal.  A BMI of 25 to 29.9 is considered overweight.  A  BMI of 30 and above is considered obese.  Maintain normal blood lipids and cholesterol levels by exercising and minimizing your intake of saturated fat. Eat a balanced diet with plenty of fruit and vegetables. Blood tests for lipids and cholesterol should begin at age 45 and be repeated every 5 years. If your lipid or cholesterol levels are high, you are over 50, or you are at high risk for heart disease, you may need your cholesterol levels checked more frequently.Ongoing high lipid and cholesterol levels should be treated with medicines if diet and exercise are not working.  If you smoke, find out from your health care provider how to quit. If you do not use tobacco, do not start.  Lung cancer screening is recommended for adults aged 45-80 years who are at high risk for developing lung cancer because of a history of smoking. A yearly low-dose CT scan of the lungs is recommended for people who have at least a 30-pack-year history of smoking and are a current smoker or have quit within the past 15 years. A pack year of smoking is smoking an average of 1 pack of cigarettes a day for 1 year (for example: 1 pack a day for 30 years or 2 packs a day for 15 years). Yearly screening should continue until the smoker has stopped smoking for at least 15 years. Yearly screening should be stopped for people who develop a health problem that would prevent them from having lung cancer treatment.  If you are pregnant, do not drink alcohol. If you are  breastfeeding, be very cautious about drinking alcohol. If you are not pregnant and choose to drink alcohol, do not have more than 1 drink per day. One drink is considered to be 12 ounces (355 mL) of beer, 5 ounces (148 mL) of wine, or 1.5 ounces (44 mL) of liquor.  Avoid use of street drugs. Do not share needles with anyone. Ask for help if you need support or instructions about stopping the use of drugs.  High blood pressure causes heart disease and increases the risk  of stroke. Your blood pressure should be checked at least every 1 to 2 years. Ongoing high blood pressure should be treated with medicines if weight loss and exercise do not work.  If you are 55-79 years old, ask your health care provider if you should take aspirin to prevent strokes.  Diabetes screening is done by taking a blood sample to check your blood glucose level after you have not eaten for a certain period of time (fasting). If you are not overweight and you do not have risk factors for diabetes, you should be screened once every 3 years starting at age 45. If you are overweight or obese and you are 40-70 years of age, you should be screened for diabetes every year as part of your cardiovascular risk assessment.  Breast cancer screening is essential preventive care for women. You should practice "breast self-awareness." This means understanding the normal appearance and feel of your breasts and may include breast self-examination. Any changes detected, no matter how small, should be reported to a health care provider. Women in their 20s and 30s should have a clinical breast exam (CBE) by a health care provider as part of a regular health exam every 1 to 3 years. After age 40, women should have a CBE every year. Starting at age 40, women should consider having a mammogram (breast X-ray test) every year. Women who have a family history of breast cancer should talk to their health care provider about genetic screening. Women at a high risk of breast cancer should talk to their health care providers about having an MRI and a mammogram every year.  Breast cancer gene (BRCA)-related cancer risk assessment is recommended for women who have family members with BRCA-related cancers. BRCA-related cancers include breast, ovarian, tubal, and peritoneal cancers. Having family members with these cancers may be associated with an increased risk for harmful changes (mutations) in the breast cancer genes BRCA1 and  BRCA2. Results of the assessment will determine the need for genetic counseling and BRCA1 and BRCA2 testing.  Your health care provider may recommend that you be screened regularly for cancer of the pelvic organs (ovaries, uterus, and vagina). This screening involves a pelvic examination, including checking for microscopic changes to the surface of your cervix (Pap test). You may be encouraged to have this screening done every 3 years, beginning at age 21.  For women ages 30-65, health care providers may recommend pelvic exams and Pap testing every 3 years, or they may recommend the Pap and pelvic exam, combined with testing for human papilloma virus (HPV), every 5 years. Some types of HPV increase your risk of cervical cancer. Testing for HPV may also be done on women of any age with unclear Pap test results.  Other health care providers may not recommend any screening for nonpregnant women who are considered low risk for pelvic cancer and who do not have symptoms. Ask your health care provider if a screening pelvic exam is right for   you.  If you have had past treatment for cervical cancer or a condition that could lead to cancer, you need Pap tests and screening for cancer for at least 20 years after your treatment. If Pap tests have been discontinued, your risk factors (such as having a new sexual partner) need to be reassessed to determine if screening should resume. Some women have medical problems that increase the chance of getting cervical cancer. In these cases, your health care provider may recommend more frequent screening and Pap tests.  Colorectal cancer can be detected and often prevented. Most routine colorectal cancer screening begins at the age of 50 years and continues through age 75 years. However, your health care provider may recommend screening at an earlier age if you have risk factors for colon cancer. On a yearly basis, your health care provider may provide home test kits to check  for hidden blood in the stool. Use of a small camera at the end of a tube, to directly examine the colon (sigmoidoscopy or colonoscopy), can detect the earliest forms of colorectal cancer. Talk to your health care provider about this at age 50, when routine screening begins. Direct exam of the colon should be repeated every 5-10 years through age 75 years, unless early forms of precancerous polyps or small growths are found.  People who are at an increased risk for hepatitis B should be screened for this virus. You are considered at high risk for hepatitis B if:  You were born in a country where hepatitis B occurs often. Talk with your health care provider about which countries are considered high risk.  Your parents were born in a high-risk country and you have not received a shot to protect against hepatitis B (hepatitis B vaccine).  You have HIV or AIDS.  You use needles to inject street drugs.  You live with, or have sex with, someone who has hepatitis B.  You get hemodialysis treatment.  You take certain medicines for conditions like cancer, organ transplantation, and autoimmune conditions.  Hepatitis C blood testing is recommended for all people born from 1945 through 1965 and any individual with known risks for hepatitis C.  Practice safe sex. Use condoms and avoid high-risk sexual practices to reduce the spread of sexually transmitted infections (STIs). STIs include gonorrhea, chlamydia, syphilis, trichomonas, herpes, HPV, and human immunodeficiency virus (HIV). Herpes, HIV, and HPV are viral illnesses that have no cure. They can result in disability, cancer, and death.  You should be screened for sexually transmitted illnesses (STIs) including gonorrhea and chlamydia if:  You are sexually active and are younger than 24 years.  You are older than 24 years and your health care provider tells you that you are at risk for this type of infection.  Your sexual activity has changed  since you were last screened and you are at an increased risk for chlamydia or gonorrhea. Ask your health care provider if you are at risk.  If you are at risk of being infected with HIV, it is recommended that you take a prescription medicine daily to prevent HIV infection. This is called preexposure prophylaxis (PrEP). You are considered at risk if:  You are sexually active and do not regularly use condoms or know the HIV status of your partner(s).  You take drugs by injection.  You are sexually active with a partner who has HIV.  Talk with your health care provider about whether you are at high risk of being infected with HIV. If   you choose to begin PrEP, you should first be tested for HIV. You should then be tested every 3 months for as long as you are taking PrEP.  Osteoporosis is a disease in which the bones lose minerals and strength with aging. This can result in serious bone fractures or breaks. The risk of osteoporosis can be identified using a bone density scan. Women ages 67 years and over and women at risk for fractures or osteoporosis should discuss screening with their health care providers. Ask your health care provider whether you should take a calcium supplement or vitamin D to reduce the rate of osteoporosis.  Menopause can be associated with physical symptoms and risks. Hormone replacement therapy is available to decrease symptoms and risks. You should talk to your health care provider about whether hormone replacement therapy is right for you.  Use sunscreen. Apply sunscreen liberally and repeatedly throughout the day. You should seek shade when your shadow is shorter than you. Protect yourself by wearing long sleeves, pants, a wide-brimmed hat, and sunglasses year round, whenever you are outdoors.  Once a month, do a whole body skin exam, using a mirror to look at the skin on your back. Tell your health care provider of new moles, moles that have irregular borders, moles that  are larger than a pencil eraser, or moles that have changed in shape or color.  Stay current with required vaccines (immunizations).  Influenza vaccine. All adults should be immunized every year.  Tetanus, diphtheria, and acellular pertussis (Td, Tdap) vaccine. Pregnant women should receive 1 dose of Tdap vaccine during each pregnancy. The dose should be obtained regardless of the length of time since the last dose. Immunization is preferred during the 27th-36th week of gestation. An adult who has not previously received Tdap or who does not know her vaccine status should receive 1 dose of Tdap. This initial dose should be followed by tetanus and diphtheria toxoids (Td) booster doses every 10 years. Adults with an unknown or incomplete history of completing a 3-dose immunization series with Td-containing vaccines should begin or complete a primary immunization series including a Tdap dose. Adults should receive a Td booster every 10 years.  Varicella vaccine. An adult without evidence of immunity to varicella should receive 2 doses or a second dose if she has previously received 1 dose. Pregnant females who do not have evidence of immunity should receive the first dose after pregnancy. This first dose should be obtained before leaving the health care facility. The second dose should be obtained 4-8 weeks after the first dose.  Human papillomavirus (HPV) vaccine. Females aged 13-26 years who have not received the vaccine previously should obtain the 3-dose series. The vaccine is not recommended for use in pregnant females. However, pregnancy testing is not needed before receiving a dose. If a female is found to be pregnant after receiving a dose, no treatment is needed. In that case, the remaining doses should be delayed until after the pregnancy. Immunization is recommended for any person with an immunocompromised condition through the age of 61 years if she did not get any or all doses earlier. During the  3-dose series, the second dose should be obtained 4-8 weeks after the first dose. The third dose should be obtained 24 weeks after the first dose and 16 weeks after the second dose.  Zoster vaccine. One dose is recommended for adults aged 30 years or older unless certain conditions are present.  Measles, mumps, and rubella (MMR) vaccine. Adults born  before 1957 generally are considered immune to measles and mumps. Adults born in 1957 or later should have 1 or more doses of MMR vaccine unless there is a contraindication to the vaccine or there is laboratory evidence of immunity to each of the three diseases. A routine second dose of MMR vaccine should be obtained at least 28 days after the first dose for students attending postsecondary schools, health care workers, or international travelers. People who received inactivated measles vaccine or an unknown type of measles vaccine during 1963-1967 should receive 2 doses of MMR vaccine. People who received inactivated mumps vaccine or an unknown type of mumps vaccine before 1979 and are at high risk for mumps infection should consider immunization with 2 doses of MMR vaccine. For females of childbearing age, rubella immunity should be determined. If there is no evidence of immunity, females who are not pregnant should be vaccinated. If there is no evidence of immunity, females who are pregnant should delay immunization until after pregnancy. Unvaccinated health care workers born before 1957 who lack laboratory evidence of measles, mumps, or rubella immunity or laboratory confirmation of disease should consider measles and mumps immunization with 2 doses of MMR vaccine or rubella immunization with 1 dose of MMR vaccine.  Pneumococcal 13-valent conjugate (PCV13) vaccine. When indicated, a person who is uncertain of his immunization history and has no record of immunization should receive the PCV13 vaccine. All adults 65 years of age and older should receive this  vaccine. An adult aged 19 years or older who has certain medical conditions and has not been previously immunized should receive 1 dose of PCV13 vaccine. This PCV13 should be followed with a dose of pneumococcal polysaccharide (PPSV23) vaccine. Adults who are at high risk for pneumococcal disease should obtain the PPSV23 vaccine at least 8 weeks after the dose of PCV13 vaccine. Adults older than 48 years of age who have normal immune system function should obtain the PPSV23 vaccine dose at least 1 year after the dose of PCV13 vaccine.  Pneumococcal polysaccharide (PPSV23) vaccine. When PCV13 is also indicated, PCV13 should be obtained first. All adults aged 65 years and older should be immunized. An adult younger than age 65 years who has certain medical conditions should be immunized. Any person who resides in a nursing home or long-term care facility should be immunized. An adult smoker should be immunized. People with an immunocompromised condition and certain other conditions should receive both PCV13 and PPSV23 vaccines. People with human immunodeficiency virus (HIV) infection should be immunized as soon as possible after diagnosis. Immunization during chemotherapy or radiation therapy should be avoided. Routine use of PPSV23 vaccine is not recommended for American Indians, [place] Natives, or people younger than 65 years unless there are medical conditions that require PPSV23 vaccine. When indicated, people who have unknown immunization and have no record of immunization should receive PPSV23 vaccine. One-time revaccination 5 years after the first dose of PPSV23 is recommended for people aged 19-64 years who have chronic kidney failure, nephrotic syndrome, asplenia, or immunocompromised conditions. People who received 1-2 doses of PPSV23 before age 65 years should receive another dose of PPSV23 vaccine at age 65 years or later if at least 5 years have passed since the previous dose. Doses of PPSV23 are not  needed for people immunized with PPSV23 at or after age 65 years.  Meningococcal vaccine. Adults with asplenia or persistent complement component deficiencies should receive 2 doses of quadrivalent meningococcal conjugate (MenACWY-D) vaccine. The doses should be obtained   at least 2 months apart. Microbiologists working with certain meningococcal bacteria, [place] recruits, people at risk during an outbreak, and people who travel to or live in countries with a high rate of meningitis should be immunized. A first-year college student up through age 34 years who is living in a residence hall should receive a dose if she did not receive a dose on or after her 16th birthday. Adults who have certain high-risk conditions should receive one or more doses of vaccine.  Hepatitis A vaccine. Adults who wish to be protected from this disease, have certain high-risk conditions, work with hepatitis A-infected animals, work in hepatitis A research labs, or travel to or work in countries with a high rate of hepatitis A should be immunized. Adults who were previously unvaccinated and who anticipate close contact with an international adoptee during the first 60 days after arrival in the [place] from a country with a high rate of hepatitis A should be immunized.  Hepatitis B vaccine. Adults who wish to be protected from this disease, have certain high-risk conditions, may be exposed to blood or other infectious body fluids, are household contacts or sex partners of hepatitis B positive people, are clients or workers in certain care facilities, or travel to or work in countries with a high rate of hepatitis B should be immunized.  Haemophilus influenzae type b (Hib) vaccine. A previously unvaccinated person with asplenia or sickle cell disease or having a scheduled splenectomy should receive 1 dose of Hib vaccine. Regardless of previous immunization, a recipient of a hematopoietic stem cell transplant should receive a  3-dose series 6-12 months after her successful transplant. Hib vaccine is not recommended for adults with HIV infection. Preventive Services / Frequency Ages 35 to 4 years  Blood pressure check.** / Every 3-5 years.  Lipid and cholesterol check.** / Every 5 years beginning at age 60.  Clinical breast exam.** / Every 3 years for women in their 71s and 10s.  BRCA-related cancer risk assessment.** / For women who have family members with a BRCA-related cancer (breast, ovarian, tubal, or peritoneal cancers).  Pap test.** / Every 2 years from ages 76 through 26. Every 3 years starting at age 61 through age 76 or 93 with a history of 3 consecutive normal Pap tests.  HPV screening.** / Every 3 years from ages 37 through ages 60 to 51 with a history of 3 consecutive normal Pap tests.  Hepatitis C blood test.** / For any individual with known risks for hepatitis C.  Skin self-exam. / Monthly.  Influenza vaccine. / Every year.  Tetanus, diphtheria, and acellular pertussis (Tdap, Td) vaccine.** / Consult your health care provider. Pregnant women should receive 1 dose of Tdap vaccine during each pregnancy. 1 dose of Td every 10 years.  Varicella vaccine.** / Consult your health care provider. Pregnant females who do not have evidence of immunity should receive the first dose after pregnancy.  HPV vaccine. / 3 doses over 6 months, if 93 and younger. The vaccine is not recommended for use in pregnant females. However, pregnancy testing is not needed before receiving a dose.  Measles, mumps, rubella (MMR) vaccine.** / You need at least 1 dose of MMR if you were born in 1957 or later. You may also need a 2nd dose. For females of childbearing age, rubella immunity should be determined. If there is no evidence of immunity, females who are not pregnant should be vaccinated. If there is no evidence of immunity, females who are  pregnant should delay immunization until after pregnancy.  Pneumococcal  13-valent conjugate (PCV13) vaccine.** / Consult your health care provider.  Pneumococcal polysaccharide (PPSV23) vaccine.** / 1 to 2 doses if you smoke cigarettes or if you have certain conditions.  Meningococcal vaccine.** / 1 dose if you are age 68 to 8 years and a Market researcher living in a residence hall, or have one of several medical conditions, you need to get vaccinated against meningococcal disease. You may also need additional booster doses.  Hepatitis A vaccine.** / Consult your health care provider.  Hepatitis B vaccine.** / Consult your health care provider.  Haemophilus influenzae type b (Hib) vaccine.** / Consult your health care provider. Ages 7 to 53 years  Blood pressure check.** / Every year.  Lipid and cholesterol check.** / Every 5 years beginning at age 25 years.  Lung cancer screening. / Every year if you are aged 11-80 years and have a 30-pack-year history of smoking and currently smoke or have quit within the past 15 years. Yearly screening is stopped once you have quit smoking for at least 15 years or develop a health problem that would prevent you from having lung cancer treatment.  Clinical breast exam.** / Every year after age 48 years.  BRCA-related cancer risk assessment.** / For women who have family members with a BRCA-related cancer (breast, ovarian, tubal, or peritoneal cancers).  Mammogram.** / Every year beginning at age 41 years and continuing for as long as you are in good health. Consult with your health care provider.  Pap test.** / Every 3 years starting at age 65 years through age 37 or 70 years with a history of 3 consecutive normal Pap tests.  HPV screening.** / Every 3 years from ages 72 years through ages 60 to 40 years with a history of 3 consecutive normal Pap tests.  Fecal occult blood test (FOBT) of stool. / Every year beginning at age 21 years and continuing until age 5 years. You may not need to do this test if you get  a colonoscopy every 10 years.  Flexible sigmoidoscopy or colonoscopy.** / Every 5 years for a flexible sigmoidoscopy or every 10 years for a colonoscopy beginning at age 35 years and continuing until age 48 years.  Hepatitis C blood test.** / For all people born from [birth year] through 1965 and any individual with known risks for hepatitis C.  Skin self-exam. / Monthly.  Influenza vaccine. / Every year.  Tetanus, diphtheria, and acellular pertussis (Tdap/Td) vaccine.** / Consult your health care provider. Pregnant women should receive 1 dose of Tdap vaccine during each pregnancy. 1 dose of Td every 10 years.  Varicella vaccine.** / Consult your health care provider. Pregnant females who do not have evidence of immunity should receive the first dose after pregnancy.  Zoster vaccine.** / 1 dose for adults aged 30 years or older.  Measles, mumps, rubella (MMR) vaccine.** / You need at least 1 dose of MMR if you were born in 1957 or later. You may also need a second dose. For females of childbearing age, rubella immunity should be determined. If there is no evidence of immunity, females who are not pregnant should be vaccinated. If there is no evidence of immunity, females who are pregnant should delay immunization until after pregnancy.  Pneumococcal 13-valent conjugate (PCV13) vaccine.** / Consult your health care provider.  Pneumococcal polysaccharide (PPSV23) vaccine.** / 1 to 2 doses if you smoke cigarettes or if you have certain conditions.  Meningococcal vaccine.** /  Consult your health care provider.  Hepatitis A vaccine.** / Consult your health care provider.  Hepatitis B vaccine.** / Consult your health care provider.  Haemophilus influenzae type b (Hib) vaccine.** / Consult your health care provider. Ages 16 years and over  Blood pressure check.** / Every year.  Lipid and cholesterol check.** / Every 5 years beginning at age 29 years.  Lung cancer screening. / Every year if you  are aged 71-80 years and have a 30-pack-year history of smoking and currently smoke or have quit within the past 15 years. Yearly screening is stopped once you have quit smoking for at least 15 years or develop a health problem that would prevent you from having lung cancer treatment.  Clinical breast exam.** / Every year after age 42 years.  BRCA-related cancer risk assessment.** / For women who have family members with a BRCA-related cancer (breast, ovarian, tubal, or peritoneal cancers).  Mammogram.** / Every year beginning at age 44 years and continuing for as long as you are in good health. Consult with your health care provider.  Pap test.** / Every 3 years starting at age 46 years through age 46 or 16 years with 3 consecutive normal Pap tests. Testing can be stopped between 65 and 70 years with 3 consecutive normal Pap tests and no abnormal Pap or HPV tests in the past 10 years.  HPV screening.** / Every 3 years from ages 78 years through ages 52 or 34 years with a history of 3 consecutive normal Pap tests. Testing can be stopped between 65 and 70 years with 3 consecutive normal Pap tests and no abnormal Pap or HPV tests in the past 10 years.  Fecal occult blood test (FOBT) of stool. / Every year beginning at age 64 years and continuing until age 65 years. You may not need to do this test if you get a colonoscopy every 10 years.  Flexible sigmoidoscopy or colonoscopy.** / Every 5 years for a flexible sigmoidoscopy or every 10 years for a colonoscopy beginning at age 45 years and continuing until age 34 years.  Hepatitis C blood test.** / For all people born from [birth year] through 1963 and any individual with known risks for hepatitis C.  Osteoporosis screening.** / A one-time screening for women ages 72 years and over and women at risk for fractures or osteoporosis.  Skin self-exam. / Monthly.  Influenza vaccine. / Every year.  Tetanus, diphtheria, and acellular pertussis (Tdap/Td)  vaccine.** / 1 dose of Td every 10 years.  Varicella vaccine.** / Consult your health care provider.  Zoster vaccine.** / 1 dose for adults aged 71 years or older.  Pneumococcal 13-valent conjugate (PCV13) vaccine.** / Consult your health care provider.  Pneumococcal polysaccharide (PPSV23) vaccine.** / 1 dose for all adults aged 56 years and older.  Meningococcal vaccine.** / Consult your health care provider.  Hepatitis A vaccine.** / Consult your health care provider.  Hepatitis B vaccine.** / Consult your health care provider.  Haemophilus influenzae type b (Hib) vaccine.** / Consult your health care provider. ** Family history and personal history of risk and conditions may change your health care provider's recommendations.   This information is not intended to replace advice given to you by your health care provider. Make sure you discuss any questions you have with your health care provider.   Document Released: 07/09/2001 Document Revised: 06/03/2014 Document Reviewed: 10/08/2010 Elsevier Interactive Patient Education 2016 [place] DASH stands for "Dietary Approaches to Stop Hypertension." The  DASH eating plan is a healthy eating plan that has been shown to reduce high blood pressure (hypertension). Additional health benefits may include reducing the risk of type 2 diabetes mellitus, heart disease, and stroke. The DASH eating plan may also help with weight loss. WHAT DO I NEED TO KNOW ABOUT THE DASH EATING PLAN? For the DASH eating plan, you will follow these general guidelines:  Choose foods with a percent daily value for sodium of less than 5% (as listed on the food label).  Use salt-free seasonings or herbs instead of table salt or sea salt.  Check with your health care provider or pharmacist before using salt substitutes.  Eat lower-sodium products, often labeled as "lower sodium" or "no salt added."  Eat fresh foods.  Eat more vegetables,  fruits, and low-fat dairy products.  Choose whole grains. Look for the word "whole" as the first word in the ingredient list.  Choose fish and skinless chicken or [place] more often than red meat. Limit fish, poultry, and meat to 6 oz (170 g) each day.  Limit sweets, desserts, sugars, and sugary drinks.  Choose heart-healthy fats.  Limit cheese to 1 oz (28 g) per day.  Eat more home-cooked food and less restaurant, buffet, and fast food.  Limit fried foods.  Cook foods using methods other than frying.  Limit canned vegetables. If you do use them, rinse them well to decrease the sodium.  When eating at a restaurant, ask that your food be prepared with less salt, or no salt if possible. WHAT FOODS CAN I EAT? Seek help from a dietitian for individual calorie needs. Grains Whole grain or whole wheat bread. Brown rice. Whole grain or whole wheat pasta. Quinoa, bulgur, and whole grain cereals. Low-sodium cereals. Corn or whole wheat flour tortillas. Whole grain cornbread. Whole grain crackers. Low-sodium crackers. Vegetables Fresh or frozen vegetables (raw, steamed, roasted, or grilled). Low-sodium or reduced-sodium tomato and vegetable juices. Low-sodium or reduced-sodium tomato sauce and paste. Low-sodium or reduced-sodium canned vegetables.  Fruits All fresh, canned (in natural juice), or frozen fruits. Meat and Other Protein Products Ground beef (85% or leaner), grass-fed beef, or beef trimmed of fat. Skinless chicken or [place]. Ground chicken or [place]. Pork trimmed of fat. All fish and seafood. Eggs. Dried beans, peas, or lentils. Unsalted nuts and seeds. Unsalted canned beans. Dairy Low-fat dairy products, such as skim or 1% milk, 2% or reduced-fat cheeses, low-fat ricotta or cottage cheese, or plain low-fat yogurt. Low-sodium or reduced-sodium cheeses. Fats and Oils Tub margarines without trans fats. Light or reduced-fat mayonnaise and salad dressings (reduced sodium). Avocado.  Safflower, olive, or canola oils. Natural peanut or almond butter. Other Unsalted popcorn and pretzels. The items listed above may not be a complete list of recommended foods or beverages. Contact your dietitian for more options. WHAT FOODS ARE NOT RECOMMENDED? Grains White bread. White pasta. White rice. Refined cornbread. Bagels and croissants. Crackers that contain trans fat. Vegetables Creamed or fried vegetables. Vegetables in a cheese sauce. Regular canned vegetables. Regular canned tomato sauce and paste. Regular tomato and vegetable juices. Fruits Dried fruits. Canned fruit in light or heavy syrup. Fruit juice. Meat and Other Protein Products Fatty cuts of meat. Ribs, chicken wings, bacon, sausage, bologna, salami, chitterlings, fatback, hot dogs, bratwurst, and packaged luncheon meats. Salted nuts and seeds. Canned beans with salt. Dairy Whole or 2% milk, cream, half-and-half, and cream cheese. Whole-fat or sweetened yogurt. Full-fat cheeses or blue cheese. Nondairy creamers and whipped toppings. Processed cheese,  cheese spreads, or cheese curds. Condiments Onion and garlic salt, seasoned salt, table salt, and sea salt. Canned and packaged gravies. Worcestershire sauce. Tartar sauce. Barbecue sauce. Teriyaki sauce. Soy sauce, including reduced sodium. Steak sauce. Fish sauce. Oyster sauce. Cocktail sauce. Horseradish. Ketchup and mustard. Meat flavorings and tenderizers. Bouillon cubes. Hot sauce. Tabasco sauce. Marinades. Taco seasonings. Relishes. Fats and Oils Butter, stick margarine, lard, shortening, ghee, and bacon fat. Coconut, palm kernel, or palm oils. Regular salad dressings. Other Pickles and olives. Salted popcorn and pretzels. The items listed above may not be a complete list of foods and beverages to avoid. Contact your dietitian for more information. WHERE CAN I FIND MORE INFORMATION? National Heart, Lung, and Blood Institute:  travelstabloid.com   This information is not intended to replace advice given to you by your health care provider. Make sure you discuss any questions you have with your health care provider.   Document Released: 05/02/2011 Document Revised: 06/03/2014 Document Reviewed: 03/17/2013 Elsevier Interactive Patient Education [employer].

## 2015-06-06 NOTE — Assessment & Plan Note (Signed)
Encouraged DASH diet, decrease po intake and increase exercise as tolerated. Needs 7-8 hours of sleep nightly. Avoid trans fats, eat small, frequent meals every 4-5 hours with lean proteins, complex carbs and healthy fats. Minimize simple carbs, GMO foods. 

## 2015-06-06 NOTE — Telephone Encounter (Signed)
Pre-Visit Call completed with patient and chart updated.   Pre-Visit Info documented in Specialty Comments under [employer].    

## 2015-06-06 NOTE — Progress Notes (Signed)
Patient ID: MILLY GOGGINS, female   DOB: 14-Sep-1965, 50 y.o.   MRN: [id number]   Subjective:    Patient ID: [name], female    DOB: [birth year], 50 y.o.   MRN: [id number]  Chief Complaint  Patient presents with  . Annual Exam    HPI Patient is in today for annual exam and follow up on numerous concerns. No new illness. No hospitalizations. She is struggling with increasing her activity level. Tries to maintain a heart healthy diet. Denies CP/palp/SOB/HA/congestion/fevers/GI or GU c/o. Taking meds as prescribed  Past Medical History  Diagnosis Date  . Chicken pox as a child  . Thyroid disease     hyper  . Vitamin B 12 deficiency   . Obesity   . Depression with anxiety 08/05/2011  . hypothyroidism 06/02/2010  . Pain of left breast 08/05/2011  . Pedal edema 08/05/2011  . Hyperlipidemia 09/05/2011  . Unspecified sleep apnea 08/20/2013  . Hyperglycemia 06/05/2014  . H/O thyrotoxicosis 06/02/2010    Qualifier: Diagnosis of  By: Cathren Harsh MD, Jeannett Senior  H/o of hyper secondary to thyrotoxicosis     Past Surgical History  Procedure Laterality Date  . Cesarean section  2004  . Retinal detachment repair w/ scleral buckle le  01-25-10  . Cholecystectomy      laparoscopic  . Colonoscopy  2014    Family History  Problem Relation Age of Onset  . Hyperlipidemia Mother   . Diabetes Mother     borderline  . Hyperlipidemia Father   . Hypertension Father   . Diabetes Father     type 2  . Hyperthyroidism Father   . Obesity Father   . Cancer Maternal Grandmother     lung- smoker  . COPD Maternal Grandfather     smoked  . Cancer Maternal Grandfather     bladder  . Heart disease Paternal Grandmother     CHF, MI  . Hypothyroidism Paternal Grandmother   . Hypertension Paternal Grandmother   . Hypertension Paternal Grandfather   . Heart disease Paternal Grandfather     open heart surgery  . Hyperlipidemia Paternal Grandfather   . Macular degeneration Paternal Grandfather      Social History   Social History  . Marital Status: Married    Spouse Name: N/A  . Number of Children: N/A  . Years of Education: N/A   Occupational History  . NP [employer]   Social History Main Topics  . Smoking status: Never Smoker   . Smokeless tobacco: Never Used  . Alcohol Use: No  . Drug Use: No  . Sexual Activity: Not Currently     Comment: lives with husband, son age 50, no dietary restrictions.. Nurse Practiitoner with [place]   Other Topics Concern  . Not on file   Social History Narrative    Outpatient Prescriptions Prior to Visit  Medication Sig Dispense Refill  . cyanocobalamin (,VITAMIN B-12,) 1000 MCG/ML injection Inject 1 mL (1,000 mcg total) into the muscle once. 10 mL 5  . levonorgestrel (MIRENA) 20 MCG/24HR IUD 1 each by Intrauterine route once.    Marland Kitchen levothyroxine (SYNTHROID, LEVOTHROID) 150 MCG tablet Take 1 tablet (150 mcg  total) by mouth daily  before breakfast. 90 tablet 4  . Omega-3 Fatty Acids (OMEGA 3 PO) Take by mouth daily.    . sertraline (ZOLOFT) 100 MG tablet Take 1 tablet by mouth  daily 90 tablet 1  . levothyroxine (SYNTHROID, LEVOTHROID) 150 MCG tablet TAKE ONE TABLET BY MOUTH  ONCE DAILY BEFORE BREAKFAST 90 tablet 0   No facility-administered medications prior to visit.    No Known Allergies  Review of Systems  Constitutional: Negative for fever, chills and malaise/fatigue.  HENT: Negative for congestion and hearing loss.   Eyes: Negative for discharge.  Respiratory: Negative for cough, sputum production and shortness of breath.   Cardiovascular: Negative for chest pain, palpitations and leg swelling.  Gastrointestinal: Negative for heartburn, nausea, vomiting, abdominal pain, diarrhea, constipation and blood in stool.  Genitourinary: Negative for dysuria, urgency, frequency and hematuria.  Musculoskeletal: Negative for myalgias, back pain and falls.  Skin: Negative for rash.  Neurological: Negative for dizziness, sensory  change, loss of consciousness, weakness and headaches.  Endo/Heme/Allergies: Negative for environmental allergies. Does not bruise/bleed easily.  Psychiatric/Behavioral: Negative for depression and suicidal ideas. The patient is not nervous/anxious and does not have insomnia.        Objective:    Physical Exam  Constitutional: She is oriented to person, place, and time. She appears well-developed and well-nourished. No distress.  HENT:  Head: Normocephalic and atraumatic.  Eyes: Conjunctivae are normal.  Neck: Neck supple. No thyromegaly present.  Cardiovascular: Normal rate, regular rhythm and normal heart sounds.   No murmur heard. Pulmonary/Chest: Effort normal and breath sounds normal. No respiratory distress.  Abdominal: Soft. Bowel sounds are normal. She exhibits no distension and no mass. There is no tenderness.  Musculoskeletal: She exhibits no edema.  Lymphadenopathy:    She has no cervical adenopathy.  Neurological: She is alert and oriented to person, place, and time.  Skin: Skin is warm and dry.  Psychiatric: She has a normal mood and affect. Her behavior is normal.    Pulse 90  Temp(Src) 98.6 F (37 C) (Oral)  Ht 5\' 5"  (1.651 m)  Wt 260 lb 8 oz (118.162 kg)  BMI 43.35 kg/m2  SpO2 99% Wt Readings from Last 3 Encounters:  06/06/15 260 lb 8 oz (118.162 kg)  03/13/15 256 lb 8 oz (116.348 kg)  06/23/14 258 lb (117.028 kg)     Lab Results  Component Value Date   WBC 9.3 06/06/2015   HGB 14.8 06/06/2015   HCT 44.6 06/06/2015   PLT 278.0 06/06/2015   GLUCOSE 124* 06/06/2015   CHOL 237* 06/06/2015   TRIG 179.0* 06/06/2015   HDL 43.00 06/06/2015   LDLDIRECT 168.3 06/02/2014   LDLCALC 158* 06/06/2015   ALT 17 06/06/2015   AST 16 06/06/2015   NA 139 06/06/2015   K 3.9 06/06/2015   CL 105 06/06/2015   CREATININE 0.83 06/06/2015   BUN 11 06/06/2015   CO2 27 06/06/2015   TSH 0.50 06/06/2015   HGBA1C 5.6 06/06/2015    Lab Results  Component Value Date    TSH 0.50 06/06/2015   Lab Results  Component Value Date   WBC 9.3 06/06/2015   HGB 14.8 06/06/2015   HCT 44.6 06/06/2015   MCV 87.4 06/06/2015   PLT 278.0 06/06/2015   Lab Results  Component Value Date   NA 139 06/06/2015   K 3.9 06/06/2015   CO2 27 06/06/2015   GLUCOSE 124* 06/06/2015   BUN 11 06/06/2015   CREATININE 0.83 06/06/2015   BILITOT 0.4 06/06/2015   ALKPHOS 60 06/06/2015   AST 16 06/06/2015   ALT 17 06/06/2015   PROT 7.3 06/06/2015   ALBUMIN 4.1 06/06/2015   CALCIUM 9.7 06/06/2015   GFR 78.19 06/06/2015   Lab Results  Component Value Date   CHOL 237* 06/06/2015  Lab Results  Component Value Date   HDL 43.00 06/06/2015   Lab Results  Component Value Date   LDLCALC 158* 06/06/2015   Lab Results  Component Value Date   TRIG 179.0* 06/06/2015   Lab Results  Component Value Date   CHOLHDL 6 06/06/2015   Lab Results  Component Value Date   HGBA1C 5.6 06/06/2015       Assessment & Plan:   Problem List Items Addressed This Visit    H/O thyrotoxicosis   Hyperglycemia    hgba1c acceptable last year, minimize simple carbs. Increase exercise as tolerated. Recheck  hgba1c today      Relevant Orders   Lipid panel (Completed)   Vitamin B12 (Completed)   CBC (Completed)   Comprehensive metabolic panel (Completed)   TSH (Completed)   Hemoglobin A1c (Completed)   Hyperlipidemia    Encouraged increased hydration, 64 ounces of clear fluids daily. Minimize alcohol and caffeine. Eat small frequent meals with lean proteins and complex carbs. Avoid high and low blood sugars. Get adequate sleep, 7-8 hours a night. Needs exercise daily preferably in the morning. Check labs today      Relevant Orders   Lipid panel (Completed)   Vitamin B12 (Completed)   CBC (Completed)   Comprehensive metabolic panel (Completed)   TSH (Completed)   Obesity    Encouraged DASH diet, decrease po intake and increase exercise as tolerated. Needs 7-8 hours of sleep nightly.  Avoid trans fats, eat small, frequent meals every 4-5 hours with lean proteins, complex carbs and healthy fats. Minimize simple carbs, GMO foods.      Relevant Orders   Lipid panel (Completed)   Vitamin B12 (Completed)   CBC (Completed)   Comprehensive metabolic panel (Completed)   TSH (Completed)   Preventative health care    Patient encouraged to maintain heart healthy diet, regular exercise, adequate sleep. Consider daily probiotics. Take medications as prescribed      Relevant Orders   Lipid panel (Completed)   Vitamin B12 (Completed)   CBC (Completed)   Comprehensive metabolic panel (Completed)   TSH (Completed)   Sleep apnea    Wears her CPAP machine nightly, sleeping.      Vitamin B 12 deficiency    Check level today      Relevant Orders   Lipid panel (Completed)   Vitamin B12 (Completed)   CBC (Completed)   Comprehensive metabolic panel (Completed)   TSH (Completed)    Other Visit Diagnoses    Breast cancer screening        Relevant Orders    MM DIGITAL SCREENING BILATERAL       I am having Ms. Elsasser maintain her levonorgestrel, Omega-3 Fatty Acids (OMEGA 3 PO), cyanocobalamin, levothyroxine, and sertraline.  No orders of the defined types were placed in this encounter.     Danise Edge, MD

## 2015-06-06 NOTE — Assessment & Plan Note (Signed)
hgba1c acceptable last year, minimize simple carbs. Increase exercise as tolerated. Recheck  hgba1c today

## 2015-06-07 LAB — COMPREHENSIVE METABOLIC PANEL
ALBUMIN: 4.1 g/dL (ref 3.5–5.2)
ALK PHOS: 60 U/L (ref 39–117)
ALT: 17 U/L (ref 0–35)
AST: 16 U/L (ref 0–37)
BILIRUBIN TOTAL: 0.4 mg/dL (ref 0.2–1.2)
BUN: 11 mg/dL (ref 6–23)
CALCIUM: 9.7 mg/dL (ref 8.4–10.5)
CO2: 27 mEq/L (ref 19–32)
CREATININE: 0.83 mg/dL (ref 0.40–1.20)
Chloride: 105 mEq/L (ref 96–112)
GFR: 78.19 mL/min (ref >60.00)
Glucose, Bld: 124 mg/dL — ABNORMAL HIGH (ref 70–99)
Potassium: 3.9 mEq/L (ref 3.5–5.1)
Sodium: 139 mEq/L (ref 135–145)
Total Protein: 7.3 g/dL (ref 6.0–8.3)

## 2015-06-07 LAB — LIPID PANEL
CHOLESTEROL: 237 mg/dL — AB (ref 0–200)
HDL: 43 mg/dL (ref >39.00)
LDL Cholesterol: 158 mg/dL — ABNORMAL HIGH (ref 0–99)
NONHDL: 193.68
Total CHOL/HDL Ratio: 6
Triglycerides: 179 mg/dL — ABNORMAL HIGH (ref 0.0–149.0)
VLDL: 35.8 mg/dL (ref 0.0–40.0)

## 2015-06-07 LAB — CBC
HCT: 44.6 % (ref 36.0–46.0)
Hemoglobin: 14.8 g/dL (ref 12.0–15.0)
MCHC: 33.2 g/dL (ref 30.0–36.0)
MCV: 87.4 fl (ref 78.0–100.0)
PLATELETS: 278 10*3/uL (ref 150.0–400.0)
RBC: 5.11 Mil/uL (ref 3.87–5.11)
RDW: 12.8 % (ref 11.5–15.5)
WBC: 9.3 10*3/uL (ref 4.0–10.5)

## 2015-06-07 LAB — HEMOGLOBIN A1C: HEMOGLOBIN A1C: 5.6 % (ref 4.6–6.5)

## 2015-06-07 LAB — TSH: TSH: 0.5 u[IU]/mL (ref 0.35–4.50)

## 2015-06-07 LAB — VITAMIN B12: VITAMIN B 12: 336 pg/mL (ref 211–911)

## 2015-07-21 ENCOUNTER — Other Ambulatory Visit: Payer: Self-pay | Admitting: Family Medicine

## 2015-07-21 ENCOUNTER — Ambulatory Visit (HOSPITAL_BASED_OUTPATIENT_CLINIC_OR_DEPARTMENT_OTHER)
Admission: RE | Admit: 2015-07-21 | Discharge: 2015-07-21 | Disposition: A | Discharge status: Home / Self Care | Payer: 59 | Source: Ambulatory Visit | Attending: Family Medicine | Admitting: Family Medicine

## 2015-07-21 DIAGNOSIS — Z1231 Encounter for screening mammogram for malignant neoplasm of breast: Secondary | ICD-10-CM | POA: Diagnosis not present

## 2015-07-21 DIAGNOSIS — Z1239 Encounter for other screening for malignant neoplasm of breast: Secondary | ICD-10-CM | POA: Insufficient documentation

## 2015-07-29 ENCOUNTER — Other Ambulatory Visit: Payer: Self-pay | Admitting: Family Medicine

## 2015-08-14 ENCOUNTER — Ambulatory Visit (INDEPENDENT_AMBULATORY_CARE_PROVIDER_SITE_OTHER): Payer: 59 | Admitting: Medical

## 2015-08-14 ENCOUNTER — Encounter: Payer: Self-pay | Admitting: Medical

## 2015-08-14 ENCOUNTER — Encounter: Payer: Self-pay | Admitting: Family Medicine

## 2015-08-14 VITALS — BP 118/72 | HR 80 | Temp 98.3°F | Ht 65.0 in | Wt 262.2 lb

## 2015-08-14 DIAGNOSIS — R05 Cough: Secondary | ICD-10-CM

## 2015-08-14 DIAGNOSIS — B349 Viral infection, unspecified: Secondary | ICD-10-CM | POA: Diagnosis not present

## 2015-08-14 DIAGNOSIS — R059 Cough, unspecified: Secondary | ICD-10-CM

## 2015-08-14 DIAGNOSIS — M791 Myalgia, unspecified site: Secondary | ICD-10-CM

## 2015-08-14 DIAGNOSIS — R6883 Chills (without fever): Secondary | ICD-10-CM

## 2015-08-14 DIAGNOSIS — M2550 Pain in unspecified joint: Secondary | ICD-10-CM

## 2015-08-14 LAB — POC INFLUENZA A&B (BINAX/QUICKVUE)
Influenza A, POC: NEGATIVE
Influenza B, POC: NEGATIVE

## 2015-08-14 MED ORDER — BENZONATATE 100 MG PO CAPS: 100.0000 mg | ORAL_CAPSULE | Freq: Three times a day (TID) | ORAL | Status: DC | PRN

## 2015-08-14 MED ORDER — OSELTAMIVIR PHOSPHATE 75 MG PO CAPS: 75.0000 mg | ORAL_CAPSULE | Freq: Two times a day (BID) | ORAL | Status: DC

## 2015-08-14 NOTE — Progress Notes (Signed)
Pre visit review using our clinic review tool, if applicable. No additional management support is needed unless otherwise documented below in the visit note. 

## 2015-08-14 NOTE — Progress Notes (Signed)
Subjective:    Patient ID: Tiffany Morris, female    DOB: Dec 18, 1965, 50 y.o.   MRN: [id number]  HPI  Pt in with cough since yesterday. Low grade fever last night. Mild achy joints. Hips and back yesterday afternoon. . Chills today. Moderate fatigue. 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   Pt is on mirena.  Nasal congested as well.    Review of Systems  Constitutional: Positive for fever and fatigue. Negative for chills.  HENT: Positive for congestion. Negative for drooling.   Respiratory: Positive for cough. Negative for shortness of breath and wheezing.   Cardiovascular: Negative for chest pain and palpitations.  Gastrointestinal: Negative for abdominal pain.  Musculoskeletal: Positive for myalgias and arthralgias. Negative for back pain.  Neurological: Negative for dizziness and headaches.  Hematological: Negative for adenopathy. Does not bruise/bleed easily.  Psychiatric/Behavioral: Negative for behavioral problems and confusion.    Past Medical History  Diagnosis Date  . Chicken pox as a child  . Thyroid disease     hyper  . Vitamin B 12 deficiency   . Obesity   . Depression with anxiety 08/05/2011  . hypothyroidism 06/02/2010  . Pain of left breast 08/05/2011  . Pedal edema 08/05/2011  . Hyperlipidemia 09/05/2011  . Unspecified sleep apnea 08/20/2013  . Hyperglycemia 06/05/2014  . H/O thyrotoxicosis 06/02/2010    Qualifier: Diagnosis of  By: Cathren Harsh MD, Jeannett Senior  H/o of hyper secondary to thyrotoxicosis     Social History   Social History  . Marital Status: Married    Spouse Name: N/A  . Number of Children: N/A  . Years of Education: N/A   Occupational History  . NP [employer]   Social History Main Topics  . Smoking status: Never Smoker   . Smokeless tobacco: Current User  . Alcohol Use: No  . Drug Use: No  . Sexual Activity: Not Currently     Comment: lives with husband, son age 54, no  dietary restrictions.. Nurse Practiitoner with [place]   Other Topics Concern  . Not on file   Social History Narrative    Past Surgical History  Procedure Laterality Date  . Cesarean section  2004  . Retinal detachment repair w/ scleral buckle le  01-25-10  . Cholecystectomy      laparoscopic  . Colonoscopy  2014    Family History  Problem Relation Age of Onset  . Hyperlipidemia Mother   . Diabetes Mother     borderline  . Hyperlipidemia Father   . Hypertension Father   . Diabetes Father     type 2  . Hyperthyroidism Father   . Obesity Father   . Cancer Maternal Grandmother     lung- smoker  . COPD Maternal Grandfather     smoked  . Cancer Maternal Grandfather     bladder  . Heart disease Paternal Grandmother     CHF, MI  . Hypothyroidism Paternal Grandmother   . Hypertension Paternal Grandmother   . Hypertension Paternal Grandfather   . Heart disease Paternal Grandfather     open heart surgery  . Hyperlipidemia Paternal Grandfather   . Macular degeneration Paternal Grandfather     No Known Allergies  Current Outpatient Prescriptions on File Prior to Visit  Medication Sig Dispense Refill  . cyanocobalamin (,VITAMIN B-12,) 1000 MCG/ML injection Inject 1 mL (1,000 mcg total) into the muscle once. 10 mL 5  . levonorgestrel (  MIRENA) 20 MCG/24HR IUD 1 each by Intrauterine route once.    Marland Kitchen. levothyroxine (SYNTHROID, LEVOTHROID) 150 MCG tablet Take 1 tablet (150 mcg  total) by mouth daily  before breakfast. 90 tablet 4  . Omega-3 Fatty Acids (OMEGA 3 PO) Take by mouth daily.     No current facility-administered medications on file prior to visit.    BP 118/72 mmHg  Pulse 80  Temp(Src) 98.3 F (36.8 C) (Oral)  Ht 5\' 5"  (1.651 m)  Wt 262 lb 3.2 oz (118.933 kg)  BMI 43.63 kg/m2  SpO2 98%       Objective:   Physical Exam   General  Mental Status - Alert. General Appearance - Well groomed. Not in acute distress.  Skin Rashes- No Rashes.  HEENT Head-  Normal. Ear Auditory Canal - Left- Normal. Right - Normal.Tympanic Membrane- Left- Normal. Right- Normal. Eye Sclera/Conjunctiva- Left- Normal. Right- Normal. Nose & Sinuses Nasal Mucosa- Left-  Boggy and Congested. Right-  Boggy and  Congested.Bilateral  No maxillary and no frontal sinus pressure. Mouth & Throat Lips: Upper Lip- Normal: no dryness, cracking, pallor, cyanosis, or vesicular eruption. Lower Lip-Normal: no dryness, cracking, pallor, cyanosis or vesicular eruption. Buccal Mucosa- Bilateral- No Aphthous ulcers. Oropharynx- No Discharge or Erythema. Tonsils: Characteristics- Bilateral- No Erythema or Congestion. Size/Enlargement- Bilateral- No enlargement. Discharge- bilateral-None.  Neck Neck- Supple. No Masses.   Chest and Lung Exam Auscultation: Breath Sounds:-Clear even and unlabored.  Cardiovascular Auscultation:Rythm- Regular, rate and rhythm. Murmurs & Other Heart Sounds:Ausculatation of the heart reveal- No Murmurs.  Lymphatic Head & Neck General Head & Neck Lymphatics: Bilateral: Description- No Localized lymphadenopathy.        Assessment & Plan:  You have early moderate flu like syndrome and direct pt care at your work.. I think best to rx tamiflu  within treatment time frame window.   Rx of benzonatate for cough.  If you develop secondary bacterial infection symptom over next 5 days before weekend please let us know and would rx antibiotic.  Follow up in 7 days or as needed

## 2015-08-14 NOTE — Patient Instructions (Addendum)
You have early moderate flu like syndrome direct pt care at your work. I think best to rx tamiflu  within treatment time frame window.   Rx of benzonatate for cough.  If you develop secondary bacterial infection symptom over next 5 days before weekend please let us know and would rx antibiotic.  Follow up in 7 days or as needed

## 2015-08-15 ENCOUNTER — Other Ambulatory Visit: Payer: Self-pay | Admitting: Family Medicine

## 2015-08-15 MED ORDER — NALTREXONE-BUPROPION HCL ER 8-90 MG PO TB12: ORAL_TABLET | ORAL | Status: DC

## 2015-08-17 ENCOUNTER — Telehealth: Payer: Self-pay | Admitting: Family Medicine

## 2015-08-17 NOTE — Telephone Encounter (Signed)
I wanted her to call back if she was having worse symptoms such as bronchitis. If so I could send in azithromycin 250 mg #6 2 tab po day 1 then 1 tab po x 4 days. If cough persist can rx hycodan and she could pick up that rx(can't fax or call in). Stop benzonatate if she starts hycodan.

## 2015-08-17 NOTE — Telephone Encounter (Signed)
Please advise 

## 2015-08-17 NOTE — Telephone Encounter (Signed)
Caller name:Self  Can be reached: [phone]   Reason for call: Patient called stating that she is still coughing. Saw Edward on Monday and was informed to call back if she did not feel better

## 2015-08-18 NOTE — Telephone Encounter (Signed)
Spoke with pt and she states that she is feeling some better, and she does not feel that she will need to have an antibiotic or cough syrup. She will call back next week if she feels any worse. She states that she stayed out of work this week and will go back next week.

## 2015-09-21 ENCOUNTER — Telehealth: Payer: Self-pay | Admitting: *Deleted

## 2015-09-21 NOTE — Telephone Encounter (Signed)
Filled out as much as possible and forwarded to Dr. Blyth. JG//CMA 

## 2015-09-27 NOTE — Telephone Encounter (Signed)
Signed form faxed successfully to [facility] at [phone]. Copy sent for scanning. JG//CMA

## 2015-10-06 ENCOUNTER — Telehealth: Payer: Self-pay | Admitting: Family Medicine

## 2015-10-06 MED ORDER — SERTRALINE HCL 50 MG PO TABS: ORAL_TABLET | ORAL | Status: DC

## 2015-10-06 NOTE — Telephone Encounter (Signed)
Can be reached: [phone] (work #) Pharmacy: [id number] - [place], [place] - [address]  Reason for call: Pt states that Contrave made her nauseous and she does not want to take it. She would like to be started back on the zoloft and knows she may need to be ramped up back to 100mg . Please send in for her or call.

## 2015-10-06 NOTE — Telephone Encounter (Signed)
Updated medication list and sent in zoloft as PCP instructed. Patient informed of medication request granted and sent to the pharmacy.

## 2015-10-06 NOTE — Telephone Encounter (Signed)
OK to start zoloft 50 mg tabs 1/2 tab po daily x 7 days then. Then increase to 1 tab po daily x 1 month then increase to 2 tabs po daily and come in for visit in 6 to 8 weeks. Disp #60 and 2 rf

## 2015-11-06 ENCOUNTER — Encounter: Payer: Self-pay | Admitting: Medical

## 2015-11-06 ENCOUNTER — Ambulatory Visit (INDEPENDENT_AMBULATORY_CARE_PROVIDER_SITE_OTHER): Payer: 59 | Admitting: Medical

## 2015-11-06 VITALS — BP 116/76 | HR 77 | Temp 97.7°F | Ht 65.0 in | Wt 266.8 lb

## 2015-11-06 DIAGNOSIS — H6121 Impacted cerumen, right ear: Secondary | ICD-10-CM

## 2015-11-06 NOTE — Progress Notes (Signed)
Pre visit review using our clinic review tool, if applicable. No additional management support is needed unless otherwise documented below in the visit note. 

## 2015-11-06 NOTE — Patient Instructions (Signed)
We cleared the wax out completely today. If you have any recurrent pressure or irritation then could use you flonase to cover for eustachian tube dysfunction. If recurrent feeling in future maybe use debrox again before coming in to soften the was.  Follow up as regularly scheduled with pcp or as needed

## 2015-11-06 NOTE — Progress Notes (Signed)
Subjective:    Patient ID: Tiffany Morris, female    DOB: Jan 14, 1966, 50 y.o.   MRN: [id number]  HPI   Pt in with rt ear pressure for one week. Pt state earlier today she had brief relief. Pt has tried to blow her nose to change pressure. Then tried sudafed and zyrtec. Pt has no sneezing, itching eyes or runny nose. Pt states went to beach last week. She is not sure if this caused her symptoms.   Review of Systems  Constitutional: Negative for fever, chills and fatigue.  HENT: Positive for ear pain.   Respiratory: Negative for cough, choking, shortness of breath and wheezing.   Cardiovascular: Negative for chest pain and palpitations.  Gastrointestinal: Negative for abdominal pain, constipation and anal bleeding.  Musculoskeletal: Negative for back pain and gait problem.  Skin: Negative for rash.  Neurological: Negative for dizziness and facial asymmetry.  Hematological: Negative for adenopathy. Does not bruise/bleed easily.  Psychiatric/Behavioral: Negative for behavioral problems and confusion.     Past Medical History  Diagnosis Date  . Chicken pox as a child  . Thyroid disease     hyper  . Vitamin B 12 deficiency   . Obesity   . Depression with anxiety 08/05/2011  . hypothyroidism 06/02/2010  . Pain of left breast 08/05/2011  . Pedal edema 08/05/2011  . Hyperlipidemia 09/05/2011  . Unspecified sleep apnea 08/20/2013  . Hyperglycemia 06/05/2014  . 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 Marital Status: Married    Spouse Name: N/A  . Number of Children: N/A  . Years of Education: N/A   Occupational History  . NP [employer]   Social History Main Topics  . Smoking status: Never Smoker   . Smokeless tobacco: Current User  . Alcohol Use: No  . Drug Use: No  . Sexual Activity: Not Currently     Comment: lives with husband, son age 12, no  dietary restrictions.. Nurse Practiitoner with [place]   Other Topics Concern  . Not on file   Social History Narrative    Past Surgical History  Procedure Laterality Date  . Cesarean section  2004  . Retinal detachment repair w/ scleral buckle le  01-25-10  . Cholecystectomy      laparoscopic  . Colonoscopy  2014    Family History  Problem Relation Age of Onset  . Hyperlipidemia Mother   . Diabetes Mother     borderline  . Hyperlipidemia Father   . Hypertension Father   . Diabetes Father     type 2  . Hyperthyroidism Father   . Obesity Father   . Cancer Maternal Grandmother     lung- smoker  . COPD Maternal Grandfather     smoked  . Cancer Maternal Grandfather     bladder  . Heart disease Paternal Grandmother     CHF, MI  . Hypothyroidism Paternal Grandmother   . Hypertension Paternal Grandmother   . Hypertension Paternal Grandfather   . Heart disease Paternal Grandfather     open heart surgery  . Hyperlipidemia Paternal Grandfather   . Macular degeneration Paternal Grandfather     No Known Allergies  Current Outpatient Prescriptions on File Prior to Visit  Medication Sig Dispense Refill  . cyanocobalamin (,VITAMIN B-12,) 1000 MCG/ML injection Inject 1 mL (1,000 mcg total) into the muscle  once. 10 mL 5  . levonorgestrel (MIRENA) 20 MCG/24HR IUD 1 each by Intrauterine route once.    Marland Kitchen. levothyroxine (SYNTHROID, LEVOTHROID) 150 MCG tablet Take 1 tablet (150 mcg  total) by mouth daily  before breakfast. 90 tablet 4  . Omega-3 Fatty Acids (OMEGA 3 PO) Take by mouth daily.    . sertraline (ZOLOFT) 50 MG tablet Take 1/2 by mouth for 7 days, then 1 tablet for one month, then increase to 2 tablets. 60 tablet 2   No current facility-administered medications on file prior to visit.    BP 116/76 mmHg  Pulse 77  Temp(Src) 97.7 F (36.5 C) (Oral)  Ht 5\' 5"  (1.651 m)  Wt 266 lb 12.8 oz (121.02 kg)  BMI 44.40 kg/m2  SpO2 98%       Objective:   Physical  Exam \\General   Mental Status - Alert. General Appearance - Well groomed. Not in acute distress.  Skin Rashes- No Rashes.  HEENT Head- Normal. Ear Auditory Canal - Left- Normal. Right - Normal.Tympanic Membrane- Left- Normal. Right- cerumen impaction. Looks like this layer of Eye Sclera/Conjunctiva- Left- Normal. Right- Normal. Nose & Sinuses Nasal Mucosa- Left- Not  Boggy or  Congested. Right-  Boggy or Congested.Bilateral no maxillary and no frontal sinus pressure. Mouth & Throat Lips: Upper Lip- Normal: no dryness, cracking, pallor, cyanosis, or vesicular eruption. Lower Lip-Normal: no dryness, cracking, pallor, cyanosis or vesicular eruption. Buccal Mucosa- Bilateral- No Aphthous ulcers. Oropharynx- No Discharge or Erythema. Tonsils: Characteristics- Bilateral- No Erythema or Congestion. Size/Enlargement- Bilateral- No enlargement. Discharge- bilateral-None.  Neck Neck- Supple. No Masses.   Chest and Lung Exam Auscultation: Breath Sounds:-Clear even and unlabored.  Cardiovascular Auscultation:Rythm- Regular, rate and rhythm. Murmurs & Other Heart Sounds:Ausculatation of the heart reveal- No Murmurs.  Lymphatic Head & Neck General Head & Neck Lymphatics: Bilateral: Description- No Localized lymphadenopathy.        Assessment & Plan:  We cleared the wax out completely today. If you have any recurrent pressure or irritation then could use you flonase to cover for eustachian tube dysfunction. If recurrent feeling in future maybe use debrox again before coming in to soften the was.  Follow up as regularly scheduled with pcp or as needed

## 2015-11-15 ENCOUNTER — Other Ambulatory Visit: Payer: Self-pay | Admitting: Family Medicine

## 2015-11-27 ENCOUNTER — Other Ambulatory Visit: Payer: Self-pay | Admitting: Family Medicine

## 2015-11-27 ENCOUNTER — Encounter: Payer: Self-pay | Admitting: Family Medicine

## 2015-11-27 MED ORDER — CYANOCOBALAMIN 1000 MCG/ML IJ SOLN: 1000.0000 ug | Freq: Once | INTRAMUSCULAR | Status: DC

## 2015-12-04 ENCOUNTER — Encounter: Payer: Self-pay | Admitting: Family Medicine

## 2015-12-04 ENCOUNTER — Ambulatory Visit (INDEPENDENT_AMBULATORY_CARE_PROVIDER_SITE_OTHER): Payer: 59 | Admitting: Family Medicine

## 2015-12-04 VITALS — BP 108/68 | HR 104 | Temp 98.4°F | Ht 65.0 in | Wt 269.0 lb

## 2015-12-04 DIAGNOSIS — E782 Mixed hyperlipidemia: Secondary | ICD-10-CM | POA: Diagnosis not present

## 2015-12-04 DIAGNOSIS — E785 Hyperlipidemia, unspecified: Secondary | ICD-10-CM

## 2015-12-04 DIAGNOSIS — G4733 Obstructive sleep apnea (adult) (pediatric): Secondary | ICD-10-CM | POA: Diagnosis not present

## 2015-12-04 DIAGNOSIS — F418 Other specified anxiety disorders: Secondary | ICD-10-CM

## 2015-12-04 DIAGNOSIS — R739 Hyperglycemia, unspecified: Secondary | ICD-10-CM

## 2015-12-04 DIAGNOSIS — E669 Obesity, unspecified: Secondary | ICD-10-CM

## 2015-12-04 DIAGNOSIS — Z8639 Personal history of other endocrine, nutritional and metabolic disease: Secondary | ICD-10-CM

## 2015-12-04 MED ORDER — SERTRALINE HCL 100 MG PO TABS: 100.0000 mg | ORAL_TABLET | Freq: Every day | ORAL | Status: DC

## 2015-12-04 NOTE — Patient Instructions (Addendum)
Cooking Light Diet   DASH Eating Plan DASH stands for "Dietary Approaches to Stop Hypertension." The DASH eating plan is a healthy eating plan that has been shown to reduce high blood pressure (hypertension). Additional health benefits may include reducing the risk of type 2 diabetes mellitus, heart disease, and stroke. The DASH eating plan may also help with weight loss. WHAT DO I NEED TO KNOW ABOUT THE DASH EATING PLAN? For the DASH eating plan, you will follow these general guidelines:  Choose foods with a percent daily value for sodium of less than 5% (as listed on the food label).  Use salt-free seasonings or herbs instead of table salt or sea salt.  Check with your health care provider or pharmacist before using salt substitutes.  Eat lower-sodium products, often labeled as "lower sodium" or "no salt added."  Eat fresh foods.  Eat more vegetables, fruits, and low-fat dairy products.  Choose whole grains. Look for the word "whole" as the first word in the ingredient list.  Choose fish and skinless chicken or [place] more often than red meat. Limit fish, poultry, and meat to 6 oz (170 g) each day.  Limit sweets, desserts, sugars, and sugary drinks.  Choose heart-healthy fats.  Limit cheese to 1 oz (28 g) per day.  Eat more home-cooked food and less restaurant, buffet, and fast food.  Limit fried foods.  Cook foods using methods other than frying.  Limit canned vegetables. If you do use them, rinse them well to decrease the sodium.  When eating at a restaurant, ask that your food be prepared with less salt, or no salt if possible. WHAT FOODS CAN I EAT? Seek help from a dietitian for individual calorie needs. Grains Whole grain or whole wheat bread. Brown rice. Whole grain or whole wheat pasta. Quinoa, bulgur, and whole grain cereals. Low-sodium cereals. Corn or whole wheat flour tortillas. Whole grain cornbread. Whole grain crackers. Low-sodium crackers. Vegetables Fresh  or frozen vegetables (raw, steamed, roasted, or grilled). Low-sodium or reduced-sodium tomato and vegetable juices. Low-sodium or reduced-sodium tomato sauce and paste. Low-sodium or reduced-sodium canned vegetables.  Fruits All fresh, canned (in natural juice), or frozen fruits. Meat and Other Protein Products Ground beef (85% or leaner), grass-fed beef, or beef trimmed of fat. Skinless chicken or [place]. Ground chicken or [place]. Pork trimmed of fat. All fish and seafood. Eggs. Dried beans, peas, or lentils. Unsalted nuts and seeds. Unsalted canned beans. Dairy Low-fat dairy products, such as skim or 1% milk, 2% or reduced-fat cheeses, low-fat ricotta or cottage cheese, or plain low-fat yogurt. Low-sodium or reduced-sodium cheeses. Fats and Oils Tub margarines without trans fats. Light or reduced-fat mayonnaise and salad dressings (reduced sodium). Avocado. Safflower, olive, or canola oils. Natural peanut or almond butter. Other Unsalted popcorn and pretzels. The items listed above may not be a complete list of recommended foods or beverages. Contact your dietitian for more options. WHAT FOODS ARE NOT RECOMMENDED? Grains White bread. White pasta. White rice. Refined cornbread. Bagels and croissants. Crackers that contain trans fat. Vegetables Creamed or fried vegetables. Vegetables in a cheese sauce. Regular canned vegetables. Regular canned tomato sauce and paste. Regular tomato and vegetable juices. Fruits Dried fruits. Canned fruit in light or heavy syrup. Fruit juice. Meat and Other Protein Products Fatty cuts of meat. Ribs, chicken wings, bacon, sausage, bologna, salami, chitterlings, fatback, hot dogs, bratwurst, and packaged luncheon meats. Salted nuts and seeds. Canned beans with salt. Dairy Whole or 2% milk, cream, half-and-half, and cream cheese.  Whole-fat or sweetened yogurt. Full-fat cheeses or blue cheese. Nondairy creamers and whipped toppings. Processed cheese, cheese spreads,  or cheese curds. Condiments Onion and garlic salt, seasoned salt, table salt, and sea salt. Canned and packaged gravies. Worcestershire sauce. Tartar sauce. Barbecue sauce. Teriyaki sauce. Soy sauce, including reduced sodium. Steak sauce. Fish sauce. Oyster sauce. Cocktail sauce. Horseradish. Ketchup and mustard. Meat flavorings and tenderizers. Bouillon cubes. Hot sauce. Tabasco sauce. Marinades. Taco seasonings. Relishes. Fats and Oils Butter, stick margarine, lard, shortening, ghee, and bacon fat. Coconut, palm kernel, or palm oils. Regular salad dressings. Other Pickles and olives. Salted popcorn and pretzels. The items listed above may not be a complete list of foods and beverages to avoid. Contact your dietitian for more information. WHERE CAN I FIND MORE INFORMATION? National Heart, Lung, and Blood Institute: [employer]   This information is not intended to replace advice given to you by your health care provider. Make sure you discuss any questions you have with your health care provider.   Document Released: 05/02/2011 Document Revised: 06/03/2014 Document Reviewed: 03/17/2013 Elsevier Interactive Patient Education 2016 Elsevier Inc. Cholesterol Cholesterol is a white, waxy, fat-like substance needed by your body in small amounts. The liver makes all the cholesterol you need. Cholesterol is carried from the liver by the blood through the blood vessels. Deposits of cholesterol (plaque) may build up on blood vessel walls. These make the arteries narrower and stiffer. Cholesterol plaques increase the risk for heart attack and stroke.  You cannot feel your cholesterol level even if it is very high. The only way to know it is high is with a blood test. Once you know your cholesterol levels, you should keep a record of the test results. Work with your health care provider to keep your levels in the desired range.  WHAT DO THE RESULTS MEAN?  Total  cholesterol is a rough measure of all the cholesterol in your blood.   LDL is the so-called bad cholesterol. This is the type that deposits cholesterol in the walls of the arteries. You want this level to be low.   HDL is the good cholesterol because it cleans the arteries and carries the LDL away. You want this level to be high.  Triglycerides are fat that the body can either burn for energy or store. High levels are closely linked to heart disease.  WHAT ARE THE DESIRED LEVELS OF CHOLESTEROL?  Total cholesterol below 200.   LDL below 100 for people at risk, below 70 for those at very high risk.   HDL above 50 is good, above 60 is best.   Triglycerides below 150.  HOW CAN I LOWER MY CHOLESTEROL?  Diet. Follow your diet programs as directed by your health care provider.   Choose fish or white meat chicken and [place], roasted or baked. Limit fatty cuts of red meat, fried foods, and processed meats, such as sausage and lunch meats.   Eat lots of fresh fruits and vegetables.  Choose whole grains, beans, pasta, potatoes, and cereals.   Use only small amounts of olive, corn, or canola oils.   Avoid butter, mayonnaise, shortening, or palm kernel oils.  Avoid foods with trans fats.   Drink skim or nonfat milk and eat low-fat or nonfat yogurt and cheeses. Avoid whole milk, cream, ice cream, egg yolks, and full-fat cheeses.   Healthy desserts include angel food cake, ginger snaps, animal crackers, hard candy, popsicles, and low-fat or nonfat frozen yogurt. Avoid pastries, cakes, pies, and cookies.  Exercise. Follow your exercise programs as directed by your health care provider.   A regular program helps decrease LDL and raise HDL.   A regular program helps with weight control.   Do things that increase your activity level like gardening, walking, or taking the stairs. Ask your health care provider about how you can be more active in your daily life.   Medicine.  Take medicine only as directed by your health care provider.   Medicine may be prescribed by your health care provider to help lower cholesterol and decrease the risk for heart disease.   If you have several risk factors, you may need medicine even if your levels are normal.   This information is not intended to replace advice given to you by your health care provider. Make sure you discuss any questions you have with your health care provider.   Document Released: 02/05/2001 Document Revised: 06/03/2014 Document Reviewed: 02/24/2013 Elsevier Interactive Patient Education [employer]

## 2015-12-04 NOTE — Assessment & Plan Note (Signed)
Encouraged DASH diet, decrease po intake and increase exercise as tolerated. Needs 7-8 hours of sleep nightly. Avoid trans fats, eat small, frequent meals every 4-5 hours with lean proteins, complex carbs and healthy fats. Minimize simple carbs 

## 2015-12-04 NOTE — Progress Notes (Signed)
Pre visit review using our clinic review tool, if applicable. No additional management support is needed unless otherwise documented below in the visit note. 

## 2015-12-04 NOTE — Assessment & Plan Note (Signed)
Uses CPAP nightly, has noted and recorded increased episodes of snoring again lately.

## 2015-12-05 LAB — COMPREHENSIVE METABOLIC PANEL
ALBUMIN: 4 g/dL (ref 3.5–5.2)
ALK PHOS: 68 U/L (ref 39–117)
ALT: 18 U/L (ref 0–35)
AST: 16 U/L (ref 0–37)
BUN: 12 mg/dL (ref 6–23)
CALCIUM: 9.2 mg/dL (ref 8.4–10.5)
CO2: 28 mEq/L (ref 19–32)
CREATININE: 0.84 mg/dL (ref 0.40–1.20)
Chloride: 105 mEq/L (ref 96–112)
GFR: 76.95 mL/min (ref >60.00)
Glucose, Bld: 147 mg/dL — ABNORMAL HIGH (ref 70–99)
Potassium: 3.6 mEq/L (ref 3.5–5.1)
SODIUM: 139 meq/L (ref 135–145)
TOTAL PROTEIN: 7.5 g/dL (ref 6.0–8.3)
Total Bilirubin: 0.4 mg/dL (ref 0.2–1.2)

## 2015-12-05 LAB — LIPID PANEL
CHOLESTEROL: 223 mg/dL — AB (ref 0–200)
HDL: 42.9 mg/dL (ref >39.00)
LDL Cholesterol: 141 mg/dL — ABNORMAL HIGH (ref 0–99)
NonHDL: 179.72
TRIGLYCERIDES: 196 mg/dL — AB (ref 0.0–149.0)
Total CHOL/HDL Ratio: 5
VLDL: 39.2 mg/dL (ref 0.0–40.0)

## 2015-12-05 LAB — CBC
HCT: 42.7 % (ref 36.0–46.0)
HEMOGLOBIN: 14.3 g/dL (ref 12.0–15.0)
MCHC: 33.6 g/dL (ref 30.0–36.0)
MCV: 85.6 fl (ref 78.0–100.0)
PLATELETS: 272 10*3/uL (ref 150.0–400.0)
RBC: 4.98 Mil/uL (ref 3.87–5.11)
RDW: 12.9 % (ref 11.5–15.5)
WBC: 10.2 10*3/uL (ref 4.0–10.5)

## 2015-12-05 LAB — TSH: TSH: 0.54 u[IU]/mL (ref 0.35–4.50)

## 2015-12-05 LAB — HEMOGLOBIN A1C: HEMOGLOBIN A1C: 5.6 % (ref 4.6–6.5)

## 2015-12-17 NOTE — Assessment & Plan Note (Signed)
Encouraged heart healthy diet, increase exercise, avoid trans fats, consider a krill oil cap daily 

## 2015-12-17 NOTE — Progress Notes (Signed)
Patient ID: Tiffany Morris, female   DOB: 1965-07-06, 50 y.o.   MRN: [id number]     Subjective:    Patient ID: Tiffany Morris, female    DOB: [birth year], 50 y.o.   MRN: [id number]  Chief Complaint  Patient presents with  . Follow-up    HPI Patient is in today for follow up. She is doing well most days but is still struggling with increased stress and would like to increase her sertraline to 100 mg. No suicidal ideation but notes anhedonia. Denies CP/palp/SOB/HA/congestion/fevers/GI or GU c/o. Taking meds as prescribed  Past Medical History:  Diagnosis Date  . Chicken pox as a child  . Depression with anxiety 08/05/2011  . 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 Hyperglycemia 06/05/2014  . Hyperlipidemia 09/05/2011  . hypothyroidism 06/02/2010  . Obesity   . Pain of left breast 08/05/2011  . Pedal edema 08/05/2011  . Thyroid disease    hyper  . Unspecified sleep apnea 08/20/2013  . Vitamin B 12 deficiency     Past Surgical History:  Procedure Laterality Date  . CESAREAN SECTION  2004  . CHOLECYSTECTOMY     laparoscopic  . COLONOSCOPY  2014  . RETINAL DETACHMENT REPAIR W/ SCLERAL BUCKLE LE  01-25-10    Family History  Problem Relation Age of Onset  . Hyperlipidemia Mother   . Diabetes Mother     borderline  . Hyperlipidemia Father   . Hypertension Father   . Diabetes Father     type 2  . Hyperthyroidism Father   . Obesity Father   . Cancer Maternal Grandmother     lung- smoker  . COPD Maternal Grandfather     smoked  . Cancer Maternal Grandfather     bladder  . Heart disease Paternal Grandmother     CHF, MI  . Hypothyroidism Paternal Grandmother   . Hypertension Paternal Grandmother   . Hypertension Paternal Grandfather   . Heart disease Paternal Grandfather     open heart surgery  . Hyperlipidemia Paternal Grandfather   . Macular degeneration Paternal Grandfather      Social History   Social History  . Marital status: Married    Spouse name: N/A  . Number of children: N/A  . Years of education: N/A   Occupational History  . NP [employer]   Social History Main Topics  . Smoking status: Never Smoker  . Smokeless tobacco: Current User  . Alcohol use No  . Drug use: No  . Sexual activity: Not Currently     Comment: lives with husband, son age 85, no dietary restrictions.. Nurse Practiitoner with [place]   Other Topics Concern  . Not on file   Social History Narrative  . No narrative on file    Outpatient Medications Prior to Visit  Medication Sig Dispense Refill  . cyanocobalamin (,VITAMIN B-12,) 1000 MCG/ML injection Inject 1 mL (1,000 mcg total) into the muscle once. 10 mL 0  . levonorgestrel (MIRENA) 20 MCG/24HR IUD 1 each by Intrauterine route once.    Marland Kitchen levothyroxine (SYNTHROID, LEVOTHROID) 150 MCG tablet Take 1 tablet (150 mcg  total) by mouth daily  before breakfast. 90 tablet 4  . Omega-3 Fatty Acids (OMEGA 3 PO) Take by mouth daily.    . sertraline (ZOLOFT) 50 MG tablet Take 1/2 by mouth for 7 days, then 1 tablet for one month, then increase to 2  tablets. 60 tablet 2  . levothyroxine (SYNTHROID, LEVOTHROID) 150 MCG tablet TAKE ONE TABLET BY MOUTH ONCE DAILY BEFORE BREAKFAST 90 tablet 0   No facility-administered medications prior to visit.     No Known Allergies  Review of Systems  Constitutional: Negative for fever and malaise/fatigue.  HENT: Negative for congestion.   Eyes: Negative for blurred vision.  Respiratory: Negative for shortness of breath.   Cardiovascular: Negative for chest pain, palpitations and leg swelling.  Gastrointestinal: Negative for abdominal pain, blood in stool and nausea.  Genitourinary: Negative for dysuria and frequency.  Musculoskeletal: Negative for falls.  Skin: Negative for rash.  Neurological: Negative for dizziness, loss of consciousness and headaches.  Endo/Heme/Allergies:  Negative for environmental allergies.  Psychiatric/Behavioral: Positive for depression. The patient is nervous/anxious.        Objective:    Physical Exam  Constitutional: She is oriented to person, place, and time. She appears well-developed and well-nourished. No distress.  HENT:  Head: Normocephalic and atraumatic.  Nose: Nose normal.  Eyes: Right eye exhibits no discharge. Left eye exhibits no discharge.  Neck: Normal range of motion. Neck supple.  Cardiovascular: Normal rate and regular rhythm.   No murmur heard. Pulmonary/Chest: Effort normal and breath sounds normal.  Abdominal: Soft. Bowel sounds are normal. There is no tenderness.  Musculoskeletal: She exhibits no edema.  Neurological: She is alert and oriented to person, place, and time.  Skin: Skin is warm and dry.  Psychiatric: She has a normal mood and affect.  Nursing note and vitals reviewed.   BP 108/68   Pulse (!) 104   Temp 98.4 F (36.9 C) (Oral)   Ht  (1.651 m)   Wt 269 lb (122 kg)   SpO2 97%   BMI 44.76 kg/m  Wt Readings from Last 3 Encounters:  12/04/15 269 lb (122 kg)  11/06/15 266 lb 12.8 oz (121 kg)  08/14/15 262 lb 3.2 oz (118.9 kg)     Lab Results  Component Value Date   WBC 10.2 12/04/2015   HGB 14.3 12/04/2015   HCT 42.7 12/04/2015   PLT 272.0 12/04/2015   GLUCOSE 147 (H) 12/04/2015   CHOL 223 (H) 12/04/2015   TRIG 196.0 (H) 12/04/2015   HDL 42.90 12/04/2015   LDLDIRECT 168.3 06/02/2014   LDLCALC 141 (H) 12/04/2015   ALT 18 12/04/2015   AST 16 12/04/2015   NA 139 12/04/2015   K 3.6 12/04/2015   CL 105 12/04/2015   CREATININE 0.84 12/04/2015   BUN 12 12/04/2015   CO2 28 12/04/2015   TSH 0.54 12/04/2015   HGBA1C 5.6 12/04/2015    Lab Results  Component Value Date   TSH 0.54 12/04/2015   Lab Results  Component Value Date   WBC 10.2 12/04/2015   HGB 14.3 12/04/2015   HCT 42.7 12/04/2015   MCV 85.6 12/04/2015   PLT 272.0 12/04/2015   Lab Results  Component  Value Date   NA 139 12/04/2015   K 3.6 12/04/2015   CO2 28 12/04/2015   GLUCOSE 147 (H) 12/04/2015   BUN 12 12/04/2015   CREATININE 0.84 12/04/2015   BILITOT 0.4 12/04/2015   ALKPHOS 68 12/04/2015   AST 16 12/04/2015   ALT 18 12/04/2015   PROT 7.5 12/04/2015   ALBUMIN 4.0 12/04/2015   CALCIUM 9.2 12/04/2015   GFR 76.95 12/04/2015   Lab Results  Component Value Date   CHOL 223 (H) 12/04/2015   Lab Results  Component Value Date   HDL  42.90 12/04/2015   Lab Results  Component Value Date   LDLCALC 141 (H) 12/04/2015   Lab Results  Component Value Date   TRIG 196.0 (H) 12/04/2015   Lab Results  Component Value Date   CHOLHDL 5 12/04/2015   Lab Results  Component Value Date   HGBA1C 5.6 12/04/2015       Assessment & Plan:   Problem List Items Addressed This Visit    H/O thyrotoxicosis    On Levothyroxine, continue to monitor      Obesity    Encouraged DASH diet, decrease po intake and increase exercise as tolerated. Needs 7-8 hours of sleep nightly. Avoid trans fats, eat small, frequent meals every 4-5 hours with lean proteins, complex carbs and healthy fats. Minimize simple carbs      Relevant Orders   CBC (Completed)   TSH (Completed)   Comprehensive metabolic panel (Completed)   Lipid panel (Completed)   Hemoglobin A1c (Completed)   Depression with anxiety    Does feel Sertraline has helped some but with ongoing stressors would like to increase her Sertraline to 100 mg daily. rx is given today.      Hyperlipidemia    Encouraged heart healthy diet, increase exercise, avoid trans fats, consider a krill oil cap daily      OSA (obstructive sleep apnea) - Primary    Uses CPAP nightly, has noted and recorded increased episodes of snoring again lately.      Relevant Orders   CBC (Completed)   TSH (Completed)   Comprehensive metabolic panel (Completed)   Lipid panel (Completed)   Hemoglobin A1c (Completed)   Hyperglycemia     minimize simple carbs.  Increase exercise as tolerated.       Relevant Orders   CBC (Completed)   TSH (Completed)   Comprehensive metabolic panel (Completed)   Lipid panel (Completed)   Hemoglobin A1c (Completed)    Other Visit Diagnoses    Hyperlipidemia, mixed       Relevant Orders   CBC (Completed)   TSH (Completed)   Comprehensive metabolic panel (Completed)   Lipid panel (Completed)   Hemoglobin A1c (Completed)      I have discontinued Ms. Yaeger's sertraline. I am also having her start on sertraline. Additionally, I am having her maintain her levonorgestrel, Omega-3 Fatty Acids (OMEGA 3 PO), levothyroxine, and cyanocobalamin.  Meds ordered this encounter  Medications  . sertraline (ZOLOFT) 100 MG tablet    Sig: Take 1 tablet (100 mg total) by mouth daily.    Dispense:  90 tablet    Refill:  2     Danise Edge, MD

## 2015-12-17 NOTE — Assessment & Plan Note (Signed)
On Levothyroxine, continue to monitor 

## 2015-12-17 NOTE — Assessment & Plan Note (Signed)
minimize simple carbs. Increase exercise as tolerated.  

## 2015-12-17 NOTE — Assessment & Plan Note (Signed)
Does feel Sertraline has helped some but with ongoing stressors would like to increase her Sertraline to 100 mg daily. rx is given today.

## 2016-03-04 ENCOUNTER — Other Ambulatory Visit: Payer: Self-pay | Admitting: Family Medicine

## 2016-03-15 ENCOUNTER — Encounter: Payer: Self-pay | Admitting: Adult Health

## 2016-03-15 ENCOUNTER — Ambulatory Visit (INDEPENDENT_AMBULATORY_CARE_PROVIDER_SITE_OTHER): Payer: 59 | Admitting: Adult Health

## 2016-03-15 DIAGNOSIS — G4733 Obstructive sleep apnea (adult) (pediatric): Secondary | ICD-10-CM

## 2016-03-15 NOTE — Patient Instructions (Addendum)
Keep up the good work. Continue on C Pap at bedtime Continue to be active at work on weight loss Do not drive a sleepy Follow-up in one year with Dr. Vassie LollAlva   and as needed

## 2016-03-15 NOTE — Progress Notes (Signed)
   Subjective:    Patient ID: Tiffany Morris, female    DOB: 12/18/1965, 50 y.o.   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   03/15/2016 follow-up obstructive sleep apnea Patient presented for one-year follow-up for sleep apnea. Patient remains on C Pap at bedtime. She says she is doing very well on C Pap. She feels rested. Download September 20 , 2017 to 03/14/2016 shows excellent compliance with average usage at 8 hours. She is on a set pressure of 8 cm of H2O. AHI is 1.0. Minimum leaks. She denies significant daytime sleepiness. Feels rested.  She needs supplies sent to DME.   Past Medical History:  Diagnosis Date  . Chicken pox as a child  . Depression with anxiety 08/05/2011  . H/O thyrotoxicosis 06/02/2010   Qualifier: Diagnosis of  By: Cathren HarshBeese MD, Jeannett SeniorStephen  H/o of hyper secondary to thyrotoxicosis   . Hyperglycemia 06/05/2014  . Hyperlipidemia 09/05/2011  . hypothyroidism 06/02/2010  . Obesity   . Pain of left breast 08/05/2011  . Pedal edema 08/05/2011  . Thyroid disease    hyper  . Unspecified sleep apnea 08/20/2013  . Vitamin B 12 deficiency    Current Outpatient Prescriptions on File Prior to Visit  Medication Sig Dispense Refill  . cyanocobalamin (,VITAMIN B-12,) 1000 MCG/ML injection Inject 1 mL (1,000 mcg total) into the muscle once. 10 mL 0  . levonorgestrel (MIRENA) 20 MCG/24HR IUD 1 each by Intrauterine route once.    Marland Kitchen. levothyroxine (SYNTHROID, LEVOTHROID) 150 MCG tablet Take 1 tablet (150 mcg  total) by mouth daily  before breakfast. 90 tablet 4  . Omega-3 Fatty Acids (OMEGA 3 PO) Take by mouth daily.    . sertraline (ZOLOFT) 100 MG tablet Take 1 tablet (100 mg total) by mouth daily. 90 tablet 2   No current facility-administered medications on file prior to visit.      Review of Systems     Objective:   Physical Exam Vitals:   03/15/16 1637    BP: 126/88  Pulse: 94  Temp: 98.4 F (36.9 C)  TempSrc: Oral  SpO2: 96%  Weight: 273 lb (123.8 kg)  Height: 5\' 5"  (1.651 m)  Body mass index is 45.43 kg/m.   GEN: A/Ox3; pleasant , NAD, obese    HEENT:  [place]/AT,  THROAT-clear, no lesions, no postnasal drip or exudate noted. Class 2 -3 MP airway   NECK:  Supple w/ fair ROM; no JVD; normal carotid impulses w/o bruits; no thyromegaly or nodules palpated; no lymphadenopathy.    RESP  Clear  P & A; w/o, wheezes/ rales/ or rhonchi. no accessory muscle use, no dullness to percussion  CARD:  RRR, no m/r/g  , no peripheral edema  Musco: Warm bil, no deformities noted   Neuro: alert, no focal deficits noted.    Skin: Warm, no lesions or rashes  Gaylin Bulthuis NP-C  [facility]  03/15/2016        Assessment & Plan:

## 2016-03-15 NOTE — Assessment & Plan Note (Signed)
Well controlled on CPAP  Excellent control and compliance   Plan  Patient Instructions  Keep up the good work. Continue on C Pap at bedtime Continue to be active at work on weight loss Do not drive a sleepy Follow-up in one year with Dr. Vassie LollAlva   and as needed

## 2016-03-21 ENCOUNTER — Encounter: Payer: Self-pay | Admitting: Adult Health

## 2016-03-22 NOTE — Progress Notes (Signed)
Reviewed & agree with plan  

## 2016-04-05 ENCOUNTER — Ambulatory Visit (INDEPENDENT_AMBULATORY_CARE_PROVIDER_SITE_OTHER): Payer: 59 | Admitting: Family Medicine

## 2016-04-05 ENCOUNTER — Encounter: Payer: Self-pay | Admitting: Family Medicine

## 2016-04-05 DIAGNOSIS — F418 Other specified anxiety disorders: Secondary | ICD-10-CM

## 2016-04-05 DIAGNOSIS — E785 Hyperlipidemia, unspecified: Secondary | ICD-10-CM

## 2016-04-05 DIAGNOSIS — F329 Major depressive disorder, single episode, unspecified: Secondary | ICD-10-CM | POA: Diagnosis not present

## 2016-04-05 DIAGNOSIS — E6609 Other obesity due to excess calories: Secondary | ICD-10-CM

## 2016-04-05 DIAGNOSIS — R739 Hyperglycemia, unspecified: Secondary | ICD-10-CM

## 2016-04-05 DIAGNOSIS — F32A Depression, unspecified: Secondary | ICD-10-CM

## 2016-04-05 MED ORDER — SERTRALINE HCL 100 MG PO TABS: 100.0000 mg | ORAL_TABLET | Freq: Every day | ORAL | 3 refills | Status: DC

## 2016-04-05 NOTE — Progress Notes (Signed)
Pre visit review using our clinic review tool, if applicable. No additional management support is needed unless otherwise documented below in the visit note. 

## 2016-04-05 NOTE — Patient Instructions (Signed)
Cooking light diet plan  DASH Eating Plan DASH stands for "Dietary Approaches to Stop Hypertension." The DASH eating plan is a healthy eating plan that has been shown to reduce high blood pressure (hypertension). Additional health benefits may include reducing the risk of type 2 diabetes mellitus, heart disease, and stroke. The DASH eating plan may also help with weight loss. WHAT DO I NEED TO KNOW ABOUT THE DASH EATING PLAN? For the DASH eating plan, you will follow these general guidelines:  Choose foods with a percent daily value for sodium of less than 5% (as listed on the food label).  Use salt-free seasonings or herbs instead of table salt or sea salt.  Check with your health care provider or pharmacist before using salt substitutes.  Eat lower-sodium products, often labeled as "lower sodium" or "no salt added."  Eat fresh foods.  Eat more vegetables, fruits, and low-fat dairy products.  Choose whole grains. Look for the word "whole" as the first word in the ingredient list.  Choose fish and skinless chicken or [place] more often than red meat. Limit fish, poultry, and meat to 6 oz (170 g) each day.  Limit sweets, desserts, sugars, and sugary drinks.  Choose heart-healthy fats.  Limit cheese to 1 oz (28 g) per day.  Eat more home-cooked food and less restaurant, buffet, and fast food.  Limit fried foods.  Cook foods using methods other than frying.  Limit canned vegetables. If you do use them, rinse them well to decrease the sodium.  When eating at a restaurant, ask that your food be prepared with less salt, or no salt if possible. WHAT FOODS CAN I EAT? Seek help from a dietitian for individual calorie needs. Grains Whole grain or whole wheat bread. Brown rice. Whole grain or whole wheat pasta. Quinoa, bulgur, and whole grain cereals. Low-sodium cereals. Corn or whole wheat flour tortillas. Whole grain cornbread. Whole grain crackers. Low-sodium  crackers. Vegetables Fresh or frozen vegetables (raw, steamed, roasted, or grilled). Low-sodium or reduced-sodium tomato and vegetable juices. Low-sodium or reduced-sodium tomato sauce and paste. Low-sodium or reduced-sodium canned vegetables.  Fruits All fresh, canned (in natural juice), or frozen fruits. Meat and Other Protein Products Ground beef (85% or leaner), grass-fed beef, or beef trimmed of fat. Skinless chicken or [place]. Ground chicken or [place]key. Pork trimmed of fat. All fish and seafood. Eggs. Dried beans, peas, or lentils. Unsalted nuts and seeds. Unsalted canned beans. Dairy Low-fat dairy products, such as skim or 1% milk, 2% or reduced-fat cheeses, low-fat ricotta or cottage cheese, or plain low-fat yogurt. Low-sodium or reduced-sodium cheeses. Fats and Oils Tub margarines without trans fats. Light or reduced-fat mayonnaise and salad dressings (reduced sodium). Avocado. Safflower, olive, or canola oils. Natural peanut or almond butter. Other Unsalted popcorn and pretzels. The items listed above may not be a complete list of recommended foods or beverages. Contact your dietitian for more options. WHAT FOODS ARE NOT RECOMMENDED? Grains White bread. White pasta. White rice. Refined cornbread. Bagels and croissants. Crackers that contain trans fat. Vegetables Creamed or fried vegetables. Vegetables in a cheese sauce. Regular canned vegetables. Regular canned tomato sauce and paste. Regular tomato and vegetable juices. Fruits Dried fruits. Canned fruit in light or heavy syrup. Fruit juice. Meat and Other Protein Products Fatty cuts of meat. Ribs, chicken wings, bacon, sausage, bologna, salami, chitterlings, fatback, hot dogs, bratwurst, and packaged luncheon meats. Salted nuts and seeds. Canned beans with salt. Dairy Whole or 2% milk, cream, half-and-half, and cream cheese.  Whole-fat or sweetened yogurt. Full-fat cheeses or blue cheese. Nondairy creamers and whipped toppings.  Processed cheese, cheese spreads, or cheese curds. Condiments Onion and garlic salt, seasoned salt, table salt, and sea salt. Canned and packaged gravies. Worcestershire sauce. Tartar sauce. Barbecue sauce. Teriyaki sauce. Soy sauce, including reduced sodium. Steak sauce. Fish sauce. Oyster sauce. Cocktail sauce. Horseradish. Ketchup and mustard. Meat flavorings and tenderizers. Bouillon cubes. Hot sauce. Tabasco sauce. Marinades. Taco seasonings. Relishes. Fats and Oils Butter, stick margarine, lard, shortening, ghee, and bacon fat. Coconut, palm kernel, or palm oils. Regular salad dressings. Other Pickles and olives. Salted popcorn and pretzels. The items listed above may not be a complete list of foods and beverages to avoid. Contact your dietitian for more information. WHERE CAN I FIND MORE INFORMATION? National Heart, Lung, and Blood Institute: travelstabloid.com   This information is not intended to replace advice given to you by your health care provider. Make sure you discuss any questions you have with your health care provider.   Document Released: 05/02/2011 Document Revised: 06/03/2014 Document Reviewed: 03/17/2013 Elsevier Interactive Patient Education [employer].

## 2016-04-14 NOTE — Assessment & Plan Note (Signed)
hgba1c acceptable, minimize simple carbs. Increase exercise as tolerated.  

## 2016-04-14 NOTE — Assessment & Plan Note (Signed)
Encouraged DASH diet, decrease po intake and increase exercise as tolerated. Needs 7-8 hours of sleep nightly. Avoid trans fats, eat small, frequent meals every 4-5 hours with lean proteins, complex carbs and healthy fats. Minimize simple carbs 

## 2016-04-14 NOTE — Assessment & Plan Note (Signed)
Encouraged heart healthy diet, increase exercise, avoid trans fats, consider a krill oil cap daily 

## 2016-04-14 NOTE — Assessment & Plan Note (Addendum)
Restart Sertraline and follow up as needed or in 10 to 12 weeks. Referred to behavioral health for counseling

## 2016-04-14 NOTE — Progress Notes (Signed)
Patient ID: Tiffany Morris, female   DOB: August 24, 1965, 50 y.o.   MRN: [id number]   Subjective:    Patient ID: Tiffany Morris, female    DOB: 11-15-1965, 50 y.o.   MRN: [id number]  Chief Complaint  Patient presents with  . Follow-up    HPI Patient is in today for follow up. She has been struggling with increased stress and anxiety. She acknowledges anhedonia but denies suicidal ideation. No recent illness or other acute concerns. Is interested in restarting Sertraline. Denies CP/palp/SOB/HA/congestion/fevers/GI or GU c/o. Taking meds as prescribed  Past Medical History:  Diagnosis Date  . Chicken pox as a child  . Depression with anxiety 08/05/2011  . 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 Hyperglycemia 06/05/2014  . Hyperlipidemia 09/05/2011  . hypothyroidism 06/02/2010  . Obesity   . Pain of left breast 08/05/2011  . Pedal edema 08/05/2011  . Thyroid disease    hyper  . Unspecified sleep apnea 08/20/2013  . Vitamin B 12 deficiency     Past Surgical History:  Procedure Laterality Date  . CESAREAN SECTION  2004  . CHOLECYSTECTOMY     laparoscopic  . COLONOSCOPY  2014  . RETINAL DETACHMENT REPAIR W/ SCLERAL BUCKLE LE  01-25-10    Family History  Problem Relation Age of Onset  . Hyperlipidemia Mother   . Diabetes Mother     borderline  . Hyperlipidemia Father   . Hypertension Father   . Diabetes Father     type 2  . Hyperthyroidism Father   . Obesity Father   . Cancer Maternal Grandmother     lung- smoker  . COPD Maternal Grandfather     smoked  . Cancer Maternal Grandfather     bladder  . Heart disease Paternal Grandmother     CHF, MI  . Hypothyroidism Paternal Grandmother   . Hypertension Paternal Grandmother   . Hypertension Paternal Grandfather   . Heart disease Paternal Grandfather     open heart surgery  . Hyperlipidemia Paternal Grandfather   . Macular degeneration  Paternal Grandfather     Social History   Social History  . Marital status: Married    Spouse name: N/A  . Number of children: N/A  . Years of education: N/A   Occupational History  . NP [employer]   Social History Main Topics  . Smoking status: Never Smoker  . Smokeless tobacco: Current User  . Alcohol use No  . Drug use: No  . Sexual activity: Not Currently     Comment: lives with husband, son age 85, no dietary restrictions.. Nurse Practiitoner with [place]   Other Topics Concern  . 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     Outpatient Medications Prior to Visit  Medication Sig Dispense Refill  . cyanocobalamin (,VITAMIN B-12,) 1000 MCG/ML injection Inject 1 mL (1,000 mcg total) into the muscle once. 10 mL 0  . levonorgestrel (MIRENA) 20 MCG/24HR IUD 1 each by Intrauterine route once.    Marland Kitchen levothyroxine (SYNTHROID, LEVOTHROID) 150 MCG tablet Take 1 tablet (150 mcg  total) by mouth daily  before breakfast. 90 tablet 4  . Omega-3 Fatty Acids (OMEGA 3 PO) Take by mouth daily.    . sertraline (ZOLOFT) 100 MG tablet Take 1 tablet (100 mg  total) by mouth daily. 90 tablet 2   No facility-administered medications prior to visit.     No Known Allergies  Review of Systems  Constitutional: Positive for malaise/fatigue. Negative for fever.  HENT: Negative for congestion.   Eyes: Negative for blurred vision.  Respiratory: Negative for shortness of breath.   Cardiovascular: Negative for chest pain, palpitations and leg swelling.  Gastrointestinal: Negative for abdominal pain, blood in stool and nausea.  Genitourinary: Negative for dysuria and frequency.  Musculoskeletal: Negative for falls.  Skin: Negative for rash.  Neurological: Negative for dizziness, loss of consciousness and headaches.  Endo/Heme/Allergies: Negative for environmental allergies.  Psychiatric/Behavioral:  Positive for depression. The patient is nervous/anxious.        Objective:    Physical Exam  Constitutional: She is oriented to person, place, and time. She appears well-developed and well-nourished. No distress.  HENT:  Head: Normocephalic and atraumatic.  Nose: Nose normal.  Eyes: Right eye exhibits no discharge. Left eye exhibits no discharge.  Neck: Normal range of motion. Neck supple.  Cardiovascular: Normal rate and regular rhythm.   No murmur heard. Pulmonary/Chest: Effort normal and breath sounds normal.  Abdominal: Soft. Bowel sounds are normal. There is no tenderness.  Musculoskeletal: She exhibits no edema.  Neurological: She is alert and oriented to person, place, and time.  Skin: Skin is warm and dry.  Psychiatric: She has a normal mood and affect.  Nursing note and vitals reviewed.   BP 118/82 (BP Location: Left Arm, Patient Position: Sitting, Cuff Size: Large)   Pulse 80   Temp 98 F (36.7 C) (Oral)   Ht 5\' 5"  (1.651 m)   Wt 274 lb 2 oz (124.3 kg)   SpO2 96%   BMI 45.62 kg/m  Wt Readings from Last 3 Encounters:  04/05/16 274 lb 2 oz (124.3 kg)  03/15/16 273 lb (123.8 kg)  12/04/15 269 lb (122 kg)     Lab Results  Component Value Date   WBC 10.2 12/04/2015   HGB 14.3 12/04/2015   HCT 42.7 12/04/2015   PLT 272.0 12/04/2015   GLUCOSE 147 (H) 12/04/2015   CHOL 223 (H) 12/04/2015   TRIG 196.0 (H) 12/04/2015   HDL 42.90 12/04/2015   LDLDIRECT 168.3 06/02/2014   LDLCALC 141 (H) 12/04/2015   ALT 18 12/04/2015   AST 16 12/04/2015   NA 139 12/04/2015   K 3.6 12/04/2015   CL 105 12/04/2015   CREATININE 0.84 12/04/2015   BUN 12 12/04/2015   CO2 28 12/04/2015   TSH 0.54 12/04/2015   HGBA1C 5.6 12/04/2015    Lab Results  Component Value Date   TSH 0.54 12/04/2015   Lab Results  Component Value Date   WBC 10.2 12/04/2015   HGB 14.3 12/04/2015   HCT 42.7 12/04/2015   MCV 85.6 12/04/2015   PLT 272.0 12/04/2015   Lab Results  Component Value  Date   NA 139 12/04/2015   K 3.6 12/04/2015   CO2 28 12/04/2015   GLUCOSE 147 (H) 12/04/2015   BUN 12 12/04/2015   CREATININE 0.84 12/04/2015   BILITOT 0.4 12/04/2015   ALKPHOS 68 12/04/2015   AST 16 12/04/2015   ALT 18 12/04/2015   PROT 7.5 12/04/2015   ALBUMIN 4.0 12/04/2015   CALCIUM 9.2 12/04/2015   GFR 76.95 12/04/2015   Lab Results  Component Value Date   CHOL 223 (H) 12/04/2015   Lab Results  Component Value Date   HDL 42.90 12/04/2015   Lab Results  Component Value Date   LDLCALC 141 (H) 12/04/2015   Lab Results  Component Value Date   TRIG 196.0 (H) 12/04/2015   Lab Results  Component Value Date   CHOLHDL 5 12/04/2015   Lab Results  Component Value Date   HGBA1C 5.6 12/04/2015       Assessment & Plan:   Problem List Items Addressed This Visit    Obesity    Encouraged DASH diet, decrease po intake and increase exercise as tolerated. Needs 7-8 hours of sleep nightly. Avoid trans fats, eat small, frequent meals every 4-5 hours with lean proteins, complex carbs and healthy fats. Minimize simple carbs      Depression with anxiety    Restart Sertraline and follow up as needed or in 10 to 12 weeks. Referred to behavioral health for counseling       Hyperlipidemia    Encouraged heart healthy diet, increase exercise, avoid trans fats, consider a krill oil cap daily      Hyperglycemia    hgba1c acceptable, minimize simple carbs. Increase exercise as tolerated.       Other Visit Diagnoses    Morbid obesity (HCC)    -  Primary   Relevant Orders   Ambulatory referral to Behavioral Health   Depression, unspecified depression type       Relevant Medications   sertraline (ZOLOFT) 100 MG tablet   Other Relevant Orders   Ambulatory referral to Behavioral Health      I am having Ms. Augsburger maintain her levonorgestrel, Omega-3 Fatty Acids (OMEGA 3 PO), levothyroxine, cyanocobalamin, and sertraline.  Meds ordered this encounter  Medications  .  sertraline (ZOLOFT) 100 MG tablet    Sig: Take 1 tablet (100 mg total) by mouth daily.    Dispense:  90 tablet    Refill:  3     Danise EdgeBLYTH, STACEY, MD

## 2016-06-17 ENCOUNTER — Other Ambulatory Visit: Payer: Self-pay | Admitting: Family Medicine

## 2016-06-21 ENCOUNTER — Telehealth: Payer: Self-pay | Admitting: Family Medicine

## 2016-06-21 MED ORDER — OSELTAMIVIR PHOSPHATE 75 MG PO CAPS: 75.0000 mg | ORAL_CAPSULE | Freq: Every day | ORAL | 0 refills | Status: DC

## 2016-06-21 NOTE — Telephone Encounter (Signed)
Please send in Tamiflu 75 mg tab, 1 tab po daily x 10 days. Disp #10

## 2016-06-21 NOTE — Telephone Encounter (Signed)
Patient called stating that her son has been diagnosed with influenza A. She stated that his doctor recommended she call and get tamiflu prescribed for her. Please advise  Phone: [phone]  [facility]acy: [facility] [address] - [place], [place] - [address]

## 2016-06-21 NOTE — Telephone Encounter (Signed)
Call[facility] the [facility] [place], Tamiflu

## 2016-08-06 ENCOUNTER — Other Ambulatory Visit: Payer: Self-pay | Admitting: Family Medicine

## 2016-08-06 DIAGNOSIS — Z1231 Encounter for screening mammogram for malignant neoplasm of breast: Secondary | ICD-10-CM

## 2016-08-08 ENCOUNTER — Ambulatory Visit (HOSPITAL_BASED_OUTPATIENT_CLINIC_OR_DEPARTMENT_OTHER): Payer: 59

## 2016-08-12 ENCOUNTER — Ambulatory Visit (HOSPITAL_BASED_OUTPATIENT_CLINIC_OR_DEPARTMENT_OTHER)
Admission: RE | Admit: 2016-08-12 | Discharge: 2016-08-12 | Disposition: A | Discharge status: Home / Self Care | Payer: 59 | Source: Ambulatory Visit | Attending: Family Medicine | Admitting: Family Medicine

## 2016-08-12 DIAGNOSIS — Z1231 Encounter for screening mammogram for malignant neoplasm of breast: Secondary | ICD-10-CM

## 2016-08-13 ENCOUNTER — Ambulatory Visit: Payer: 59 | Admitting: Family Medicine

## 2016-09-10 ENCOUNTER — Ambulatory Visit: Payer: 59 | Admitting: Family Medicine

## 2016-09-30 ENCOUNTER — Other Ambulatory Visit: Payer: Self-pay | Admitting: Family Medicine

## 2016-10-15 ENCOUNTER — Encounter: Payer: Self-pay | Admitting: Family Medicine

## 2016-10-15 ENCOUNTER — Ambulatory Visit (INDEPENDENT_AMBULATORY_CARE_PROVIDER_SITE_OTHER): Payer: 59 | Admitting: Family Medicine

## 2016-10-15 ENCOUNTER — Ambulatory Visit: Payer: 59 | Admitting: Family Medicine

## 2016-10-15 DIAGNOSIS — F418 Other specified anxiety disorders: Secondary | ICD-10-CM

## 2016-10-15 DIAGNOSIS — R739 Hyperglycemia, unspecified: Secondary | ICD-10-CM

## 2016-10-15 DIAGNOSIS — G4733 Obstructive sleep apnea (adult) (pediatric): Secondary | ICD-10-CM | POA: Diagnosis not present

## 2016-10-15 DIAGNOSIS — E785 Hyperlipidemia, unspecified: Secondary | ICD-10-CM | POA: Diagnosis not present

## 2016-10-15 DIAGNOSIS — E538 Deficiency of other specified B group vitamins: Secondary | ICD-10-CM

## 2016-10-15 NOTE — Assessment & Plan Note (Signed)
Using CPAP regularly and feels well

## 2016-10-15 NOTE — Assessment & Plan Note (Signed)
Doing well with recent job change.

## 2016-10-15 NOTE — Assessment & Plan Note (Addendum)
hgba1c acceptable, minimize simple carbs. Increase exercise as tolerated.  

## 2016-10-15 NOTE — Progress Notes (Signed)
Patient ID: Tiffany Morris, female   DOB: 1965-11-07, 51 y.o.   MRN: [id number]   Subjective:  I acted as a Neurosurgeoncribe for [name], MD. Diamond NickelBeatrice, [place]MA   Patient ID: Tiffany Morris, female    DOB: 1965-11-07, 51 y.o.   MRN: [id number]  No chief complaint on file.   HPI  Patient is in today for a four month follow up. Patient has a Hx of OSA, Vitamin B12 deficency, obesity, hyperglycemia, hyperlipidemia, pedal edema. Patient has no acute concerns noted at this time. No recent febrile illness or hospitalization. Denies CP/palp/SOB/HA/congestion/fevers/GI or GU c/o. Taking meds as prescribed  Patient Care Team: Bradd CanaryBlyth, Yenny Kosa A, MD as PCP - General (Family Medicine) Genia DelLavoie, Marie-Lyne, MD as Consulting Physician (Obstetrics and Gynecology) Oretha MilchAlva, Rakesh V, MD as Consulting Physician (Pulmonary Disease) Jeani HawkingHung, Patrick, MD as Consulting Physician (Gastroenterology)   Past Medical History:  Diagnosis Date  . Chicken pox as a child  . Depression with anxiety 08/05/2011  . H/O thyrotoxicosis 06/02/2010   Qualifier: Diagnosis of  By: Cathren HarshBeese MD, Jeannett SeniorStephen  H/o of hyper secondary to thyrotoxicosis   . Hyperglycemia 06/05/2014  . Hyperlipidemia 09/05/2011  . hypothyroidism 06/02/2010  . Obesity   . Pain of left breast 08/05/2011  . Pedal edema 08/05/2011  . Thyroid disease    hyper  . Unspecified sleep apnea 08/20/2013  . Vitamin B 12 deficiency     Past Surgical History:  Procedure Laterality Date  . CESAREAN SECTION  2004  . CHOLECYSTECTOMY     laparoscopic  . COLONOSCOPY  2014  . RETINAL DETACHMENT REPAIR W/ SCLERAL BUCKLE LE  01-25-10    Family History  Problem Relation Age of Onset  . Hyperlipidemia Mother   . Diabetes Mother        borderline  . Hyperlipidemia Father   . Hypertension Father   . Diabetes Father        type 2  . Hyperthyroidism Father   . Obesity Father   . Cancer Maternal Grandmother        lung- smoker  . COPD Maternal Grandfather        smoked  . Cancer  Maternal Grandfather        bladder  . Heart disease Paternal Grandmother        CHF, MI  . Hypothyroidism Paternal Grandmother   . Hypertension Paternal Grandmother   . Hypertension Paternal Grandfather   . Heart disease Paternal Grandfather        open heart surgery  . Hyperlipidemia Paternal Grandfather   . Macular degeneration Paternal Grandfather     Social History   Social History  . Marital status: Married    Spouse name: N/A  . Number of children: N/A  . Years of education: N/A   Occupational History  . NP [employer]   Social History Main Topics  . Smoking status: Never Smoker  . Smokeless tobacco: Current User  . Alcohol use No  . Drug use: No  . Sexual activity: Not Currently     Comment: lives with husband, son age 49, no dietary restrictions.. Nurse Practiitoner with [place]   Other Topics Concern  . 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     Outpatient Medications Prior to Visit  Medication Sig Dispense Refill  . cyanocobalamin (,VITAMIN B-12,) 1000 MCG/ML injection Inject 1 mL (1,000 mcg total)  into the muscle once. 10 mL 0  . levonorgestrel (MIRENA) 20 MCG/24HR IUD 1 each by Intrauterine route once.    Marland Kitchen levothyroxine (SYNTHROID, LEVOTHROID) 150 MCG tablet TAKE ONE TABLET BY MOUTH ONCE DAILY BEFORE BREAKFAST 90 tablet 0  . Omega-3 Fatty Acids (OMEGA 3 PO) Take by mouth daily.    . sertraline (ZOLOFT) 100 MG tablet Take 1 tablet (100 mg total) by mouth daily. 90 tablet 3  . levothyroxine (SYNTHROID, LEVOTHROID) 150 MCG tablet Take 1 tablet (150 mcg  total) by mouth daily  before breakfast. (Patient not taking: Reported on 10/15/2016) 90 tablet 4  . oseltamivir (TAMIFLU) 75 MG capsule Take 1 capsule (75 mg total) by mouth daily. (Patient not taking: Reported on 10/15/2016) 10 capsule 0   No facility-administered medications prior to visit.     No Known  Allergies  Review of Systems  Constitutional: Negative for fever and malaise/fatigue.  HENT: Negative for congestion.   Eyes: Negative for blurred vision.  Respiratory: Negative for cough and shortness of breath.   Cardiovascular: Negative for chest pain, palpitations and leg swelling.  Gastrointestinal: Negative for vomiting.  Musculoskeletal: Negative for back pain.  Skin: Negative for rash.  Neurological: Negative for loss of consciousness and headaches.       Objective:    Physical Exam  Constitutional: She is oriented to person, place, and time. She appears well-developed and well-nourished. No distress.  HENT:  Head: Normocephalic and atraumatic.  Eyes: Conjunctivae are normal.  Neck: Normal range of motion. No thyromegaly present.  Cardiovascular: Normal rate and regular rhythm.   Pulmonary/Chest: Effort normal and breath sounds normal. She has no wheezes.  Abdominal: Soft. Bowel sounds are normal. There is no tenderness.  Musculoskeletal: She exhibits no edema or deformity.  Neurological: She is alert and oriented to person, place, and time.  Skin: Skin is warm and dry. She is not diaphoretic.  Psychiatric: She has a normal mood and affect.    BP 122/70 (BP Location: Right Wrist, Patient Position: Sitting, Cuff Size: Large)   Pulse 91   Temp 98.4 F (36.9 C) (Oral)   Wt 261 lb 12.8 oz (118.8 kg)   SpO2 99% Comment: RA  BMI 43.57 kg/m  Wt Readings from Last 3 Encounters:  10/15/16 261 lb 12.8 oz (118.8 kg)  04/05/16 274 lb 2 oz (124.3 kg)  03/15/16 273 lb (123.8 kg)   BP Readings from Last 3 Encounters:  10/15/16 122/70  04/05/16 118/82  03/15/16 126/88     Immunization History  Administered Date(s) Administered  . Influenza Split 02/24/2013  . Influenza Whole 02/25/2011  . Influenza,inj,Quad PF,36+ Mos 06/02/2014, 03/13/2015, 03/14/2016  . Tdap 05/27/2009    Health Maintenance  Topic Date Due  . PAP SMEAR  10/10/2013  . INFLUENZA VACCINE   12/25/2016  . TETANUS/TDAP  05/28/2019  . HIV Screening  Addressed    Lab Results  Component Value Date   WBC 10.2 12/04/2015   HGB 14.3 12/04/2015   HCT 42.7 12/04/2015   PLT 272.0 12/04/2015   GLUCOSE 147 (H) 12/04/2015   CHOL 223 (H) 12/04/2015   TRIG 196.0 (H) 12/04/2015   HDL 42.90 12/04/2015   LDLDIRECT 168.3 06/02/2014   LDLCALC 141 (H) 12/04/2015   ALT 18 12/04/2015   AST 16 12/04/2015   NA 139 12/04/2015   K 3.6 12/04/2015   CL 105 12/04/2015   CREATININE 0.84 12/04/2015   BUN 12 12/04/2015   CO2 28 12/04/2015   TSH 0.54 12/04/2015  HGBA1C 5.6 12/04/2015    Lab Results  Component Value Date   TSH 0.54 12/04/2015   Lab Results  Component Value Date   WBC 10.2 12/04/2015   HGB 14.3 12/04/2015   HCT 42.7 12/04/2015   MCV 85.6 12/04/2015   PLT 272.0 12/04/2015   Lab Results  Component Value Date   NA 139 12/04/2015   K 3.6 12/04/2015   CO2 28 12/04/2015   GLUCOSE 147 (H) 12/04/2015   BUN 12 12/04/2015   CREATININE 0.84 12/04/2015   BILITOT 0.4 12/04/2015   ALKPHOS 68 12/04/2015   AST 16 12/04/2015   ALT 18 12/04/2015   PROT 7.5 12/04/2015   ALBUMIN 4.0 12/04/2015   CALCIUM 9.2 12/04/2015   GFR 76.95 12/04/2015   Lab Results  Component Value Date   CHOL 223 (H) 12/04/2015   Lab Results  Component Value Date   HDL 42.90 12/04/2015   Lab Results  Component Value Date   LDLCALC 141 (H) 12/04/2015   Lab Results  Component Value Date   TRIG 196.0 (H) 12/04/2015   Lab Results  Component Value Date   CHOLHDL 5 12/04/2015   Lab Results  Component Value Date   HGBA1C 5.6 12/04/2015         Assessment & Plan:   Problem List Items Addressed This Visit    Vitamin B 12 deficiency    Does own shots. Check level      Relevant Orders   Vitamin B12   CBC   Depression with anxiety    Doing well with recent job change.       Hyperlipidemia    Encouraged heart healthy diet, increase exercise, avoid trans fats, consider a krill oil  cap daily      Relevant Orders   Lipid panel   TSH   OSA (obstructive sleep apnea)    Using CPAP regularly and feels well      Hyperglycemia    hgba1c acceptable, minimize simple carbs. Increase exercise as tolerated.       Relevant Orders   Hemoglobin A1c   Comprehensive metabolic panel   TSH      I have discontinued Ms. Schall's oseltamivir. I am also having her maintain her levonorgestrel, Omega-3 Fatty Acids (OMEGA 3 PO), cyanocobalamin, sertraline, and levothyroxine.  No orders of the defined types were placed in this encounter.   CMA served as Neurosurgeon during this visit. History, Physical and Plan performed by medical provider. Documentation and orders reviewed and attested to.  Danise Edge, MD

## 2016-10-15 NOTE — Progress Notes (Signed)
Pre visit review using our clinic review tool, if applicable. No additional management support is needed unless otherwise documented below in the visit note. 

## 2016-10-15 NOTE — Assessment & Plan Note (Signed)
Does own shots. Check level

## 2016-10-15 NOTE — Assessment & Plan Note (Signed)
Encouraged heart healthy diet, increase exercise, avoid trans fats, consider a krill oil cap daily 

## 2016-10-15 NOTE — Patient Instructions (Signed)
DASH Eating Plan DASH stands for "Dietary Approaches to Stop Hypertension." The DASH eating plan is a healthy eating plan that has been shown to reduce high blood pressure (hypertension). It may also reduce your risk for type 2 diabetes, heart disease, and stroke. The DASH eating plan may also help with weight loss. What are tips for following this plan? General guidelines  Avoid eating more than 2,300 mg (milligrams) of salt (sodium) a day. If you have hypertension, you may need to reduce your sodium intake to 1,500 mg a day.  Limit alcohol intake to no more than 1 drink a day for nonpregnant women and 2 drinks a day for men. One drink equals 12 oz of beer, 5 oz of wine, or 1 oz of hard liquor.  Work with your health care provider to maintain a healthy body weight or to lose weight. Ask what an ideal weight is for you.  Get at least 30 minutes of exercise that causes your heart to beat faster (aerobic exercise) most days of the week. Activities may include walking, swimming, or biking.  Work with your health care provider or diet and nutrition specialist (dietitian) to adjust your eating plan to your individual calorie needs. Reading food labels  Check food labels for the amount of sodium per serving. Choose foods with less than 5 percent of the Daily Value of sodium. Generally, foods with less than 300 mg of sodium per serving fit into this eating plan.  To find whole grains, look for the word "whole" as the first word in the ingredient list. Shopping  Buy products labeled as "low-sodium" or "no salt added."  Buy fresh foods. Avoid canned foods and premade or frozen meals. Cooking  Avoid adding salt when cooking. Use salt-free seasonings or herbs instead of table salt or sea salt. Check with your health care provider or pharmacist before using salt substitutes.  Do not fry foods. Cook foods using healthy methods such as baking, boiling, grilling, and broiling instead.  Cook with  heart-healthy oils, such as olive, canola, soybean, or sunflower oil. Meal planning   Eat a balanced diet that includes: ? 5 or more servings of fruits and vegetables each day. At each meal, try to fill half of your plate with fruits and vegetables. ? Up to 6-8 servings of whole grains each day. ? Less than 6 oz of lean meat, poultry, or fish each day. A 3-oz serving of meat is about the same size as a deck of cards. One egg equals 1 oz. ? 2 servings of low-fat dairy each day. ? A serving of nuts, seeds, or beans 5 times each week. ? Heart-healthy fats. Healthy fats called Omega-3 fatty acids are found in foods such as flaxseeds and coldwater fish, like sardines, salmon, and mackerel.  Limit how much you eat of the following: ? Canned or prepackaged foods. ? Food that is high in trans fat, such as fried foods. ? Food that is high in saturated fat, such as fatty meat. ? Sweets, desserts, sugary drinks, and other foods with added sugar. ? Full-fat dairy products.  Do not salt foods before eating.  Try to eat at least 2 vegetarian meals each week.  Eat more home-cooked food and less restaurant, buffet, and fast food.  When eating at a restaurant, ask that your food be prepared with less salt or no salt, if possible. What foods are recommended? The items listed may not be a complete list. Talk with your dietitian about what   dietary choices are best for you. Grains Whole-grain or whole-wheat bread. Whole-grain or whole-wheat pasta. Brown rice. Oatmeal. Quinoa. Bulgur. Whole-grain and low-sodium cereals. Pita bread. Low-fat, low-sodium crackers. Whole-wheat flour tortillas. Vegetables Fresh or frozen vegetables (raw, steamed, roasted, or grilled). Low-sodium or reduced-sodium tomato and vegetable juice. Low-sodium or reduced-sodium tomato sauce and tomato paste. Low-sodium or reduced-sodium canned vegetables. Fruits All fresh, dried, or frozen fruit. Canned fruit in natural juice (without  added sugar). Meat and other protein foods Skinless chicken or turkey. Ground chicken or turkey. Pork with fat trimmed off. Fish and seafood. Egg whites. Dried beans, peas, or lentils. Unsalted nuts, nut butters, and seeds. Unsalted canned beans. Lean cuts of beef with fat trimmed off. Low-sodium, lean deli meat. Dairy Low-fat (1%) or fat-free (skim) milk. Fat-free, low-fat, or reduced-fat cheeses. Nonfat, low-sodium ricotta or cottage cheese. Low-fat or nonfat yogurt. Low-fat, low-sodium cheese. Fats and oils Soft margarine without trans fats. Vegetable oil. Low-fat, reduced-fat, or light mayonnaise and salad dressings (reduced-sodium). Canola, safflower, olive, soybean, and sunflower oils. Avocado. Seasoning and other foods Herbs. Spices. Seasoning mixes without salt. Unsalted popcorn and pretzels. Fat-free sweets. What foods are not recommended? The items listed may not be a complete list. Talk with your dietitian about what dietary choices are best for you. Grains Baked goods made with fat, such as croissants, muffins, or some breads. Dry pasta or rice meal packs. Vegetables Creamed or fried vegetables. Vegetables in a cheese sauce. Regular canned vegetables (not low-sodium or reduced-sodium). Regular canned tomato sauce and paste (not low-sodium or reduced-sodium). Regular tomato and vegetable juice (not low-sodium or reduced-sodium). Pickles. Olives. Fruits Canned fruit in a light or heavy syrup. Fried fruit. Fruit in cream or butter sauce. Meat and other protein foods Fatty cuts of meat. Ribs. Fried meat. Bacon. Sausage. Bologna and other processed lunch meats. Salami. Fatback. Hotdogs. Bratwurst. Salted nuts and seeds. Canned beans with added salt. Canned or smoked fish. Whole eggs or egg yolks. Chicken or turkey with skin. Dairy Whole or 2% milk, cream, and half-and-half. Whole or full-fat cream cheese. Whole-fat or sweetened yogurt. Full-fat cheese. Nondairy creamers. Whipped toppings.  Processed cheese and cheese spreads. Fats and oils Butter. Stick margarine. Lard. Shortening. Ghee. Bacon fat. Tropical oils, such as coconut, palm kernel, or palm oil. Seasoning and other foods Salted popcorn and pretzels. Onion salt, garlic salt, seasoned salt, table salt, and sea salt. Worcestershire sauce. Tartar sauce. Barbecue sauce. Teriyaki sauce. Soy sauce, including reduced-sodium. Steak sauce. Canned and packaged gravies. Fish sauce. Oyster sauce. Cocktail sauce. Horseradish that you find on the shelf. Ketchup. Mustard. Meat flavorings and tenderizers. Bouillon cubes. Hot sauce and Tabasco sauce. Premade or packaged marinades. Premade or packaged taco seasonings. Relishes. Regular salad dressings. Where to find more information:  National Heart, Lung, and Blood Institute: www.nhlbi.nih.gov  American Heart Association: www.heart.org Summary  The DASH eating plan is a healthy eating plan that has been shown to reduce high blood pressure (hypertension). It may also reduce your risk for type 2 diabetes, heart disease, and stroke.  With the DASH eating plan, you should limit salt (sodium) intake to 2,300 mg a day. If you have hypertension, you may need to reduce your sodium intake to 1,500 mg a day.  When on the DASH eating plan, aim to eat more fresh fruits and vegetables, whole grains, lean proteins, low-fat dairy, and heart-healthy fats.  Work with your health care provider or diet and nutrition specialist (dietitian) to adjust your eating plan to your individual   calorie needs. This information is not intended to replace advice given to you by your health care provider. Make sure you discuss any questions you have with your health care provider. Document Released: 05/02/2011 Document Revised: 05/06/2016 Document Reviewed: 05/06/2016 Elsevier Interactive Patient Education  2017 [employer]  

## 2017-01-02 ENCOUNTER — Other Ambulatory Visit: Payer: Self-pay | Admitting: Family Medicine

## 2017-01-16 ENCOUNTER — Other Ambulatory Visit: Payer: Self-pay | Admitting: Family Medicine

## 2017-01-17 MED ORDER — CYANOCOBALAMIN 1000 MCG/ML IJ SOLN: 1000.0000 ug | Freq: Once | INTRAMUSCULAR | 0 refills | Status: DC

## 2017-01-22 ENCOUNTER — Encounter: Payer: Self-pay | Admitting: Family Medicine

## 2017-01-24 ENCOUNTER — Other Ambulatory Visit: Payer: Self-pay

## 2017-01-24 MED ORDER — BUPROPION HCL ER (XL) 300 MG PO TB24: 300.0000 mg | ORAL_TABLET | Freq: Every day | ORAL | 1 refills | Status: DC

## 2017-01-24 MED ORDER — BUPROPION HCL ER (XL) 150 MG PO TB24: 150.0000 mg | ORAL_TABLET | Freq: Every day | ORAL | 0 refills | Status: DC

## 2017-01-24 NOTE — Telephone Encounter (Signed)
I LMOVM stating that the Sx were likely due to Naltrexone and that SB would like to try a different medication at 1qd x7d then increase to 2qd and to RTC to discuss/Will advise of Wellbutrin XL 150mg  1qd x7d; then increase to 2qd/will fax #30 and then also fax 300mg  1qd #30 so that PA will not be required and will make pt aware/thx dmf

## 2017-01-24 NOTE — Telephone Encounter (Signed)
Patient aware to take Wellbutrin XL 150mg  1qd x7d then increase to 2qd/#30 faxed/she is also aware that when she gets done with titrating with the 150mg  Rx to have pharmacy fill Rx for the 300mg  and to take 1qd there after/thx dmf

## 2017-02-04 ENCOUNTER — Other Ambulatory Visit (INDEPENDENT_AMBULATORY_CARE_PROVIDER_SITE_OTHER): Payer: 59

## 2017-02-04 DIAGNOSIS — E538 Deficiency of other specified B group vitamins: Secondary | ICD-10-CM

## 2017-02-04 DIAGNOSIS — E785 Hyperlipidemia, unspecified: Secondary | ICD-10-CM | POA: Diagnosis not present

## 2017-02-04 DIAGNOSIS — R739 Hyperglycemia, unspecified: Secondary | ICD-10-CM

## 2017-02-04 LAB — COMPREHENSIVE METABOLIC PANEL
ALBUMIN: 4 g/dL (ref 3.5–5.2)
ALT: 38 U/L — AB (ref 0–35)
AST: 28 U/L (ref 0–37)
Alkaline Phosphatase: 54 U/L (ref 39–117)
BILIRUBIN TOTAL: 0.4 mg/dL (ref 0.2–1.2)
BUN: 11 mg/dL (ref 6–23)
CHLORIDE: 105 meq/L (ref 96–112)
CO2: 28 meq/L (ref 19–32)
CREATININE: 0.9 mg/dL (ref 0.40–1.20)
Calcium: 9.8 mg/dL (ref 8.4–10.5)
GFR: 70.72 mL/min (ref >60.00)
Glucose, Bld: 102 mg/dL — ABNORMAL HIGH (ref 70–99)
Potassium: 4 mEq/L (ref 3.5–5.1)
SODIUM: 140 meq/L (ref 135–145)
Total Protein: 7.4 g/dL (ref 6.0–8.3)

## 2017-02-04 LAB — VITAMIN B12: Vitamin B-12: 623 pg/mL (ref 211–911)

## 2017-02-04 LAB — LIPID PANEL
CHOL/HDL RATIO: 5
CHOLESTEROL: 205 mg/dL — AB (ref 0–200)
HDL: 38.2 mg/dL — ABNORMAL LOW (ref >39.00)
LDL CALC: 144 mg/dL — AB (ref 0–99)
NonHDL: 166.91
Triglycerides: 115 mg/dL (ref 0.0–149.0)
VLDL: 23 mg/dL (ref 0.0–40.0)

## 2017-02-04 LAB — CBC
HCT: 44.8 % (ref 36.0–46.0)
Hemoglobin: 14.7 g/dL (ref 12.0–15.0)
MCHC: 32.9 g/dL (ref 30.0–36.0)
MCV: 87.8 fl (ref 78.0–100.0)
PLATELETS: 260 10*3/uL (ref 150.0–400.0)
RBC: 5.1 Mil/uL (ref 3.87–5.11)
RDW: 13.2 % (ref 11.5–15.5)
WBC: 5.7 10*3/uL (ref 4.0–10.5)

## 2017-02-04 LAB — TSH: TSH: 0.19 u[IU]/mL — AB (ref 0.35–4.50)

## 2017-02-04 LAB — HEMOGLOBIN A1C: Hgb A1c MFr Bld: 5.5 % (ref 4.6–6.5)

## 2017-02-05 NOTE — Telephone Encounter (Signed)
-----   Message from Stacey A Blyth, MD sent at 02/04/2017  8:59 PM EDT ----- Labs look mostly good. Cholesterol mildly improved. Encouraged heart healthy diet, increase exercise, avoid trans fats, consider a krill oil cap daily. tsh is suppressed needs to continue the same dose of Levothyroxine 150 but should skip one dose a week. And then we can recheck in 2-3 months 

## 2017-02-05 NOTE — Telephone Encounter (Addendum)
Called to tell Pt that Labs look mostly good. Cholesterol mildly improved. Encouraged heart healthy diet, increase exercise, avoid trans fats, consider a krill oil cap daily. tsh is suppressed needs to continue the same dose of Levothyroxine 150 but should skip one dose a week. And then we can recheck in 2-3 months   Pt states that she will have to call abck at another time to schedule appointment, stated she was currently driving but will be in touch soon.

## 2017-02-05 NOTE — Telephone Encounter (Deleted)
-----   Message from Bradd CanaryStacey A Blyth, MD sent at 02/04/2017  8:59 PM EDT ----- Labs look mostly good. Cholesterol mildly improved. Encouraged heart healthy diet, increase exercise, avoid trans fats, consider a krill oil cap daily. tsh is suppressed needs to continue the same dose of Levothyroxine 150 but should skip one dose a week. And then we can recheck in 2-3 months

## 2017-03-06 ENCOUNTER — Other Ambulatory Visit: Payer: Self-pay

## 2017-03-06 MED ORDER — BUPROPION HCL ER (XL) 300 MG PO TB24: 300.0000 mg | ORAL_TABLET | Freq: Every day | ORAL | 1 refills | Status: DC

## 2017-03-08 ENCOUNTER — Other Ambulatory Visit: Payer: Self-pay | Admitting: Family Medicine

## 2017-03-15 ENCOUNTER — Encounter: Payer: Self-pay | Admitting: Family Medicine

## 2017-03-18 MED ORDER — BUPROPION HCL ER (XL) 300 MG PO TB24: 300.0000 mg | ORAL_TABLET | Freq: Every day | ORAL | 0 refills | Status: DC

## 2017-04-15 ENCOUNTER — Ambulatory Visit: Payer: 59 | Admitting: Pulmonary Disease

## 2017-04-25 ENCOUNTER — Ambulatory Visit: Payer: 59 | Admitting: Pulmonary Disease

## 2017-05-16 ENCOUNTER — Encounter: Payer: Self-pay | Admitting: Family Medicine

## 2017-05-16 MED ORDER — BUPROPION HCL ER (XL) 300 MG PO TB24: 300.0000 mg | ORAL_TABLET | Freq: Every day | ORAL | 0 refills | Status: DC

## 2017-07-01 ENCOUNTER — Other Ambulatory Visit: Payer: Self-pay | Admitting: Family Medicine

## 2017-07-09 ENCOUNTER — Other Ambulatory Visit: Payer: Self-pay | Admitting: Family Medicine

## 2017-07-25 ENCOUNTER — Other Ambulatory Visit: Payer: Self-pay | Admitting: Family Medicine

## 2017-07-25 ENCOUNTER — Encounter: Payer: Self-pay | Admitting: Family Medicine

## 2017-07-25 MED ORDER — ALPRAZOLAM 0.25 MG PO TABS: 0.2500 mg | ORAL_TABLET | Freq: Every day | ORAL | 0 refills | Status: DC | PRN

## 2017-10-23 ENCOUNTER — Other Ambulatory Visit: Payer: Self-pay | Admitting: Family Medicine

## 2018-01-15 ENCOUNTER — Other Ambulatory Visit: Payer: Self-pay | Admitting: Family Medicine

## 2018-01-15 DIAGNOSIS — Z1231 Encounter for screening mammogram for malignant neoplasm of breast: Secondary | ICD-10-CM

## 2018-01-16 ENCOUNTER — Encounter: Payer: Self-pay | Admitting: Family Medicine

## 2018-01-16 ENCOUNTER — Ambulatory Visit (INDEPENDENT_AMBULATORY_CARE_PROVIDER_SITE_OTHER): Payer: PRIVATE HEALTH INSURANCE | Admitting: Family Medicine

## 2018-01-16 ENCOUNTER — Ambulatory Visit (HOSPITAL_BASED_OUTPATIENT_CLINIC_OR_DEPARTMENT_OTHER)
Admission: RE | Admit: 2018-01-16 | Discharge: 2018-01-16 | Disposition: A | Discharge status: Home / Self Care | Payer: PRIVATE HEALTH INSURANCE | Source: Ambulatory Visit | Attending: Family Medicine | Admitting: Family Medicine

## 2018-01-16 VITALS — BP 98/70 | HR 98 | Temp 98.1°F | Resp 18 | Ht 64.0 in | Wt 219.4 lb

## 2018-01-16 DIAGNOSIS — Z0000 Encounter for general adult medical examination without abnormal findings: Secondary | ICD-10-CM

## 2018-01-16 DIAGNOSIS — E785 Hyperlipidemia, unspecified: Secondary | ICD-10-CM

## 2018-01-16 DIAGNOSIS — Z1231 Encounter for screening mammogram for malignant neoplasm of breast: Secondary | ICD-10-CM | POA: Diagnosis present

## 2018-01-16 DIAGNOSIS — R739 Hyperglycemia, unspecified: Secondary | ICD-10-CM | POA: Diagnosis not present

## 2018-01-16 DIAGNOSIS — E538 Deficiency of other specified B group vitamins: Secondary | ICD-10-CM

## 2018-01-16 DIAGNOSIS — F418 Other specified anxiety disorders: Secondary | ICD-10-CM

## 2018-01-16 DIAGNOSIS — E6609 Other obesity due to excess calories: Secondary | ICD-10-CM

## 2018-01-16 DIAGNOSIS — R5383 Other fatigue: Secondary | ICD-10-CM

## 2018-01-16 DIAGNOSIS — Z124 Encounter for screening for malignant neoplasm of cervix: Secondary | ICD-10-CM

## 2018-01-16 LAB — CBC
HEMATOCRIT: 42.5 % (ref 36.0–46.0)
Hemoglobin: 14.2 g/dL (ref 12.0–15.0)
MCHC: 33.3 g/dL (ref 30.0–36.0)
MCV: 88.7 fl (ref 78.0–100.0)
Platelets: 250 10*3/uL (ref 150.0–400.0)
RBC: 4.79 Mil/uL (ref 3.87–5.11)
RDW: 12.6 % (ref 11.5–15.5)
WBC: 6.6 10*3/uL (ref 4.0–10.5)

## 2018-01-16 LAB — COMPREHENSIVE METABOLIC PANEL
ALBUMIN: 4.1 g/dL (ref 3.5–5.2)
ALT: 12 U/L (ref 0–35)
AST: 11 U/L (ref 0–37)
Alkaline Phosphatase: 53 U/L (ref 39–117)
BUN: 18 mg/dL (ref 6–23)
CHLORIDE: 104 meq/L (ref 96–112)
CO2: 31 meq/L (ref 19–32)
Calcium: 9.6 mg/dL (ref 8.4–10.5)
Creatinine, Ser: 1.09 mg/dL (ref 0.40–1.20)
GFR: 56.47 mL/min — ABNORMAL LOW (ref >60.00)
Glucose, Bld: 78 mg/dL (ref 70–99)
Potassium: 4.1 mEq/L (ref 3.5–5.1)
SODIUM: 140 meq/L (ref 135–145)
Total Bilirubin: 0.6 mg/dL (ref 0.2–1.2)
Total Protein: 7.3 g/dL (ref 6.0–8.3)

## 2018-01-16 LAB — HEMOGLOBIN A1C: Hgb A1c MFr Bld: 5.2 % (ref 4.6–6.5)

## 2018-01-16 LAB — LIPID PANEL
CHOLESTEROL: 187 mg/dL (ref 0–200)
HDL: 42.2 mg/dL (ref >39.00)
LDL CALC: 128 mg/dL — AB (ref 0–99)
NonHDL: 145.18
Total CHOL/HDL Ratio: 4
Triglycerides: 88 mg/dL (ref 0.0–149.0)
VLDL: 17.6 mg/dL (ref 0.0–40.0)

## 2018-01-16 LAB — VITAMIN B12: Vitamin B-12: 373 pg/mL (ref 211–911)

## 2018-01-16 LAB — TSH: TSH: 0.96 u[IU]/mL (ref 0.35–4.50)

## 2018-01-16 LAB — VITAMIN D 25 HYDROXY (VIT D DEFICIENCY, FRACTURES): VITD: 20.94 ng/mL — ABNORMAL LOW (ref 30.00–100.00)

## 2018-01-16 MED ORDER — SERTRALINE HCL 50 MG PO TABS: 25.0000 mg | ORAL_TABLET | Freq: Every day | ORAL | 3 refills | Status: DC

## 2018-01-16 NOTE — Patient Instructions (Addendum)
Encouraged increased hydration and fiber in diet. Daily probiotics. If bowels not moving can use MOM 2 tbls po in 4 oz of warm prune juice by mouth every 2-3 days. If no results then repeat in 4 hours with  Dulcolax suppository pr, may repeat again in 4 more hours as needed. Seek care if symptoms worsen. Consider daily Miralax and/or Dulcolax if symptoms persist.  Miralax and benefiber together  Preventive Care 40-64 Years, Female Preventive care refers to lifestyle choices and visits with your health care provider that can promote health and wellness. What does preventive care include?  A yearly physical exam. This is also called an annual well check.  Dental exams once or twice a year.  Routine eye exams. Ask your health care provider how often you should have your eyes checked.  Personal lifestyle choices, including: ? Daily care of your teeth and gums. ? Regular physical activity. ? Eating a healthy diet. ? Avoiding tobacco and drug use. ? Limiting alcohol use. ? Practicing safe sex. ? Taking low-dose aspirin daily starting at age 45. ? Taking vitamin and mineral supplements as recommended by your health care provider. What happens during an annual well check? The services and screenings done by your health care provider during your annual well check will depend on your age, overall health, lifestyle risk factors, and family history of disease. Counseling Your health care provider may ask you questions about your:  Alcohol use.  Tobacco use.  Drug use.  Emotional well-being.  Home and relationship well-being.  Sexual activity.  Eating habits.  Work and work Statistician.  Method of birth control.  Menstrual cycle.  Pregnancy history.  Screening You may have the following tests or measurements:  Height, weight, and BMI.  Blood pressure.  Lipid and cholesterol levels. These may be checked every 5 years, or more frequently if you are over 8 years old.  Skin  check.  Lung cancer screening. You may have this screening every year starting at age 95 if you have a 30-pack-year history of smoking and currently smoke or have quit within the past 15 years.  Fecal occult blood test (FOBT) of the stool. You may have this test every year starting at age 40.  Flexible sigmoidoscopy or colonoscopy. You may have a sigmoidoscopy every 5 years or a colonoscopy every 10 years starting at age 58.  Hepatitis C blood test.  Hepatitis B blood test.  Sexually transmitted disease (STD) testing.  Diabetes screening. This is done by checking your blood sugar (glucose) after you have not eaten for a while (fasting). You may have this done every 1-3 years.  Mammogram. This may be done every 1-2 years. Talk to your health care provider about when you should start having regular mammograms. This may depend on whether you have a family history of breast cancer.  BRCA-related cancer screening. This may be done if you have a family history of breast, ovarian, tubal, or peritoneal cancers.  Pelvic exam and Pap test. This may be done every 3 years starting at age 23. Starting at age 61, this may be done every 5 years if you have a Pap test in combination with an HPV test.  Bone density scan. This is done to screen for osteoporosis. You may have this scan if you are at high risk for osteoporosis.  Discuss your test results, treatment options, and if necessary, the need for more tests with your health care provider. Vaccines Your health care provider may recommend certain vaccines,  such as:  Influenza vaccine. This is recommended every year.  Tetanus, diphtheria, and acellular pertussis (Tdap, Td) vaccine. You may need a Td booster every 10 years.  Varicella vaccine. You may need this if you have not been vaccinated.  Zoster vaccine. You may need this after age 89.  Measles, mumps, and rubella (MMR) vaccine. You may need at least one dose of MMR if you were born in 1957  or later. You may also need a second dose.  Pneumococcal 13-valent conjugate (PCV13) vaccine. You may need this if you have certain conditions and were not previously vaccinated.  Pneumococcal polysaccharide (PPSV23) vaccine. You may need one or two doses if you smoke cigarettes or if you have certain conditions.  Meningococcal vaccine. You may need this if you have certain conditions.  Hepatitis A vaccine. You may need this if you have certain conditions or if you travel or work in places where you may be exposed to hepatitis A.  Hepatitis B vaccine. You may need this if you have certain conditions or if you travel or work in places where you may be exposed to hepatitis B.  Haemophilus influenzae type b (Hib) vaccine. You may need this if you have certain conditions.  Talk to your health care provider about which screenings and vaccines you need and how often you need them. This information is not intended to replace advice given to you by your health care provider. Make sure you discuss any questions you have with your health care provider. Document Released: 06/09/2015 Document Revised: 01/31/2016 Document Reviewed: 03/14/2015 Elsevier Interactive Patient Education  [employer].

## 2018-01-16 NOTE — Progress Notes (Signed)
Subjective:  I acted as a Neurosurgeon for Dr. Abner Greenspan. [place], [place]  Patient ID: Tiffany Morris, female    DOB: September 20, 1965, 52 y.o.   MRN: [id number]  No chief complaint on file.   HPI  Patient is in today for an annual exam and follow up on chronic medical concerns including hyperglycemia, hyperlipidemia, hypothyroidism and depression. She is tolerating Wellbutrin but she is still struggling with anhedonia and anxiety. No suicidal ideaion. No difficulties of activities of daily living. No recent febrile illness or hospitalizations. Is trying to stay active and maintain a heart healthy diet. Denies CP/palp/SOB/HA/congestion/fevers/GI or GU c/o. Taking meds as prescribed  Patient Care Team: Bradd Canary, MD as PCP - General (Family Medicine) Genia Del, MD as Consulting Physician (Obstetrics and Gynecology) Oretha Milch, MD as Consulting Physician (Pulmonary Disease) Jeani Hawking, MD as Consulting Physician (Gastroenterology)   Past Medical History:  Diagnosis Date  . Chicken pox as a child  . Depression with anxiety 08/05/2011  . 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 Hyperglycemia 06/05/2014  . Hyperlipidemia 09/05/2011  . hypothyroidism 06/02/2010  . Obesity   . Pain of left breast 08/05/2011  . Pedal edema 08/05/2011  . Thyroid disease    hyper  . Unspecified sleep apnea 08/20/2013  . Vitamin B 12 deficiency     Past Surgical History:  Procedure Laterality Date  . CESAREAN SECTION  2004  . CHOLECYSTECTOMY     laparoscopic  . COLONOSCOPY  2014  . RETINAL DETACHMENT REPAIR W/ SCLERAL BUCKLE LE  01-25-10    Family History  Problem Relation Age of Onset  . Hyperlipidemia Mother   . Diabetes Mother        borderline  . Hyperlipidemia Father   . Hypertension Father   . Diabetes Father        type 2  . Hyperthyroidism Father   . Obesity Father   . Cancer Maternal Grandmother        lung- smoker  . COPD Maternal Grandfather        smoked  . Cancer Maternal Grandfather        bladder  . Heart disease Paternal Grandmother        CHF, MI  . Hypothyroidism Paternal Grandmother   . Hypertension Paternal Grandmother   . Hypertension Paternal Grandfather   . Heart disease Paternal Grandfather        open heart surgery  . Hyperlipidemia Paternal Grandfather   . Macular degeneration Paternal Grandfather   . Autism spectrum disorder Son     Social History   Socioeconomic History  . Marital status: Married    Spouse name: Not on file  . Number of children: Not on file  . Years of education: Not on file  . Highest education level: Not on file  Occupational History  . Occupation: NP    Employer: [employer]  Social Needs  . Financial resource strain: Not on file  . Food insecurity:    Worry: Not on file    Inability: Not on file  . Transportation needs:    Medical: Not on file    Non-medical: Not on file  Tobacco Use  . Smoking status: Never Smoker  . Smokeless tobacco: Current User  Substance and Sexual Activity  . Alcohol use: No    Alcohol/week: 0.0 standard drinks  . Drug use: No  . Sexual activity:  Not Currently    Comment: lives with husband, son age 54, no dietary restrictions.. Nurse Practiitoner with [employer]  Lifestyle  . Physical activity:    Days per week: Not on file    Minutes per session: Not on file  . Stress: Not on file  Relationships  . Social connections:    Talks on phone: Not on file    Gets together: Not on file    Attends religious service: Not on file    Active member of club or organization: Not on file    Attends meetings of clubs or organizations: Not on file    Relationship status: Not on file  . Intimate partner violence:    Fear of current or ex partner: Not on file    Emotionally abused: Not on file    Physically abused: Not on file    Forced sexual activity: Not on file  Other Topics Concern  . 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     Outpatient Medications Prior to Visit  Medication Sig Dispense Refill  . buPROPion (WELLBUTRIN XL) 300 MG 24 hr tablet Take 1 tablet (300 mg total) by mouth daily. 90 tablet 0  . levonorgestrel (MIRENA) 20 MCG/24HR IUD 1 each by Intrauterine route once.    Marland Kitchen levothyroxine (SYNTHROID, LEVOTHROID) 150 MCG tablet TAKE 1 TABLET BY MOUTH ONCE DAILY BEFORE BREAKFAST 90 tablet 0  . Omega-3 Fatty Acids (OMEGA 3 PO) Take by mouth daily.    . sertraline (ZOLOFT) 100 MG tablet Take 1 tablet (100 mg total) by mouth daily. 90 tablet 3  . ALPRAZolam (XANAX) 0.25 MG tablet Take 1 tablet (0.25 mg total) by mouth daily as needed for anxiety. 5 tablet 0  . buPROPion (WELLBUTRIN XL) 300 MG 24 hr tablet TAKE 1 TABLET BY MOUTH  DAILY 90 tablet 0   No facility-administered medications prior to visit.     No Known Allergies  Review of Systems  Constitutional: Negative for chills, fever and malaise/fatigue.  HENT: Negative for congestion and hearing loss.   Eyes: Negative for blurred vision and discharge.  Respiratory: Negative for cough, sputum production and shortness of breath.   Cardiovascular: Negative for chest pain, palpitations and leg swelling.  Gastrointestinal: Negative for abdominal pain, blood in stool, constipation, diarrhea, heartburn, nausea and vomiting.  Genitourinary: Negative for dysuria, frequency, hematuria and urgency.  Musculoskeletal: Negative for back pain, falls and myalgias.  Skin: Negative for rash.  Neurological: Negative for dizziness, sensory change, loss of consciousness, weakness and headaches.  Endo/Heme/Allergies: Negative for environmental allergies. Does not bruise/bleed easily.  Psychiatric/Behavioral: Positive for depression. Negative for suicidal ideas. The patient is not nervous/anxious and does not have insomnia.        Objective:      Physical Exam  Constitutional: She is oriented to person, place, and time. She appears well-developed and well-nourished. No distress.  HENT:  Head: Normocephalic and atraumatic.  Eyes: Conjunctivae are normal.  Neck: Neck supple. No thyromegaly present.  Cardiovascular: Normal rate, regular rhythm and normal heart sounds.  No murmur heard. Pulmonary/Chest: Effort normal and breath sounds normal. No respiratory distress.  Abdominal: Soft. Bowel sounds are normal. She exhibits no distension and no mass. There is no tenderness.  Musculoskeletal: She exhibits no edema.  Lymphadenopathy:    She has no cervical adenopathy.  Neurological: She is alert and oriented to person,  place, and time.  Skin: Skin is warm and dry.  Psychiatric: She has a normal mood and affect. Her behavior is normal.    BP 98/70 (BP Location: Left Arm, Patient Position: Sitting, Cuff Size: Normal)   Pulse 98   Temp 98.1 F (36.7 C) (Oral)   Resp 18   Ht 5\' 4"  (1.626 m)   Wt 219 lb 6.4 oz (99.5 kg)   SpO2 98%   BMI 37.66 kg/m  Wt Readings from Last 3 Encounters:  01/16/18 219 lb 6.4 oz (99.5 kg)  10/15/16 261 lb 12.8 oz (118.8 kg)  04/05/16 274 lb 2 oz (124.3 kg)   BP Readings from Last 3 Encounters:  01/16/18 98/70  10/15/16 122/70  04/05/16 118/82     Immunization History  Administered Date(s) Administered  . Influenza Split 02/24/2013  . Influenza Whole 02/25/2011  . Influenza,inj,Quad PF,6+ Mos 06/02/2014, 03/13/2015, 03/14/2016  . Tdap 05/27/2009    Health Maintenance  Topic Date Due  . PAP SMEAR  10/10/2013  . INFLUENZA VACCINE  12/25/2017  . TETANUS/TDAP  05/28/2019  . MAMMOGRAM  01/17/2020  . COLONOSCOPY  03/26/2022  . HIV Screening  Addressed    Lab Results  Component Value Date   WBC 6.6 01/16/2018   HGB 14.2 01/16/2018   HCT 42.5 01/16/2018   PLT 250.0 01/16/2018   GLUCOSE 78 01/16/2018   CHOL 187 01/16/2018   TRIG 88.0 01/16/2018   HDL 42.20 01/16/2018   LDLDIRECT 168.3  06/02/2014   LDLCALC 128 (H) 01/16/2018   ALT 12 01/16/2018   AST 11 01/16/2018   NA 140 01/16/2018   K 4.1 01/16/2018   CL 104 01/16/2018   CREATININE 1.09 01/16/2018   BUN 18 01/16/2018   CO2 31 01/16/2018   TSH 0.96 01/16/2018   HGBA1C 5.2 01/16/2018    Lab Results  Component Value Date   TSH 0.96 01/16/2018   Lab Results  Component Value Date   WBC 6.6 01/16/2018   HGB 14.2 01/16/2018   HCT 42.5 01/16/2018   MCV 88.7 01/16/2018   PLT 250.0 01/16/2018   Lab Results  Component Value Date   NA 140 01/16/2018   K 4.1 01/16/2018   CO2 31 01/16/2018   GLUCOSE 78 01/16/2018   BUN 18 01/16/2018   CREATININE 1.09 01/16/2018   BILITOT 0.6 01/16/2018   ALKPHOS 53 01/16/2018   AST 11 01/16/2018   ALT 12 01/16/2018   PROT 7.3 01/16/2018   ALBUMIN 4.1 01/16/2018   CALCIUM 9.6 01/16/2018   GFR 56.47 (L) 01/16/2018   Lab Results  Component Value Date   CHOL 187 01/16/2018   Lab Results  Component Value Date   HDL 42.20 01/16/2018   Lab Results  Component Value Date   LDLCALC 128 (H) 01/16/2018   Lab Results  Component Value Date   TRIG 88.0 01/16/2018   Lab Results  Component Value Date   CHOLHDL 4 01/16/2018   Lab Results  Component Value Date   HGBA1C 5.2 01/16/2018         Assessment & Plan:   Problem List Items Addressed This Visit    Vitamin B 12 deficiency - Primary    Supplement and monitor      Relevant Orders   Vitamin B12 (Completed)   Obesity    Encouraged DASH diet, decrease po intake and increase exercise as tolerated. Needs 7-8 hours of sleep nightly. Avoid trans fats, eat small, frequent meals every 4-5 hours with lean proteins, complex  carbs and healthy fats. Minimize simple carbs      Depression with anxiety    Still feeling stressed on Wellbutrin so will add back Sertraline at 25 mg daily and then increase to 50 mg as needed      Relevant Medications   sertraline (ZOLOFT) 50 MG tablet   Preventative health care     Patient encouraged to maintain heart healthy diet, regular exercise, adequate sleep. Consider daily probiotics. Take medications as prescribed. Last colonoscopy in 2013, due again in 2023. Agrees to return next week for shingles shot.      Relevant Orders   Hemoglobin A1c (Completed)   CBC (Completed)   Comprehensive metabolic panel (Completed)   Lipid panel (Completed)   TSH (Completed)   Hyperlipidemia    Encouraged heart healthy diet, increase exercise, avoid trans fats, consider a krill oil cap daily      Relevant Orders   Lipid panel (Completed)   TSH (Completed)   Hyperglycemia    hgba1c acceptable, minimize simple carbs. Increase exercise as tolerated.       Relevant Orders   Hemoglobin A1c (Completed)   Comprehensive metabolic panel (Completed)   TSH (Completed)    Other Visit Diagnoses    Other fatigue       Relevant Orders   CBC (Completed)   VITAMIN D 25 Hydroxy (Vit-D Deficiency, Fractures) (Completed)   Cervical cancer screening       Relevant Orders   Ambulatory referral to Obstetrics / Gynecology      I have discontinued Carma LeavenKimberly D. Meulemans "Kim"'s ALPRAZolam. I am also having her start on sertraline. Additionally, I am having her maintain her levonorgestrel, Omega-3 Fatty Acids (OMEGA 3 PO), sertraline, buPROPion, and levothyroxine.  Meds ordered this encounter  Medications  . sertraline (ZOLOFT) 50 MG tablet    Sig: Take 0.5-1 tablets (25-50 mg total) by mouth daily.    Dispense:  30 tablet    Refill:  3    CMA served as scribe during this visit. History, Physical and Plan performed by medical provider. Documentation and orders reviewed and attested to.  Danise EdgeStacey Blyth, MD

## 2018-01-16 NOTE — Assessment & Plan Note (Signed)
hgba1c acceptable, minimize simple carbs. Increase exercise as tolerated.  

## 2018-01-16 NOTE — Assessment & Plan Note (Addendum)
Patient encouraged to maintain heart healthy diet, regular exercise, adequate sleep. Consider daily probiotics. Take medications as prescribed. Last colonoscopy in 2013, due again in 2023. Agrees to return next week for shingles shot.

## 2018-01-16 NOTE — Assessment & Plan Note (Signed)
Encouraged DASH diet, decrease po intake and increase exercise as tolerated. Needs 7-8 hours of sleep nightly. Avoid trans fats, eat small, frequent meals every 4-5 hours with lean proteins, complex carbs and healthy fats. Minimize simple carbs 

## 2018-01-16 NOTE — Assessment & Plan Note (Signed)
Still feeling stressed on Wellbutrin so will add back Sertraline at 25 mg daily and then increase to 50 mg as needed

## 2018-01-16 NOTE — Assessment & Plan Note (Signed)
Encouraged heart healthy diet, increase exercise, avoid trans fats, consider a krill oil cap daily 

## 2018-01-16 NOTE — Assessment & Plan Note (Signed)
Supplement and monitor 

## 2018-01-18 ENCOUNTER — Other Ambulatory Visit: Payer: Self-pay | Admitting: Family Medicine

## 2018-01-19 MED ORDER — BUPROPION HCL ER (XL) 300 MG PO TB24: 300.0000 mg | ORAL_TABLET | Freq: Every day | ORAL | 0 refills | Status: DC

## 2018-01-19 MED ORDER — LEVOTHYROXINE SODIUM 150 MCG PO TABS: 150.0000 ug | ORAL_TABLET | Freq: Every day | ORAL | 0 refills | Status: DC

## 2018-02-06 ENCOUNTER — Encounter: Payer: Self-pay | Admitting: Family Medicine

## 2018-02-06 ENCOUNTER — Ambulatory Visit (INDEPENDENT_AMBULATORY_CARE_PROVIDER_SITE_OTHER): Payer: PRIVATE HEALTH INSURANCE | Admitting: Family Medicine

## 2018-02-06 VITALS — BP 103/69 | HR 87 | Ht 64.0 in | Wt 219.1 lb

## 2018-02-06 DIAGNOSIS — Z01419 Encounter for gynecological examination (general) (routine) without abnormal findings: Secondary | ICD-10-CM | POA: Diagnosis not present

## 2018-02-06 DIAGNOSIS — Z1151 Encounter for screening for human papillomavirus (HPV): Secondary | ICD-10-CM

## 2018-02-06 DIAGNOSIS — Z124 Encounter for screening for malignant neoplasm of cervix: Secondary | ICD-10-CM

## 2018-02-06 NOTE — Progress Notes (Signed)
GYNECOLOGY ANNUAL PREVENTATIVE CARE ENCOUNTER NOTE  Subjective:   Tiffany Morris is a 52 y.o. No obstetric history on file. female here for a routine annual gynecologic exam.  Current complaints: none.   Denies abnormal vaginal bleeding, discharge, pelvic pain, problems with intercourse or other gynecologic concerns.   No hot flashes or vaginal dryness.    Gynecologic History No LMP recorded. (Menstrual status: IUD). Patient is sexually active  Contraception: IUD - placed 5 years ago. No bleeding/periods.  Last Pap: 2014. Results were: normal Last mammogram: 12/2017. Results were: normal  Obstetric History OB History  No data available    Past Medical History:  Diagnosis Date  . Chicken pox as a child  . Depression with anxiety 08/05/2011  . 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 Hyperglycemia 06/05/2014  . Hyperlipidemia 09/05/2011  . hypothyroidism 06/02/2010  . Obesity   . Pain of left breast 08/05/2011  . Pedal edema 08/05/2011  . Thyroid disease    hyper  . Unspecified sleep apnea 08/20/2013  . Vitamin B 12 deficiency     Past Surgical History:  Procedure Laterality Date  . CESAREAN SECTION  2004  . CHOLECYSTECTOMY     laparoscopic  . COLONOSCOPY  2014  . RETINAL DETACHMENT REPAIR W/ SCLERAL BUCKLE LE  01-25-10    Current Outpatient Medications on File Prior to Visit  Medication Sig Dispense Refill  . buPROPion (WELLBUTRIN XL) 300 MG 24 hr tablet Take 1 tablet (300 mg total) by mouth daily. 90 tablet 0  . Cyanocobalamin (B-12 COMPLIANCE INJECTION) 1000 MCG/ML KIT Inject as directed.    Marland Kitchen levonorgestrel (MIRENA) 20 MCG/24HR IUD 1 each by Intrauterine route once.    Marland Kitchen levothyroxine (SYNTHROID, LEVOTHROID) 150 MCG tablet Take 1 tablet (150 mcg total) by mouth daily before breakfast. 90 tablet 0  . Multiple Vitamin (MULTIVITAMIN) tablet Take 1 tablet by mouth daily.    .  sertraline (ZOLOFT) 50 MG tablet Take 0.5-1 tablets (25-50 mg total) by mouth daily. 30 tablet 3  . Omega-3 Fatty Acids (OMEGA 3 PO) Take by mouth daily.    . sertraline (ZOLOFT) 100 MG tablet Take 1 tablet (100 mg total) by mouth daily. (Patient not taking: Reported on 02/06/2018) 90 tablet 3   No current facility-administered medications on file prior to visit.     No Known Allergies  Social History   Socioeconomic History  . Marital status: Married    Spouse name: Not on file  . Number of children: Not on file  . Years of education: Not on file  . Highest education level: Not on file  Occupational History  . 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 Financial resource strain: Not on file  . Food insecurity:    Worry: Not on file    Inability: Not on file  . Transportation needs:    Medical: Not on file    Non-medical: Not on file  Tobacco Use  . Smoking status: Never Smoker  . Smokeless tobacco: Current User  Substance and Sexual Activity  . Alcohol use: No    Alcohol/week: 0.0 standard drinks  . Drug use: No  . Sexual activity: Not Currently    Comment: lives with husband, son age 44, no dietary restrictions.. Nurse Practiitoner with [employer]  Lifestyle  . Physical activity:    Days per week: Not on file  Minutes per session: Not on file  . Stress: Not on file  Relationships  . Social connections:    Talks on phone: Not on file    Gets together: Not on file    Attends religious service: Not on file    Active member of club or organization: Not on file    Attends meetings of clubs or organizations: Not on file    Relationship status: Not on file  . Intimate partner violence:    Fear of current or ex partner: Not on file    Emotionally abused: Not on file    Physically abused: Not on file    Forced sexual activity: Not on file  Other Topics Concern  . 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     Family History  Problem Relation Age of Onset  . Hyperlipidemia Mother   . Diabetes Mother        borderline  . Hyperlipidemia Father   . Hypertension Father   . Diabetes Father        type 2  . Hyperthyroidism Father   . Obesity Father   . Cancer Maternal Grandmother        lung- smoker  . COPD Maternal Grandfather        smoked  . Cancer Maternal Grandfather        bladder  . Heart disease Paternal Grandmother        CHF, MI  . Hypothyroidism Paternal Grandmother   . Hypertension Paternal Grandmother   . Hypertension Paternal Grandfather   . Heart disease Paternal Grandfather        open heart surgery  . Hyperlipidemia Paternal Grandfather   . Macular degeneration Paternal Grandfather   . Autism spectrum disorder Son     The following portions of the patient's history were reviewed and updated as appropriate: allergies, current medications, past family history, past medical history, past social history, past surgical history and problem list.  Review of Systems Pertinent items are noted in HPI.   Objective:  BP 103/69   Pulse 87   Ht 5' 4"  (1.626 m)   Wt 219 lb 1.3 oz (99.4 kg)   BMI 37.60 kg/m  CONSTITUTIONAL: Well-developed, well-nourished female in no acute distress.  HENT:  Normocephalic, atraumatic, External right and left ear normal. Oropharynx is clear and moist EYES: Conjunctivae and EOM are normal. Pupils are equal, round, and reactive to light. No scleral icterus.  ABDOMEN: Soft, normal bowel sounds, no distention noted.  No tenderness, rebound or guarding.  PELVIC: Normal appearing external genitalia; normal appearing vaginal mucosa and cervix.  No abnormal discharge noted.  Pap smear obtained.  Normal uterine size, no other palpable masses, no uterine or adnexal tenderness. SKIN: Skin is warm and dry. No rash noted. Not diaphoretic. No erythema. No pallor. NEUROLOGIC: Alert and oriented to person, place, and  time. Normal reflexes, muscle tone coordination. No cranial nerve deficit noted. PSYCHIATRIC: Normal mood and affect. Normal behavior. Normal judgment and thought content.  Assessment:  Annual gynecologic examination with pap smear   Plan:  1. Well Woman Exam Will follow up results of pap smear and manage accordingly. Mammogram reviewed STD testing discussed. Patient declined testing  - Cytology - PAP   Routine preventative health maintenance measures emphasized. Please refer to After Visit Summary for other counseling recommendations.    Loma Boston, DO  [facility]

## 2018-02-06 NOTE — Progress Notes (Signed)
Last pap 2014

## 2018-02-06 NOTE — Patient Instructions (Signed)

## 2018-02-09 LAB — CYTOLOGY - PAP
Adequacy: ABSENT
Diagnosis: NEGATIVE
HPV: NOT DETECTED

## 2018-04-24 ENCOUNTER — Ambulatory Visit: Payer: PRIVATE HEALTH INSURANCE | Admitting: Family Medicine

## 2018-05-11 ENCOUNTER — Ambulatory Visit (INDEPENDENT_AMBULATORY_CARE_PROVIDER_SITE_OTHER): Payer: PRIVATE HEALTH INSURANCE | Admitting: Family Medicine

## 2018-05-11 DIAGNOSIS — E538 Deficiency of other specified B group vitamins: Secondary | ICD-10-CM

## 2018-05-11 DIAGNOSIS — Z23 Encounter for immunization: Secondary | ICD-10-CM

## 2018-05-11 DIAGNOSIS — E559 Vitamin D deficiency, unspecified: Secondary | ICD-10-CM

## 2018-05-11 DIAGNOSIS — E6609 Other obesity due to excess calories: Secondary | ICD-10-CM

## 2018-05-11 DIAGNOSIS — R739 Hyperglycemia, unspecified: Secondary | ICD-10-CM

## 2018-05-11 DIAGNOSIS — E785 Hyperlipidemia, unspecified: Secondary | ICD-10-CM

## 2018-05-11 LAB — LIPID PANEL
Cholesterol: 234 mg/dL — ABNORMAL HIGH (ref 0–200)
HDL: 52.2 mg/dL (ref >39.00)
LDL Cholesterol: 156 mg/dL — ABNORMAL HIGH (ref 0–99)
NonHDL: 181.8
Total CHOL/HDL Ratio: 4
Triglycerides: 128 mg/dL (ref 0.0–149.0)
VLDL: 25.6 mg/dL (ref 0.0–40.0)

## 2018-05-11 LAB — COMPREHENSIVE METABOLIC PANEL
ALT: 17 U/L (ref 0–35)
AST: 14 U/L (ref 0–37)
Albumin: 3.9 g/dL (ref 3.5–5.2)
Alkaline Phosphatase: 51 U/L (ref 39–117)
BUN: 14 mg/dL (ref 6–23)
CO2: 29 mEq/L (ref 19–32)
Calcium: 9.5 mg/dL (ref 8.4–10.5)
Chloride: 105 mEq/L (ref 96–112)
Creatinine, Ser: 0.88 mg/dL (ref 0.40–1.20)
GFR: 72.2 mL/min (ref >60.00)
Glucose, Bld: 92 mg/dL (ref 70–99)
Potassium: 4.5 mEq/L (ref 3.5–5.1)
Sodium: 141 mEq/L (ref 135–145)
Total Bilirubin: 0.4 mg/dL (ref 0.2–1.2)
Total Protein: 6.8 g/dL (ref 6.0–8.3)

## 2018-05-11 LAB — CBC
HCT: 43.4 % (ref 36.0–46.0)
Hemoglobin: 14.5 g/dL (ref 12.0–15.0)
MCHC: 33.4 g/dL (ref 30.0–36.0)
MCV: 88.4 fl (ref 78.0–100.0)
Platelets: 261 10*3/uL (ref 150.0–400.0)
RBC: 4.91 Mil/uL (ref 3.87–5.11)
RDW: 13.2 % (ref 11.5–15.5)
WBC: 7.2 10*3/uL (ref 4.0–10.5)

## 2018-05-11 LAB — HEMOGLOBIN A1C: Hgb A1c MFr Bld: 5.5 % (ref 4.6–6.5)

## 2018-05-11 LAB — TSH: TSH: 8.1 u[IU]/mL — AB (ref 0.35–4.50)

## 2018-05-11 LAB — VITAMIN B12: VITAMIN B 12: 185 pg/mL — AB (ref 211–911)

## 2018-05-11 LAB — VITAMIN D 25 HYDROXY (VIT D DEFICIENCY, FRACTURES): VITD: 15.94 ng/mL — AB (ref 30.00–100.00)

## 2018-05-11 NOTE — Assessment & Plan Note (Signed)
Not taking a supplement will check today

## 2018-05-11 NOTE — Assessment & Plan Note (Signed)
Encouraged DASH diet, decrease po intake and increase exercise as tolerated. Needs 7-8 hours of sleep nightly. Avoid trans fats, eat small, frequent meals every 4-5 hours with lean proteins, complex carbs and healthy fats. Minimize simple carbs, GMO foods. 

## 2018-05-11 NOTE — Patient Instructions (Addendum)
Shingrix is the new shingles shot 2 shots over 2 -6 months, call insurance and confirm coverage, document then call for nurse visit for shots  DASH Eating Plan DASH stands for "Dietary Approaches to Stop Hypertension." The DASH eating plan is a healthy eating plan that has been shown to reduce high blood pressure (hypertension). It may also reduce your risk for type 2 diabetes, heart disease, and stroke. The DASH eating plan may also help with weight loss. What are tips for following this plan? General guidelines  Avoid eating more than 2,300 mg (milligrams) of salt (sodium) a day. If you have hypertension, you may need to reduce your sodium intake to 1,500 mg a day.  Limit alcohol intake to no more than 1 drink a day for nonpregnant women and 2 drinks a day for men. One drink equals 12 oz of beer, 5 oz of wine, or 1 oz of hard liquor.  Work with your health care provider to maintain a healthy body weight or to lose weight. Ask what an ideal weight is for you.  Get at least 30 minutes of exercise that causes your heart to beat faster (aerobic exercise) most days of the week. Activities may include walking, swimming, or biking.  Work with your health care provider or diet and nutrition specialist (dietitian) to adjust your eating plan to your individual calorie needs. Reading food labels  Check food labels for the amount of sodium per serving. Choose foods with less than 5 percent of the Daily Value of sodium. Generally, foods with less than 300 mg of sodium per serving fit into this eating plan.  To find whole grains, look for the word "whole" as the first word in the ingredient list. Shopping  Buy products labeled as "low-sodium" or "no salt added."  Buy fresh foods. Avoid canned foods and premade or frozen meals. Cooking  Avoid adding salt when cooking. Use salt-free seasonings or herbs instead of table salt or sea salt. Check with your health care provider or pharmacist before using  salt substitutes.  Do not fry foods. Cook foods using healthy methods such as baking, boiling, grilling, and broiling instead.  Cook with heart-healthy oils, such as olive, canola, soybean, or sunflower oil. Meal planning   Eat a balanced diet that includes: ? 5 or more servings of fruits and vegetables each day. At each meal, try to fill half of your plate with fruits and vegetables. ? Up to 6-8 servings of whole grains each day. ? Less than 6 oz of lean meat, poultry, or fish each day. A 3-oz serving of meat is about the same size as a deck of cards. One egg equals 1 oz. ? 2 servings of low-fat dairy each day. ? A serving of nuts, seeds, or beans 5 times each week. ? Heart-healthy fats. Healthy fats called Omega-3 fatty acids are found in foods such as flaxseeds and coldwater fish, like sardines, salmon, and mackerel.  Limit how much you eat of the following: ? Canned or prepackaged foods. ? Food that is high in trans fat, such as fried foods. ? Food that is high in saturated fat, such as fatty meat. ? Sweets, desserts, sugary drinks, and other foods with added sugar. ? Full-fat dairy products.  Do not salt foods before eating.  Try to eat at least 2 vegetarian meals each week.  Eat more home-cooked food and less restaurant, buffet, and fast food.  When eating at a restaurant, ask that your food be prepared with  less salt or no salt, if possible. What foods are recommended? The items listed may not be a complete list. Talk with your dietitian about what dietary choices are best for you. Grains Whole-grain or whole-wheat bread. Whole-grain or whole-wheat pasta. Brown rice. [name]. Bulgur. Whole-grain and low-sodium cereals. Pita bread. Low-fat, low-sodium crackers. Whole-wheat flour tortillas. Vegetables Fresh or frozen vegetables (raw, steamed, roasted, or grilled). Low-sodium or reduced-sodium tomato and vegetable juice. Low-sodium or reduced-sodium tomato sauce and  tomato paste. Low-sodium or reduced-sodium canned vegetables. Fruits All fresh, dried, or frozen fruit. Canned fruit in natural juice (without added sugar). Meat and other protein foods Skinless chicken or [place]. Ground chicken or [place]. Pork with fat trimmed off. Fish and seafood. Egg whites. Dried beans, peas, or lentils. Unsalted nuts, nut butters, and seeds. Unsalted canned beans. Lean cuts of beef with fat trimmed off. Low-sodium, lean deli meat. Dairy Low-fat (1%) or fat-free (skim) milk. Fat-free, low-fat, or reduced-fat cheeses. Nonfat, low-sodium ricotta or cottage cheese. Low-fat or nonfat yogurt. Low-fat, low-sodium cheese. Fats and oils Soft margarine without trans fats. Vegetable oil. Low-fat, reduced-fat, or light mayonnaise and salad dressings (reduced-sodium). Canola, safflower, olive, soybean, and sunflower oils. Avocado. Seasoning and other foods Herbs. Spices. Seasoning mixes without salt. Unsalted popcorn and pretzels. Fat-free sweets. What foods are not recommended? The items listed may not be a complete list. Talk with your dietitian about what dietary choices are best for you. Grains Baked goods made with fat, such as croissants, muffins, or some breads. Dry pasta or rice meal packs. Vegetables Creamed or fried vegetables. Vegetables in a cheese sauce. Regular canned vegetables (not low-sodium or reduced-sodium). Regular canned tomato sauce and paste (not low-sodium or reduced-sodium). Regular tomato and vegetable juice (not low-sodium or reduced-sodium). [name]. Olives. Fruits Canned fruit in a light or heavy syrup. Fried fruit. Fruit in cream or butter sauce. Meat and other protein foods Fatty cuts of meat. Ribs. Fried meat. [name]. Sausage. Bologna and other processed lunch meats. Salami. Fatback. Hotdogs. Bratwurst. Salted nuts and seeds. Canned beans with added salt. Canned or smoked fish. Whole eggs or egg yolks. Chicken or [place] with skin. Dairy Whole or 2%  milk, cream, and half-and-half. Whole or full-fat cream cheese. Whole-fat or sweetened yogurt. Full-fat cheese. Nondairy creamers. Whipped toppings. Processed cheese and cheese spreads. Fats and oils Butter. Stick margarine. Lard. Shortening. Ghee. Bacon fat. Tropical oils, such as coconut, palm kernel, or palm oil. Seasoning and other foods Salted popcorn and pretzels. Onion salt, garlic salt, seasoned salt, table salt, and sea salt. Worcestershire sauce. Tartar sauce. Barbecue sauce. Teriyaki sauce. Soy sauce, including reduced-sodium. Steak sauce. Canned and packaged gravies. Fish sauce. Oyster sauce. Cocktail sauce. Horseradish that you find on the shelf. Ketchup. Mustard. Meat flavorings and tenderizers. Bouillon cubes. Hot sauce and Tabasco sauce. Premade or packaged marinades. Premade or packaged taco seasonings. Relishes. Regular salad dressings. Where to find more information:  National Heart, Lung, and [place]: https://wilson-eaton.com/  American Heart Association: www.heart.org Summary  The DASH eating plan is a healthy eating plan that has been shown to reduce high blood pressure (hypertension). It may also reduce your risk for type 2 diabetes, heart disease, and stroke.  With the DASH eating plan, you should limit salt (sodium) intake to 2,300 mg a day. If you have hypertension, you may need to reduce your sodium intake to 1,500 mg a day.  When on the DASH eating plan, aim to eat more fresh fruits and vegetables, whole grains, lean proteins,  low-fat dairy, and heart-healthy fats.  Work with your health care provider or diet and nutrition specialist (dietitian) to adjust your eating plan to your individual calorie needs. This information is not intended to replace advice given to you by your health care provider. Make sure you discuss any questions you have with your health care provider. Document Released: 05/02/2011 Document Revised: 05/06/2016 Document Reviewed:  05/06/2016 Elsevier Interactive Patient Education  [employer].

## 2018-05-11 NOTE — Assessment & Plan Note (Signed)
Encouraged heart healthy diet, increase exercise, avoid trans fats, consider a krill oil cap daily 

## 2018-05-11 NOTE — Assessment & Plan Note (Signed)
Supplement and montor 

## 2018-05-11 NOTE — Progress Notes (Signed)
Subjective:    Patient ID: Tiffany Morris, female    DOB: 1965/08/24, 52 y.o.   MRN: [id number]  No chief complaint on file.   HPI Patient is in today for follow up. He does note recent febrile or hospitalizations. No polyuria or pollydisia has some ongoing back pain. Denies CP/palp/SOB/HA/congestion/fevers/GI or GU c/o. Taking meds as prescribed  Past Medical History:  Diagnosis Date  . Chicken pox as a child  . Depression with anxiety 08/05/2011  . 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 Hyperglycemia 06/05/2014  . Hyperlipidemia 09/05/2011  . hypothyroidism 06/02/2010  . Obesity   . Pain of left breast 08/05/2011  . Pedal edema 08/05/2011  . Thyroid disease    hyper  . Unspecified sleep apnea 08/20/2013  . Vitamin B 12 deficiency     Past Surgical History:  Procedure Laterality Date  . CESAREAN SECTION  2004  . CHOLECYSTECTOMY     laparoscopic  . COLONOSCOPY  2014  . RETINAL DETACHMENT REPAIR W/ SCLERAL BUCKLE LE  01-25-10    Family History  Problem Relation Age of Onset  . Hyperlipidemia Mother   . Diabetes Mother        borderline  . Hyperlipidemia Father   . Hypertension Father   . Diabetes Father        type 2  . Hyperthyroidism Father   . Obesity Father   . Cancer Maternal Grandmother        lung- smoker  . COPD Maternal Grandfather        smoked  . Cancer Maternal Grandfather        bladder  . Heart disease Paternal Grandmother        CHF, MI  . Hypothyroidism Paternal Grandmother   . Hypertension Paternal Grandmother   . Hypertension Paternal Grandfather   . Heart disease Paternal Grandfather        open heart surgery  . Hyperlipidemia Paternal Grandfather   . Macular degeneration Paternal Grandfather   . Autism spectrum disorder Son     Social History   Socioeconomic History  . Marital status: Married    Spouse name: Not on file  . Number of children: Not  on file  . Years of education: Not on file  . Highest education level: Not on file  Occupational History  . 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 Financial resource strain: Not on file  . Food insecurity:    Worry: Not on file    Inability: Not on file  . Transportation needs:    Medical: Not on file    Non-medical: Not on file  Tobacco Use  . Smoking status: Never Smoker  . Smokeless tobacco: Current User  Substance and Sexual Activity  . Alcohol use: No    Alcohol/week: 0.0 standard drinks  . Drug use: No  . Sexual activity: Not Currently    Comment: lives with husband, son age 19, no dietary restrictions.. Nurse Practiitoner with [employer]  Lifestyle  . Physical activity:    Days per week: Not on file    Minutes per session: Not on file  . Stress: Not on file  Relationships  . Social connections:    Talks on phone: Not on file    Gets together: Not on file    Attends religious service: Not on file    Active member of  club or organization: Not on file    Attends meetings of clubs or organizations: Not on file    Relationship status: Not on file  . Intimate partner violence:    Fear of current or ex partner: Not on file    Emotionally abused: Not on file    Physically abused: Not on file    Forced sexual activity: Not on file  Other Topics Concern  . 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     Outpatient Medications Prior to Visit  Medication Sig Dispense Refill  . buPROPion (WELLBUTRIN XL) 300 MG 24 hr tablet Take 1 tablet (300 mg total) by mouth daily. 90 tablet 0  . Cyanocobalamin (B-12 COMPLIANCE INJECTION) 1000 MCG/ML KIT Inject as directed. Monthly    . levonorgestrel (MIRENA) 20 MCG/24HR IUD 1 each by Intrauterine route once.    Marland Kitchen levothyroxine (SYNTHROID, LEVOTHROID) 150 MCG tablet Take 1 tablet (150 mcg total) by mouth daily before breakfast.  (Patient taking differently: Take 150 mcg by mouth daily before breakfast. Monday-Saturday) 90 tablet 0  . Omega-3 Fatty Acids (OMEGA 3 PO) Take by mouth daily.    . Multiple Vitamin (MULTIVITAMIN) tablet Take 1 tablet by mouth daily.    . sertraline (ZOLOFT) 100 MG tablet Take 1 tablet (100 mg total) by mouth daily. 90 tablet 3  . sertraline (ZOLOFT) 50 MG tablet Take 0.5-1 tablets (25-50 mg total) by mouth daily. 30 tablet 3   No facility-administered medications prior to visit.     No Known Allergies  Review of Systems  Constitutional: Negative for fever and malaise/fatigue.  HENT: Negative for congestion.   Eyes: Negative for blurred vision.  Respiratory: Negative for shortness of breath.   Cardiovascular: Negative for chest pain, palpitations and leg swelling.  Gastrointestinal: Negative for abdominal pain, blood in stool and nausea.  Genitourinary: Negative for dysuria and frequency.  Musculoskeletal: Negative for falls.  Skin: Negative for rash.  Neurological: Negative for dizziness, loss of consciousness and headaches.  Endo/Heme/Allergies: Negative for environmental allergies.  Psychiatric/Behavioral: Negative for depression. The patient is nervous/anxious.        Objective:    Physical Exam Vitals signs and nursing note reviewed.  Constitutional:      General: She is not in acute distress.    Appearance: She is well-developed.  HENT:     Head: Normocephalic and atraumatic.     Nose: Nose normal.  Eyes:     General:        Right eye: No discharge.        Left eye: No discharge.  Neck:     Musculoskeletal: Normal range of motion and neck supple.  Cardiovascular:     Rate and Rhythm: Normal rate and regular rhythm.     Heart sounds: No murmur.  Pulmonary:     Effort: Pulmonary effort is normal.     Breath sounds: Normal breath sounds.  Abdominal:     General: Bowel sounds are normal.     Palpations: Abdomen is soft.     Tenderness: There is no abdominal  tenderness.  Skin:    General: Skin is warm and dry.  Neurological:     Mental Status: She is alert and oriented to person, place, and time.     BP 112/78 (BP Location: Left Arm, Patient Position: Sitting, Cuff Size: Normal)   Pulse 82  Temp 98.2 F (36.8 C) (Oral)   Resp 18   Ht 5' 4"  (1.626 m)   Wt 242 lb (109.8 kg)   SpO2 96%   BMI 41.54 kg/m  Wt Readings from Last 3 Encounters:  05/11/18 242 lb (109.8 kg)  02/06/18 219 lb 1.3 oz (99.4 kg)  01/16/18 219 lb 6.4 oz (99.5 kg)     Lab Results  Component Value Date   WBC 7.2 05/11/2018   HGB 14.5 05/11/2018   HCT 43.4 05/11/2018   PLT 261.0 05/11/2018   GLUCOSE 92 05/11/2018   CHOL 234 (H) 05/11/2018   TRIG 128.0 05/11/2018   HDL 52.20 05/11/2018   LDLDIRECT 168.3 06/02/2014   LDLCALC 156 (H) 05/11/2018   ALT 17 05/11/2018   AST 14 05/11/2018   NA 141 05/11/2018   K 4.5 05/11/2018   CL 105 05/11/2018   CREATININE 0.88 05/11/2018   BUN 14 05/11/2018   CO2 29 05/11/2018   TSH 8.10 (H) 05/11/2018   HGBA1C 5.5 05/11/2018    Lab Results  Component Value Date   TSH 8.10 (H) 05/11/2018   Lab Results  Component Value Date   WBC 7.2 05/11/2018   HGB 14.5 05/11/2018   HCT 43.4 05/11/2018   MCV 88.4 05/11/2018   PLT 261.0 05/11/2018   Lab Results  Component Value Date   NA 141 05/11/2018   K 4.5 05/11/2018   CO2 29 05/11/2018   GLUCOSE 92 05/11/2018   BUN 14 05/11/2018   CREATININE 0.88 05/11/2018   BILITOT 0.4 05/11/2018   ALKPHOS 51 05/11/2018   AST 14 05/11/2018   ALT 17 05/11/2018   PROT 6.8 05/11/2018   ALBUMIN 3.9 05/11/2018   CALCIUM 9.5 05/11/2018   GFR 72.20 05/11/2018   Lab Results  Component Value Date   CHOL 234 (H) 05/11/2018   Lab Results  Component Value Date   HDL 52.20 05/11/2018   Lab Results  Component Value Date   LDLCALC 156 (H) 05/11/2018   Lab Results  Component Value Date   TRIG 128.0 05/11/2018   Lab Results  Component Value Date   CHOLHDL 4 05/11/2018    Lab Results  Component Value Date   HGBA1C 5.5 05/11/2018       Assessment & Plan:   Problem List Items Addressed This Visit    Vitamin B 12 deficiency    Supplement and montor      Relevant Orders   CBC (Completed)   Vitamin B12 (Completed)   Obesity    Encouraged DASH diet, decrease po intake and increase exercise as tolerated. Needs 7-8 hours of sleep nightly. Avoid trans fats, eat small, frequent meals every 4-5 hours with lean proteins, complex carbs and healthy fats. Minimize simple carbs, GMO foods.      Relevant Orders   TSH (Completed)   Hyperlipidemia    Encouraged heart healthy diet, increase exercise, avoid trans fats, consider a krill oil cap daily      Relevant Orders   Lipid panel (Completed)   TSH (Completed)   Hyperglycemia   Relevant Orders   Hemoglobin A1c (Completed)   Comprehensive metabolic panel (Completed)   TSH (Completed)   Vitamin D deficiency    Not taking a supplement will check today      Relevant Orders   VITAMIN D 25 Hydroxy (Vit-D Deficiency, Fractures) (Completed)      I have discontinued Budd Palmer "Kim"'s sertraline, sertraline, and multivitamin. I am also having her maintain her levonorgestrel,  Omega-3 Fatty Acids (OMEGA 3 PO), buPROPion, levothyroxine, and Cyanocobalamin.  No orders of the defined types were placed in this encounter.    Penni Homans, MD

## 2018-05-12 ENCOUNTER — Other Ambulatory Visit (INDEPENDENT_AMBULATORY_CARE_PROVIDER_SITE_OTHER): Payer: PRIVATE HEALTH INSURANCE

## 2018-05-12 ENCOUNTER — Other Ambulatory Visit: Payer: Self-pay | Admitting: Emergency Medicine

## 2018-05-12 DIAGNOSIS — E538 Deficiency of other specified B group vitamins: Secondary | ICD-10-CM

## 2018-05-12 DIAGNOSIS — R7989 Other specified abnormal findings of blood chemistry: Secondary | ICD-10-CM

## 2018-05-12 LAB — T4, FREE: FREE T4: 0.8 ng/dL (ref 0.60–1.60)

## 2018-05-14 LAB — INTRINSIC FACTOR ANTIBODIES: Intrinsic Factor: UNDETERMINED — AB

## 2018-05-16 ENCOUNTER — Other Ambulatory Visit: Payer: Self-pay | Admitting: Family Medicine

## 2018-05-16 ENCOUNTER — Encounter: Payer: Self-pay | Admitting: Family Medicine

## 2018-05-18 MED ORDER — LEVOTHYROXINE SODIUM 150 MCG PO TABS: 150.0000 ug | ORAL_TABLET | Freq: Every day | ORAL | 1 refills | Status: DC

## 2018-05-18 MED ORDER — BUPROPION HCL ER (XL) 300 MG PO TB24: 300.0000 mg | ORAL_TABLET | Freq: Every day | ORAL | 1 refills | Status: DC

## 2018-05-19 MED ORDER — VITAMIN D (ERGOCALCIFEROL) 1.25 MG (50000 UNIT) PO CAPS: 50000.0000 [IU] | ORAL_CAPSULE | ORAL | 4 refills | Status: DC

## 2018-08-17 ENCOUNTER — Ambulatory Visit: Payer: PRIVATE HEALTH INSURANCE | Admitting: Family Medicine

## 2018-09-19 DIAGNOSIS — G4733 Obstructive sleep apnea (adult) (pediatric): Secondary | ICD-10-CM | POA: Diagnosis not present

## 2018-12-07 ENCOUNTER — Other Ambulatory Visit: Payer: Self-pay | Admitting: Family Medicine

## 2018-12-23 DIAGNOSIS — G4733 Obstructive sleep apnea (adult) (pediatric): Secondary | ICD-10-CM | POA: Diagnosis not present

## 2019-01-12 NOTE — Progress Notes (Addendum)
[facility] [place], [place], [place] [address] [phone]  Date:  01/14/2019   Name:  Tiffany Morris   DOB:  12-Aug-1965   MRN:  [id number]  PCP:  Mosie Lukes, MD    Chief Complaint: Numbness (right numbness in toes, wont go away after three weeks, pain on bottom of foot)   History of Present Illness:  Tiffany Morris is a 53 y.o. very pleasant female patient who presents with the following:  Patient is typically sees Dr. Charlett Blake, here today to discuss numbness of her right toes She does have history of B12 deficiency-she was low at last check in December She was also vitamin D deficient at that time She has noted some numbness in the toes of the right foot for about 3 weeks- in the 3rd/4th and 5th toes only Seemed to start all of a sudden after she washed dishes Left foot is ok No neuro sx otherwise On unusual headaches  No change in her activity level or shoes Numbness is constant but may wax and wane Not painful  Kind of feels like something is stuck to the bottom of her foot   Wellbutrin B12 injections Euthyrox Mirena IUD Vitamin D  She is not fasting today   She is a non-smoker, 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 Vitamin D deficiency 05/11/2018  . Hyperglycemia 06/05/2014  . OSA (obstructive sleep apnea) 09/22/2013  . Hyperlipidemia 09/05/2011  . Depression with anxiety 08/05/2011  . Preventative health care 08/05/2011  . Pedal edema 08/05/2011  . Vitamin B 12 deficiency   . Obesity   . H/O thyrotoxicosis 06/02/2010    Past Medical History:  Diagnosis Date  . Chicken pox as a child  . Depression with anxiety 08/05/2011  . 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 Hyperglycemia 06/05/2014  . Hyperlipidemia 09/05/2011  .  hypothyroidism 06/02/2010  . Obesity   . Pain of left breast 08/05/2011  . Pedal edema 08/05/2011  . Thyroid disease    hyper  . Unspecified sleep apnea 08/20/2013  . Vitamin B 12 deficiency     Past Surgical History:  Procedure Laterality Date  . CESAREAN SECTION  2004  . CHOLECYSTECTOMY     laparoscopic  . COLONOSCOPY  2014  . RETINAL DETACHMENT REPAIR W/ SCLERAL BUCKLE LE  01-25-10    Social History   Tobacco Use  . Smoking status: Never Smoker  . Smokeless tobacco: Current User  Substance Use Topics  . Alcohol use: No    Alcohol/week: 0.0 standard drinks  . Drug use: No    Family History  Problem Relation Age of Onset  . Hyperlipidemia Mother   . Diabetes Mother        borderline  . Hyperlipidemia Father   . Hypertension Father   . Diabetes Father        type 2  . Hyperthyroidism Father   . Obesity Father   . Cancer Maternal Grandmother        lung- smoker  . COPD Maternal Grandfather        smoked  . Cancer Maternal Grandfather        bladder  . Heart disease Paternal Grandmother        CHF, MI  . Hypothyroidism  Paternal Grandmother   . Hypertension Paternal Grandmother   . Hypertension Paternal Grandfather   . Heart disease Paternal Grandfather        open heart surgery  . Hyperlipidemia Paternal Grandfather   . Macular degeneration Paternal Grandfather   . Autism spectrum disorder Son     No Known Allergies  Medication list has been reviewed and updated.  Current Outpatient Medications on File Prior to Visit  Medication Sig Dispense Refill  . B-D 3CC LUER-LOK SYR 25GX1" 25G X 1" 3 ML MISC USE AS DIRECTED 6 each 0  . Biotin 5000 MCG CAPS Take by mouth.    Marland Kitchen. buPROPion (WELLBUTRIN XL) 300 MG 24 hr tablet Take 1 tablet by mouth once daily 90 tablet 0  . cyanocobalamin (,VITAMIN B-12,) 1000 MCG/ML injection INJECT 1 ML INTRAMUSCULARLY ONCE 10 mL 0  . EUTHYROX 150 MCG tablet TAKE 1 TABLET BY MOUTH ONCE DAILY BEFORE BREAKFAST 90 tablet 0  .  levonorgestrel (MIRENA) 20 MCG/24HR IUD 1 each by Intrauterine route once.    . Omega-3 Fatty Acids (OMEGA 3 PO) Take by mouth daily.    . Vitamin D, Ergocalciferol, (DRISDOL) 1.25 MG (50000 UT) CAPS capsule Take 1 capsule (50,000 Units total) by mouth every 7 (seven) days. 4 capsule 4   No current facility-administered medications on file prior to visit.     Review of Systems:  As per HPI- otherwise negative. No fever or chills  Physical Examination: Vitals:   01/14/19 1029  BP: 122/82  Pulse: 100  Resp: 16  Temp: (!) 97.2 F (36.2 C)  SpO2: 97%   Vitals:   01/14/19 1029  Weight: 246 lb (111.6 kg)  Height: 5\' 4"  (1.626 m)   Body mass index is 42.23 kg/m. Ideal Body Weight: Weight in (lb) to have BMI = 25: 145.3  GEN: WDWN, NAD, Non-toxic, A & O x 3, obese, looks well  HEENT: Atraumatic, Normocephalic. Neck supple. No masses, No LAD. Ears and Nose: No external deformity. CV: RRR, No M/G/R. No JVD. No thrill. No extra heart sounds. PULM: CTA B, no wheezes, crackles, rhonchi. No retractions. No resp. distress. No accessory muscle use. EXTR: No c/c/e NEURO Normal gait.  Normal strength, sensation, DTR of all extremities.  Normal range of motion of all extremities.  Normal facial motion and sensation.  Normal Romberg and tandem stance testing PSYCH: Normally interactive. Conversant. Not depressed or anxious appearing.  Calm demeanor.  Foot exam: Normal pulses bilaterally, toes are warm and well-perfused.  No swelling, bruising, deformity She notes blunted sensation to monofilamant in the RIGHT 3rd/4th/5th toes only Otherwise monofilament testing is normal The right foot is not particularly tender, but she does note some discomfort with pressure on the distal third through fifth metatarsals  Assessment and Plan:   ICD-10-CM   1. Numbness of toes  R20.0 CBC    Comprehensive metabolic panel    B12    TSH    Folate    Ambulatory referral to Sports Medicine  2. Low vitamin  B12 level  E53.8 B12  3. Abnormal TSH  R79.89 TSH  4. Vitamin D deficiency  E55.9 Vitamin D (25 hydroxy)   Here today with concern of numbness in her right third through fifth toes for about 3 weeks.  Otherwise she is feeling well, no other symptoms I suspect this is a benign musculoskeletal issue, may be a Morton's neuroma. Labs are pain as above to look for any other potential cause.  Will refer to  sports medicine for evaluation  Follow-up: No follow-ups on file.  No orders of the defined types were placed in this encounter.  Orders Placed This Encounter  Procedures  . CBC  . Comprehensive metabolic panel  . B12  . Vitamin D (25 hydroxy)  . TSH  . Folate  . Ambulatory referral to Sports Medicine    @SIGN @    Signed Abbe AmsterdamJessica Shante Archambeault, MD Received her labs, message to pt  Results for orders placed or performed in visit on 01/14/19  CBC  Result Value Ref Range   WBC 6.0 4.0 - 10.5 K/uL   RBC 4.79 3.87 - 5.11 Mil/uL   Platelets 252.0 150.0 - 400.0 K/uL   Hemoglobin 13.9 12.0 - 15.0 g/dL   HCT 45.441.9 09.836.0 - 11.946.0 %   MCV 87.4 78.0 - 100.0 fl   MCHC 33.3 30.0 - 36.0 g/dL   RDW 14.712.5 82.911.5 - 56.215.5 %  Comprehensive metabolic panel  Result Value Ref Range   Sodium 141 135 - 145 mEq/L   Potassium 4.0 3.5 - 5.1 mEq/L   Chloride 106 96 - 112 mEq/L   CO2 27 19 - 32 mEq/L   Glucose, Bld 108 (H) 70 - 99 mg/dL   BUN 12 6 - 23 mg/dL   Creatinine, Ser 1.300.90 0.40 - 1.20 mg/dL   Total Bilirubin 0.5 0.2 - 1.2 mg/dL   Alkaline Phosphatase 54 39 - 117 U/L   AST 18 0 - 37 U/L   ALT 21 0 - 35 U/L   Total Protein 6.9 6.0 - 8.3 g/dL   Albumin 4.1 3.5 - 5.2 g/dL   Calcium 9.1 8.4 - 86.510.5 mg/dL   GFR 78.4666.01 >96.29>60.00 mL/min  B12  Result Value Ref Range   Vitamin B-12 276 211 - 911 pg/mL  Vitamin D (25 hydroxy)  Result Value Ref Range   VITD 41.00 30.00 - 100.00 ng/mL  TSH  Result Value Ref Range   TSH 0.06 (L) 0.35 - 4.50 uIU/mL  Folate  Result Value Ref Range   Folate 16.5 >5.9 ng/mL     Blood counts are normal Metabolic profile looks fine Vitamin B12 is in normal range, vitamin D and folate also okay  Your TSH is low, this means you are likely on too much thyroid medication.   I believe you are currently on Euthyrox 150 mcg-I would like to reduce this to 100 or 125 mcg.  Do you happen to have any other strengths on hand at home?  If not I will call something in  Once we have adjusted your dose, will want to recheck a TSH in about 1 month It is possible that this thyroid issue is contributing to your toe numbness.  However, there is probably another cause-let us go ahead with having you see sports med

## 2019-01-14 ENCOUNTER — Other Ambulatory Visit: Payer: Self-pay

## 2019-01-14 ENCOUNTER — Ambulatory Visit (INDEPENDENT_AMBULATORY_CARE_PROVIDER_SITE_OTHER): Payer: BC Managed Care – PPO | Admitting: Family Medicine

## 2019-01-14 ENCOUNTER — Encounter: Payer: Self-pay | Admitting: Family Medicine

## 2019-01-14 VITALS — BP 122/82 | HR 70 | Temp 97.2°F | Resp 16 | Ht 64.0 in | Wt 246.0 lb

## 2019-01-14 DIAGNOSIS — R200 Anesthesia of skin: Secondary | ICD-10-CM

## 2019-01-14 DIAGNOSIS — E559 Vitamin D deficiency, unspecified: Secondary | ICD-10-CM

## 2019-01-14 DIAGNOSIS — E538 Deficiency of other specified B group vitamins: Secondary | ICD-10-CM

## 2019-01-14 DIAGNOSIS — R7989 Other specified abnormal findings of blood chemistry: Secondary | ICD-10-CM | POA: Diagnosis not present

## 2019-01-14 DIAGNOSIS — H43811 Vitreous degeneration, right eye: Secondary | ICD-10-CM | POA: Diagnosis not present

## 2019-01-14 DIAGNOSIS — H35411 Lattice degeneration of retina, right eye: Secondary | ICD-10-CM | POA: Diagnosis not present

## 2019-01-14 DIAGNOSIS — H2513 Age-related nuclear cataract, bilateral: Secondary | ICD-10-CM | POA: Diagnosis not present

## 2019-01-14 DIAGNOSIS — H524 Presbyopia: Secondary | ICD-10-CM | POA: Diagnosis not present

## 2019-01-14 LAB — COMPREHENSIVE METABOLIC PANEL
ALT: 21 U/L (ref 0–35)
AST: 18 U/L (ref 0–37)
Albumin: 4.1 g/dL (ref 3.5–5.2)
Alkaline Phosphatase: 54 U/L (ref 39–117)
BUN: 12 mg/dL (ref 6–23)
CO2: 27 mEq/L (ref 19–32)
Calcium: 9.1 mg/dL (ref 8.4–10.5)
Chloride: 106 mEq/L (ref 96–112)
Creatinine, Ser: 0.9 mg/dL (ref 0.40–1.20)
GFR: 66.01 mL/min (ref >60.00)
Glucose, Bld: 108 mg/dL — ABNORMAL HIGH (ref 70–99)
Potassium: 4 mEq/L (ref 3.5–5.1)
Sodium: 141 mEq/L (ref 135–145)
Total Bilirubin: 0.5 mg/dL (ref 0.2–1.2)
Total Protein: 6.9 g/dL (ref 6.0–8.3)

## 2019-01-14 LAB — CBC
HCT: 41.9 % (ref 36.0–46.0)
Hemoglobin: 13.9 g/dL (ref 12.0–15.0)
MCHC: 33.3 g/dL (ref 30.0–36.0)
MCV: 87.4 fl (ref 78.0–100.0)
Platelets: 252 10*3/uL (ref 150.0–400.0)
RBC: 4.79 Mil/uL (ref 3.87–5.11)
RDW: 12.5 % (ref 11.5–15.5)
WBC: 6 10*3/uL (ref 4.0–10.5)

## 2019-01-14 LAB — FOLATE: Folate: 16.5 ng/mL (ref >5.9)

## 2019-01-14 LAB — VITAMIN D 25 HYDROXY (VIT D DEFICIENCY, FRACTURES): VITD: 41 ng/mL (ref 30.00–100.00)

## 2019-01-14 LAB — TSH: TSH: 0.06 u[IU]/mL — ABNORMAL LOW (ref 0.35–4.50)

## 2019-01-14 LAB — VITAMIN B12: Vitamin B-12: 276 pg/mL (ref 211–911)

## 2019-01-14 LAB — HM DIABETES EYE EXAM

## 2019-01-14 MED ORDER — LEVOTHYROXINE SODIUM 125 MCG PO TABS: 125.0000 ug | ORAL_TABLET | Freq: Every day | ORAL | 3 refills | Status: DC

## 2019-01-14 NOTE — Patient Instructions (Signed)
Good to see you today Flu shot given I will be in touch with your labs Assuming we don't find an answer there, we will plan to have you see sports med in [place] about your foot Likely you have a benign issue such as a neuroma.  If any change or worsening please alert me!

## 2019-01-25 DIAGNOSIS — G5761 Lesion of plantar nerve, right lower limb: Secondary | ICD-10-CM | POA: Diagnosis not present

## 2019-01-28 ENCOUNTER — Other Ambulatory Visit: Payer: Self-pay

## 2019-01-28 DIAGNOSIS — U071 COVID-19: Secondary | ICD-10-CM

## 2019-01-29 LAB — NOVEL CORONAVIRUS, NAA: SARS-CoV-2, NAA: NOT DETECTED

## 2019-02-18 ENCOUNTER — Other Ambulatory Visit (INDEPENDENT_AMBULATORY_CARE_PROVIDER_SITE_OTHER): Payer: BC Managed Care – PPO

## 2019-02-18 ENCOUNTER — Other Ambulatory Visit: Payer: Self-pay

## 2019-02-18 DIAGNOSIS — R7989 Other specified abnormal findings of blood chemistry: Secondary | ICD-10-CM

## 2019-02-19 ENCOUNTER — Encounter: Payer: Self-pay | Admitting: Family Medicine

## 2019-02-19 LAB — TSH: TSH: 1.8 u[IU]/mL (ref 0.35–4.50)

## 2019-02-22 ENCOUNTER — Encounter: Payer: Self-pay | Admitting: Family Medicine

## 2019-03-12 ENCOUNTER — Other Ambulatory Visit: Payer: Self-pay

## 2019-03-12 DIAGNOSIS — Z20822 Contact with and (suspected) exposure to covid-19: Secondary | ICD-10-CM

## 2019-03-12 DIAGNOSIS — Z20828 Contact with and (suspected) exposure to other viral communicable diseases: Secondary | ICD-10-CM | POA: Diagnosis not present

## 2019-03-13 LAB — NOVEL CORONAVIRUS, NAA: SARS-CoV-2, NAA: NOT DETECTED

## 2019-03-19 ENCOUNTER — Other Ambulatory Visit: Payer: Self-pay

## 2019-03-19 DIAGNOSIS — Z20828 Contact with and (suspected) exposure to other viral communicable diseases: Secondary | ICD-10-CM | POA: Diagnosis not present

## 2019-03-19 DIAGNOSIS — Z20822 Contact with and (suspected) exposure to covid-19: Secondary | ICD-10-CM

## 2019-03-21 LAB — NOVEL CORONAVIRUS, NAA: SARS-CoV-2, NAA: NOT DETECTED

## 2019-03-23 DIAGNOSIS — G4733 Obstructive sleep apnea (adult) (pediatric): Secondary | ICD-10-CM | POA: Diagnosis not present

## 2019-03-23 DIAGNOSIS — Z20828 Contact with and (suspected) exposure to other viral communicable diseases: Secondary | ICD-10-CM | POA: Diagnosis not present

## 2019-03-29 ENCOUNTER — Encounter: Payer: Self-pay | Admitting: Family Medicine

## 2019-03-30 NOTE — Telephone Encounter (Signed)
Form sent via email.

## 2019-03-30 NOTE — Telephone Encounter (Signed)
Princess, do you mind printing out her immunization and emailing it to her through the fax machine up front? Thank you!!

## 2019-04-01 ENCOUNTER — Other Ambulatory Visit: Payer: Self-pay

## 2019-04-01 DIAGNOSIS — Z20822 Contact with and (suspected) exposure to covid-19: Secondary | ICD-10-CM

## 2019-04-02 LAB — NOVEL CORONAVIRUS, NAA: SARS-CoV-2, NAA: NOT DETECTED

## 2019-04-06 ENCOUNTER — Other Ambulatory Visit: Payer: Self-pay | Admitting: Family Medicine

## 2019-04-06 MED ORDER — BUPROPION HCL ER (XL) 300 MG PO TB24: 300.0000 mg | ORAL_TABLET | Freq: Every day | ORAL | 0 refills | Status: DC

## 2019-04-09 ENCOUNTER — Other Ambulatory Visit: Payer: Self-pay | Admitting: Family Medicine

## 2019-04-09 DIAGNOSIS — R7989 Other specified abnormal findings of blood chemistry: Secondary | ICD-10-CM

## 2019-04-29 ENCOUNTER — Other Ambulatory Visit: Payer: Self-pay

## 2019-04-29 DIAGNOSIS — Z20822 Contact with and (suspected) exposure to covid-19: Secondary | ICD-10-CM

## 2019-05-01 LAB — NOVEL CORONAVIRUS, NAA: SARS-CoV-2, NAA: NOT DETECTED

## 2019-05-06 ENCOUNTER — Other Ambulatory Visit: Payer: Self-pay

## 2019-05-06 DIAGNOSIS — Z20822 Contact with and (suspected) exposure to covid-19: Secondary | ICD-10-CM

## 2019-05-07 LAB — NOVEL CORONAVIRUS, NAA: SARS-CoV-2, NAA: NOT DETECTED

## 2019-05-10 ENCOUNTER — Encounter: Payer: Self-pay | Admitting: Family Medicine

## 2019-05-11 ENCOUNTER — Encounter: Payer: Self-pay | Admitting: Family Medicine

## 2019-05-11 ENCOUNTER — Ambulatory Visit (INDEPENDENT_AMBULATORY_CARE_PROVIDER_SITE_OTHER): Payer: BC Managed Care – PPO | Admitting: Family Medicine

## 2019-05-11 ENCOUNTER — Other Ambulatory Visit: Payer: Self-pay

## 2019-05-11 DIAGNOSIS — R739 Hyperglycemia, unspecified: Secondary | ICD-10-CM | POA: Diagnosis not present

## 2019-05-11 DIAGNOSIS — R7989 Other specified abnormal findings of blood chemistry: Secondary | ICD-10-CM

## 2019-05-11 DIAGNOSIS — F418 Other specified anxiety disorders: Secondary | ICD-10-CM

## 2019-05-11 MED ORDER — "BD LUER-LOK SYRINGE 25G X 1"" 3 ML MISC": 1 refills | Status: DC

## 2019-05-11 MED ORDER — BUPROPION HCL ER (XL) 300 MG PO TB24: 300.0000 mg | ORAL_TABLET | Freq: Every day | ORAL | 1 refills | Status: DC

## 2019-05-11 MED ORDER — LEVOTHYROXINE SODIUM 125 MCG PO TABS: ORAL_TABLET | ORAL | 1 refills | Status: DC

## 2019-05-11 MED ORDER — SERTRALINE HCL 25 MG PO TABS: 25.0000 mg | ORAL_TABLET | Freq: Every day | ORAL | 2 refills | Status: DC

## 2019-05-11 MED ORDER — CYANOCOBALAMIN 1000 MCG/ML IJ SOLN: INTRAMUSCULAR | 3 refills | Status: DC

## 2019-05-11 NOTE — Progress Notes (Signed)
Virtual Visit via Video Note  I connected with Tiffany Morris on 05/11/19 at  8:40 AM EST by a video enabled telemedicine application and verified that I am speaking with the correct person using two identifiers.  Location: Patient: home Provider: office   I discussed the limitations of evaluation and management by telemedicine and the availability of in person appointments. The patient expressed understanding and agreed to proceed. Magdalene Molly, CMA was able to get the patient set up on a video visit   Subjective:    Patient ID: Tiffany Morris, female    DOB: 01-19-1966, 53 y.o.   MRN: [id number]  No chief complaint on file.   HPI Patient is in today for evaluation of worsening anxiety and depression during the pandemic and working and caring for her son and family. She is quick tempered, has trouble concentrating, anhedonia. She denies any manic issues or risky behavior. She just snaps quickly especially with her son and husband. Denies CP/palp/SOB/HA/congestion/fevers/GI or GU c/o. Taking meds as prescribed  Past Medical History:  Diagnosis Date  . Chicken pox as a child  . Depression with anxiety 08/05/2011  . 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 Hyperglycemia 06/05/2014  . Hyperlipidemia 09/05/2011  . hypothyroidism 06/02/2010  . Obesity   . Pain of left breast 08/05/2011  . Pedal edema 08/05/2011  . Thyroid disease    hyper  . Unspecified sleep apnea 08/20/2013  . Vitamin B 12 deficiency     Past Surgical History:  Procedure Laterality Date  . CESAREAN SECTION  2004  . CHOLECYSTECTOMY     laparoscopic  . COLONOSCOPY  2014  . RETINAL DETACHMENT REPAIR W/ SCLERAL BUCKLE LE  01-25-10    Family History  Problem Relation Age of Onset  . Hyperlipidemia Mother   . Diabetes Mother        borderline  . Hyperlipidemia Father   . Hypertension Father   . Diabetes Father    type 2  . Hyperthyroidism Father   . Obesity Father   . Cancer Maternal Grandmother        lung- smoker  . COPD Maternal Grandfather        smoked  . Cancer Maternal Grandfather        bladder  . Heart disease Paternal Grandmother        CHF, MI  . Hypothyroidism Paternal Grandmother   . Hypertension Paternal Grandmother   . Hypertension Paternal Grandfather   . Heart disease Paternal Grandfather        open heart surgery  . Hyperlipidemia Paternal Grandfather   . Macular degeneration Paternal Grandfather   . Autism spectrum disorder Son     Social History   Socioeconomic History  . Marital status: Married    Spouse name: Not on file  . Number of children: Not on file  . Years of education: Not on file  . Highest education level: Not on file  Occupational History  . Occupation: NP    Employer: [employer]  Tobacco Use  . Smoking status: Never Smoker  . Smokeless tobacco: Current User  Substance and Sexual Activity  . Alcohol use: No    Alcohol/week: 0.0 standard drinks  . Drug use: No  . Sexual activity: Not Currently    Comment: lives with husband, son age 4, no dietary restrictions.. Nurse Practiitoner with [employer]  Other Topics Concern  . 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    Social Determinants of Health   Financial Resource Strain:   . Difficulty of Paying Living Expenses: Not on file  Food Insecurity:   . Worried About Programme researcher, broadcasting/film/video in the Last Year: Not on file  . Ran Out of Food in the Last Year: Not on file  Transportation Needs:   . Lack of Transportation (Medical): Not on file  . Lack of Transportation (Non-Medical): Not on file  Physical Activity:   . Days of Exercise per Week: Not on file  . Minutes of Exercise per Session: Not on file  Stress:   . Feeling of Stress : Not on file  Social Connections:   . Frequency of Communication with  Friends and Family: Not on file  . Frequency of Social Gatherings with Friends and Family: Not on file  . Attends Religious Services: Not on file  . Active Member of Clubs or Organizations: Not on file  . Attends Banker Meetings: Not on file  . Marital Status: Not on file  Intimate Partner Violence:   . Fear of Current or Ex-Partner: Not on file  . Emotionally Abused: Not on file  . Physically Abused: Not on file  . Sexually Abused: Not on file    Outpatient Medications Prior to Visit  Medication Sig Dispense Refill  . Biotin 5000 MCG CAPS Take by mouth.    . levonorgestrel (MIRENA) 20 MCG/24HR IUD 1 each by Intrauterine route once.    . Omega-3 Fatty Acids (OMEGA 3 PO) Take by mouth daily.    . Vitamin D, Ergocalciferol, (DRISDOL) 1.25 MG (50000 UT) CAPS capsule Take 1 capsule (50,000 Units total) by mouth every 7 (seven) days. 4 capsule 4  . B-D 3CC LUER-LOK SYR 25GX1" 25G X 1" 3 ML MISC USE AS DIRECTED 6 each 0  . buPROPion (WELLBUTRIN XL) 300 MG 24 hr tablet Take 1 tablet (300 mg total) by mouth daily. NEEDS OV FOR FURTHER REFILLS 30 tablet 0  . cyanocobalamin (,VITAMIN B-12,) 1000 MCG/ML injection INJECT 1 ML INTRAMUSCULARLY ONCE 10 mL 0  . levothyroxine (SYNTHROID) 125 MCG tablet TAKE 1 TABLET BY MOUTH ONCE DAILY BEFORE BREAKFAST 90 tablet 1   No facility-administered medications prior to visit.    No Known Allergies  Review of Systems  Constitutional: Positive for malaise/fatigue. Negative for fever.  HENT: Negative for congestion.   Eyes: Negative for blurred vision.  Respiratory: Negative for cough and shortness of breath.   Cardiovascular: Negative for chest pain, palpitations and leg swelling.  Gastrointestinal: Negative for vomiting.  Musculoskeletal: Negative for back pain.  Skin: Negative for rash.  Neurological: Negative for loss of consciousness and headaches.  Psychiatric/Behavioral: Positive for depression. The patient is nervous/anxious.         Objective:    Physical Exam Constitutional:      Appearance: Normal appearance. She is not ill-appearing.  HENT:     Head: Normocephalic and atraumatic.     Nose: Nose normal.  Eyes:     General:        Right eye: No discharge.        Left eye: No discharge.  Pulmonary:     Effort: Pulmonary effort is normal.  Neurological:     Mental Status: She is oriented to person, place, and time.  Psychiatric:  Mood and Affect: Mood normal.        Behavior: Behavior normal.     Wt 249 lb (112.9 kg)   BMI 42.74 kg/m  Wt Readings from Last 3 Encounters:  05/11/19 249 lb (112.9 kg)  01/14/19 246 lb (111.6 kg)  05/11/18 242 lb (109.8 kg)    Diabetic Foot Exam - Simple   No data filed     Lab Results  Component Value Date   WBC 6.0 01/14/2019   HGB 13.9 01/14/2019   HCT 41.9 01/14/2019   PLT 252.0 01/14/2019   GLUCOSE 108 (H) 01/14/2019   CHOL 234 (H) 05/11/2018   TRIG 128.0 05/11/2018   HDL 52.20 05/11/2018   LDLDIRECT 168.3 06/02/2014   LDLCALC 156 (H) 05/11/2018   ALT 21 01/14/2019   AST 18 01/14/2019   NA 141 01/14/2019   K 4.0 01/14/2019   CL 106 01/14/2019   CREATININE 0.90 01/14/2019   BUN 12 01/14/2019   CO2 27 01/14/2019   TSH 1.80 02/18/2019   HGBA1C 5.5 05/11/2018    Lab Results  Component Value Date   TSH 1.80 02/18/2019   Lab Results  Component Value Date   WBC 6.0 01/14/2019   HGB 13.9 01/14/2019   HCT 41.9 01/14/2019   MCV 87.4 01/14/2019   PLT 252.0 01/14/2019   Lab Results  Component Value Date   NA 141 01/14/2019   K 4.0 01/14/2019   CO2 27 01/14/2019   GLUCOSE 108 (H) 01/14/2019   BUN 12 01/14/2019   CREATININE 0.90 01/14/2019   BILITOT 0.5 01/14/2019   ALKPHOS 54 01/14/2019   AST 18 01/14/2019   ALT 21 01/14/2019   PROT 6.9 01/14/2019   ALBUMIN 4.1 01/14/2019   CALCIUM 9.1 01/14/2019   GFR 66.01 01/14/2019   Lab Results  Component Value Date   CHOL 234 (H) 05/11/2018   Lab Results  Component Value Date   HDL  52.20 05/11/2018   Lab Results  Component Value Date   LDLCALC 156 (H) 05/11/2018   Lab Results  Component Value Date   TRIG 128.0 05/11/2018   Lab Results  Component Value Date   CHOLHDL 4 05/11/2018   Lab Results  Component Value Date   HGBA1C 5.5 05/11/2018       Assessment & Plan:   Problem List Items Addressed This Visit    Depression with anxiety    She notes increased anxiety and depression with increased irritability and short tempered with family. No manic episodes of not sleeping, risky behavior etc. Will add back zoloft at 25 mg and keep Wellbutrin for now. Reassess in 4-6 weeks      Relevant Medications   buPROPion (WELLBUTRIN XL) 300 MG 24 hr tablet   sertraline (ZOLOFT) 25 MG tablet   Hyperglycemia    hgba1c acceptable, minimize simple carbs. Increase exercise as tolerated.struggling to take care of herself as her anxiety and depression worsens       Other Visit Diagnoses    Abnormal TSH       Relevant Medications   levothyroxine (SYNTHROID) 125 MCG tablet      I have changed Marcelle Smiling. Pridgeon "Kim"'s buPROPion and B-D 3CC LUER-LOK SYR 25GX1". I am also having her start on sertraline. Additionally, I am having her maintain her levonorgestrel, Omega-3 Fatty Acids (OMEGA 3 PO), Vitamin D (Ergocalciferol), Biotin, levothyroxine, and cyanocobalamin.  Meds ordered this encounter  Medications  . buPROPion (WELLBUTRIN XL) 300 MG 24 hr tablet    Sig:  Take 1 tablet (300 mg total) by mouth daily.    Dispense:  90 tablet    Refill:  1  . levothyroxine (SYNTHROID) 125 MCG tablet    Sig: TAKE 1 TABLET BY MOUTH ONCE DAILY BEFORE BREAKFAST    Dispense:  90 tablet    Refill:  1  . cyanocobalamin (,VITAMIN B-12,) 1000 MCG/ML injection    Sig: INJECT 1 ML INTRAMUSCULARLY ONCE    Dispense:  30 mL    Refill:  3  . SYRINGE-NEEDLE, DISP, 3 ML (B-D 3CC LUER-LOK SYR 25GX1") 25G X 1" 3 ML MISC    Sig: USE AS DIRECTED    Dispense:  12 each    Refill:  1  .  sertraline (ZOLOFT) 25 MG tablet    Sig: Take 1 tablet (25 mg total) by mouth daily.    Dispense:  30 tablet    Refill:  2     I discussed the assessment and treatment plan with the patient. The patient was provided an opportunity to ask questions and all were answered. The patient agreed with the plan and demonstrated an understanding of the instructions.   The patient was advised to call back or seek an in-person evaluation if the symptoms worsen or if the condition fails to improve as anticipated.  I provided 15 minutes of non-face-to-face time during this encounter.   Danise EdgeStacey Jaxxen Voong, MD

## 2019-05-11 NOTE — Assessment & Plan Note (Signed)
hgba1c acceptable, minimize simple carbs. Increase exercise as tolerated.struggling to take care of herself as her anxiety and depression worsens

## 2019-05-11 NOTE — Assessment & Plan Note (Signed)
She notes increased anxiety and depression with increased irritability and short tempered with family. No manic episodes of not sleeping, risky behavior etc. Will add back zoloft at 25 mg and keep Wellbutrin for now. Reassess in 4-6 weeks

## 2019-05-18 ENCOUNTER — Other Ambulatory Visit: Payer: BC Managed Care – PPO

## 2019-05-19 ENCOUNTER — Ambulatory Visit: Payer: BC Managed Care – PPO | Attending: Family Medicine

## 2019-05-19 ENCOUNTER — Other Ambulatory Visit: Payer: Self-pay

## 2019-05-19 DIAGNOSIS — Z20828 Contact with and (suspected) exposure to other viral communicable diseases: Secondary | ICD-10-CM | POA: Diagnosis not present

## 2019-05-19 DIAGNOSIS — Z20822 Contact with and (suspected) exposure to covid-19: Secondary | ICD-10-CM

## 2019-05-20 LAB — NOVEL CORONAVIRUS, NAA: SARS-CoV-2, NAA: NOT DETECTED

## 2019-06-22 DIAGNOSIS — G4733 Obstructive sleep apnea (adult) (pediatric): Secondary | ICD-10-CM | POA: Diagnosis not present

## 2019-07-23 ENCOUNTER — Other Ambulatory Visit: Payer: Self-pay

## 2019-07-25 NOTE — Progress Notes (Addendum)
[facility] [address], [address] [place], [place] [address] [phone]  Date:  07/26/2019   Name:  Tiffany Morris   DOB:  [birth year]   MRN:  [id number]  PCP:  Bradd Canary, MD    Chief Complaint: Back Pain (lower back in to left hip, no injury)   History of Present Illness:  Tiffany Morris is a 54 y.o. very pleasant female patient who presents with the following:  Primary patient of my partner Dr. 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  She feels like her left hip might not be as flexible as her right chronically She did not have sciatica in particular during her pregnanacy  The pain does not run down her leg- it "grabs" in the buttock more Feels more muscular than nerve to her  It hurt to get herself out of her car with her left leg  No numbness or weakness of the leg  No bowel or bladder incontinence, no saddle anesthesia She has no recent lower back films No urinary sx or blood in her urine   She has an IUD- for about 6 years now - overdue for a change  Pregnancy unlikely but will rule out  Noted that she is tachycardic, this is not typical for patient.  Upon questioning she does admit to feeling somewhat short of breath today.  She has no chest pain or pressure  Patient Active Problem List   Diagnosis Date Noted  . Vitamin D deficiency 05/11/2018  . Hyperglycemia 06/05/2014  . OSA (obstructive sleep  apnea) 09/22/2013  . Hyperlipidemia 09/05/2011  . Depression with anxiety 08/05/2011  . Preventative health care 08/05/2011  . Pedal edema 08/05/2011  . Vitamin B 12 deficiency   . Obesity   . H/O thyrotoxicosis 06/02/2010    Past Medical History:  Diagnosis Date  . Chicken pox as a child  . Depression with anxiety 08/05/2011  . 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 Hyperglycemia 06/05/2014  . Hyperlipidemia 09/05/2011  . hypothyroidism 06/02/2010  . Obesity   . Pain of left breast 08/05/2011  . Pedal edema 08/05/2011  . Thyroid disease    hyper  . Unspecified sleep apnea 08/20/2013  . Vitamin B 12 deficiency     Past Surgical History:  Procedure Laterality Date  . CESAREAN SECTION  2004  . CHOLECYSTECTOMY     laparoscopic  . COLONOSCOPY  2014  . RETINAL DETACHMENT REPAIR W/ SCLERAL BUCKLE LE  01-25-10    Social History   Tobacco Use  . Smoking status: Never Smoker  . Smokeless tobacco: Current User  Substance Use Topics  . Alcohol use: No    Alcohol/week: 0.0 standard drinks  . Drug use: No    Family History  Problem Relation Age of Onset  . Hyperlipidemia Mother   .  Diabetes Mother        borderline  . Hyperlipidemia Father   . Hypertension Father   . Diabetes Father        type 2  . Hyperthyroidism Father   . Obesity Father   . Cancer Maternal Grandmother        lung- smoker  . COPD Maternal Grandfather        smoked  . Cancer Maternal Grandfather        bladder  . Heart disease Paternal Grandmother        CHF, MI  . Hypothyroidism Paternal Grandmother   . Hypertension Paternal Grandmother   . Hypertension Paternal Grandfather   . Heart disease Paternal Grandfather        open heart surgery  . Hyperlipidemia Paternal Grandfather   . Macular degeneration Paternal Grandfather   . Autism spectrum disorder Son     No Known Allergies  Medication list has been  reviewed and updated.  Current Outpatient Medications on File Prior to Visit  Medication Sig Dispense Refill  . Biotin 5000 MCG CAPS Take by mouth.    Marland Kitchen buPROPion (WELLBUTRIN XL) 300 MG 24 hr tablet Take 1 tablet (300 mg total) by mouth daily. 90 tablet 1  . cyanocobalamin (,VITAMIN B-12,) 1000 MCG/ML injection INJECT 1 ML INTRAMUSCULARLY ONCE 30 mL 3  . levonorgestrel (MIRENA) 20 MCG/24HR IUD 1 each by Intrauterine route once.    Marland Kitchen levothyroxine (SYNTHROID) 125 MCG tablet TAKE 1 TABLET BY MOUTH ONCE DAILY BEFORE BREAKFAST 90 tablet 1  . Omega-3 Fatty Acids (OMEGA 3 PO) Take by mouth daily.    . sertraline (ZOLOFT) 25 MG tablet Take 1 tablet (25 mg total) by mouth daily. 30 tablet 2  . SYRINGE-NEEDLE, DISP, 3 ML (B-D 3CC LUER-LOK SYR 25GX1") 25G X 1" 3 ML MISC USE AS DIRECTED 12 each 1   No current facility-administered medications on file prior to visit.    Review of Systems:  As per HPI- otherwise negative.   Physical Examination: Vitals:   07/26/19 1525  BP: 128/80  Pulse: (!) 111  Resp: 17  Temp: (!) 96.5 F (35.8 C)  SpO2: 98%   Vitals:   07/26/19 1525  Weight: 254 lb (115.2 kg)  Height: 5\' 4"  (1.626 m)   Body mass index is 43.6 kg/m. Ideal Body Weight: Weight in (lb) to have BMI = 25: 145.3  GEN: no acute distress.  Obese, looks well HEENT: Atraumatic, Normocephalic.  Ears and Nose: No external deformity. CV: RRR-tachycardic, No M/G/R. No JVD. No thrill. No extra heart sounds. PULM: CTA B, no wheezes, crackles, rhonchi. No retractions. No resp. distress. No accessory muscle use. ABD: S, NT, ND, +BS. No rebound. No HSM. EXTR: No c/c/e PSYCH: Normally interactive. Conversant.  Normal bilateral left lower extremity sensation and DTR.  The left hamstring flexion is somewhat limited due to pain.  Thoracolumbar extension and flexion is normal.  She notes pain over the left sciatic notch and left buttock.  Skin is examined, no lesion or sign of shingles  Pulse  Readings from Last 3 Encounters:  07/26/19 (!) 111  01/14/19 70  05/11/18 82   EKG: sinus tachycardia with rate of 104.  No ST elevation or depression but she does have low voltage in chest leads  No old EKG for comparison .  Results for orders placed or performed in visit on 07/26/19  POCT urine pregnancy  Result Value Ref Range   Preg Test, Ur Negative Negative  Assessment and Plan: Low back pain radiating to left lower extremity - Plan: methocarbamol (ROBAXIN) 500 MG tablet, predniSONE (DELTASONE) 20 MG tablet  Tachycardia - Plan: EKG 12-Lead, DG Chest 2 View, D-Dimer, Quantitative, Troponin I (High Sensitivity), POCT urine pregnancy, TSH, CBC, Basic metabolic panel  SOB (shortness of breath) - Plan: CBC  Here today with lower back pain and also incidentally noted tachycardia and mild shortness of breath  We will treat for lower back pain muscle spasm with Robaxin, cautioned regarding sedation.  We will also use prednisone for 6 days for possible sciatic nerve involvement Discussed tachycardia and shortness of breath with patient.  Her EKG does not suggest ACS.  A more likely concern would be a pulmonary embolism.  Offered to have her seen in the ER now, she declines.  Will obtain a chest film, D-dimer and stat troponin.  I will be in touch with her pending her labs.  She understands that a CT angiogram will be the next step if positive D-dimer  She agrees to seek care in the emergency department if any worsening Moderate medical decision making This visit occurred during the SARS-CoV-2 public health emergency.  Safety protocols were in place, including screening questions prior to the visit, additional usage of staff PPE, and extensive cleaning of exam room while observing appropriate contact time as indicated for disinfecting solutions.    Signed Abbe Amsterdam, MD  Received her chest film, message to patient  DG Chest 2 View  Result Date: 07/26/2019 CLINICAL DATA:   Tachycardia, shortness of breath EXAM: CHEST - 2 VIEW COMPARISON:  None. FINDINGS: The heart size and mediastinal contours are within normal limits. Both lungs are clear. Disc degenerative disease of the thoracic spine. IMPRESSION: No acute abnormality of the lungs. Electronically Signed   By: Lauralyn Primes M.D.   On: 07/26/2019 16:38   Realized that I had ordered troponin as routine instead of stat- called pt at 8:25 to let her know.  She is feeling ok, is comfortable with this lab coming back tomorrow  Addendum 3/2 Received positive D-dimer earlier this morning, ordered CT angiogram and contact patient  Received the rest of her labs, message to patient Results for orders placed or performed in visit on 07/26/19  D-Dimer, Quantitative  Result Value Ref Range   D-Dimer, Quant 0.51 (H) <0.50 mcg/mL FEU  TSH  Result Value Ref Range   TSH 6.47 (H) 0.35 - 4.50 uIU/mL  CBC  Result Value Ref Range   WBC 8.9 4.0 - 10.5 K/uL   RBC 4.83 3.87 - 5.11 Mil/uL   Platelets 284.0 150.0 - 400.0 K/uL   Hemoglobin 14.4 12.0 - 15.0 g/dL   HCT 21.1 94.1 - 74.0 %   MCV 88.0 78.0 - 100.0 fl   MCHC 33.9 30.0 - 36.0 g/dL   RDW 81.4 48.1 - 85.6 %  Basic metabolic panel  Result Value Ref Range   Sodium 139 135 - 145 mEq/L   Potassium 3.9 3.5 - 5.1 mEq/L   Chloride 105 96 - 112 mEq/L   CO2 26 19 - 32 mEq/L   Glucose, Bld 119 (H) 70 - 99 mg/dL   BUN 12 6 - 23 mg/dL   Creatinine, Ser 3.14 0.40 - 1.20 mg/dL   GFR 97.02 (L) >63.78 mL/min   Calcium 9.2 8.4 - 10.5 mg/dL  POCT urine pregnancy  Result Value Ref Range   Preg Test, Ur Negative Negative  Troponin I (High Sensitivity)  Result Value Ref Range  High Sens Troponin I <2 2 - 17 ng/L

## 2019-07-25 NOTE — Patient Instructions (Addendum)
Good to see you again today I am sorry your back is bothering you Please use the robaxin as needed for back pain- this may cause drowsiness Use the prednisone for inflammation Tylenol ok, avoid NSAIDs while you are on prednisone   Please let me know if you are not feeling better in the next few days- Sooner if worse.

## 2019-07-26 ENCOUNTER — Ambulatory Visit (HOSPITAL_BASED_OUTPATIENT_CLINIC_OR_DEPARTMENT_OTHER)
Admission: RE | Admit: 2019-07-26 | Discharge: 2019-07-26 | Disposition: A | Discharge status: Home / Self Care | Payer: BC Managed Care – PPO | Source: Ambulatory Visit | Attending: Family Medicine | Admitting: Family Medicine

## 2019-07-26 ENCOUNTER — Ambulatory Visit (INDEPENDENT_AMBULATORY_CARE_PROVIDER_SITE_OTHER): Payer: BC Managed Care – PPO | Admitting: Family Medicine

## 2019-07-26 ENCOUNTER — Other Ambulatory Visit: Payer: Self-pay

## 2019-07-26 ENCOUNTER — Encounter: Payer: Self-pay | Admitting: Family Medicine

## 2019-07-26 VITALS — BP 128/80 | HR 111 | Temp 96.5°F | Resp 17 | Ht 64.0 in | Wt 254.0 lb

## 2019-07-26 DIAGNOSIS — R000 Tachycardia, unspecified: Secondary | ICD-10-CM | POA: Insufficient documentation

## 2019-07-26 DIAGNOSIS — R0602 Shortness of breath: Secondary | ICD-10-CM

## 2019-07-26 DIAGNOSIS — M79605 Pain in left leg: Secondary | ICD-10-CM

## 2019-07-26 DIAGNOSIS — M545 Low back pain, unspecified: Secondary | ICD-10-CM

## 2019-07-26 LAB — POCT URINE PREGNANCY: Preg Test, Ur: NEGATIVE

## 2019-07-26 MED ORDER — METHOCARBAMOL 500 MG PO TABS: 500.0000 mg | ORAL_TABLET | Freq: Three times a day (TID) | ORAL | 0 refills | Status: DC | PRN

## 2019-07-26 MED ORDER — PREDNISONE 20 MG PO TABS: ORAL_TABLET | ORAL | 0 refills | Status: DC

## 2019-07-27 ENCOUNTER — Ambulatory Visit (HOSPITAL_BASED_OUTPATIENT_CLINIC_OR_DEPARTMENT_OTHER)
Admission: RE | Admit: 2019-07-27 | Discharge: 2019-07-27 | Disposition: A | Discharge status: Home / Self Care | Payer: BC Managed Care – PPO | Source: Ambulatory Visit | Attending: Family Medicine | Admitting: Family Medicine

## 2019-07-27 ENCOUNTER — Encounter: Payer: Self-pay | Admitting: Family Medicine

## 2019-07-27 DIAGNOSIS — R0602 Shortness of breath: Secondary | ICD-10-CM | POA: Diagnosis not present

## 2019-07-27 DIAGNOSIS — R7989 Other specified abnormal findings of blood chemistry: Secondary | ICD-10-CM

## 2019-07-27 LAB — CBC
HCT: 42.5 % (ref 36.0–46.0)
Hemoglobin: 14.4 g/dL (ref 12.0–15.0)
MCHC: 33.9 g/dL (ref 30.0–36.0)
MCV: 88 fl (ref 78.0–100.0)
Platelets: 284 10*3/uL (ref 150.0–400.0)
RBC: 4.83 Mil/uL (ref 3.87–5.11)
RDW: 12.8 % (ref 11.5–15.5)
WBC: 8.9 10*3/uL (ref 4.0–10.5)

## 2019-07-27 LAB — BASIC METABOLIC PANEL
BUN: 12 mg/dL (ref 6–23)
CO2: 26 mEq/L (ref 19–32)
Calcium: 9.2 mg/dL (ref 8.4–10.5)
Chloride: 105 mEq/L (ref 96–112)
Creatinine, Ser: 1.11 mg/dL (ref 0.40–1.20)
GFR: 51.71 mL/min — ABNORMAL LOW (ref >60.00)
Glucose, Bld: 119 mg/dL — ABNORMAL HIGH (ref 70–99)
Potassium: 3.9 mEq/L (ref 3.5–5.1)
Sodium: 139 mEq/L (ref 135–145)

## 2019-07-27 LAB — TROPONIN I (HIGH SENSITIVITY): High Sens Troponin I: <2 ng/L (ref 2–17)

## 2019-07-27 LAB — TSH: TSH: 6.47 u[IU]/mL — ABNORMAL HIGH (ref 0.35–4.50)

## 2019-07-27 LAB — D-DIMER, QUANTITATIVE: D-Dimer, Quant: 0.51 mcg/mL FEU — ABNORMAL HIGH (ref <0.50)

## 2019-07-27 MED ORDER — LEVOTHYROXINE SODIUM 137 MCG PO TABS: ORAL_TABLET | ORAL | 4 refills | Status: DC

## 2019-07-27 MED ORDER — IOHEXOL 350 MG/ML SOLN
100.0000 mL | Freq: Once | INTRAVENOUS | Status: AC | PRN
  Administered 2019-07-27: 100 mL via INTRAVENOUS

## 2019-08-03 ENCOUNTER — Encounter: Payer: Self-pay | Admitting: Family Medicine

## 2019-08-03 ENCOUNTER — Other Ambulatory Visit: Payer: Self-pay | Admitting: Family Medicine

## 2019-08-03 MED ORDER — SERTRALINE HCL 50 MG PO TABS: 50.0000 mg | ORAL_TABLET | Freq: Every day | ORAL | 0 refills | Status: DC

## 2019-08-04 NOTE — Telephone Encounter (Signed)
Appointment scheduled.

## 2019-09-27 ENCOUNTER — Other Ambulatory Visit: Payer: Self-pay

## 2019-09-27 ENCOUNTER — Telehealth (INDEPENDENT_AMBULATORY_CARE_PROVIDER_SITE_OTHER): Payer: BC Managed Care – PPO | Admitting: Family Medicine

## 2019-09-27 ENCOUNTER — Encounter: Payer: Self-pay | Admitting: Family Medicine

## 2019-09-27 VITALS — Wt 253.0 lb

## 2019-09-27 DIAGNOSIS — E785 Hyperlipidemia, unspecified: Secondary | ICD-10-CM

## 2019-09-27 DIAGNOSIS — R7989 Other specified abnormal findings of blood chemistry: Secondary | ICD-10-CM

## 2019-09-27 DIAGNOSIS — R739 Hyperglycemia, unspecified: Secondary | ICD-10-CM

## 2019-09-27 DIAGNOSIS — E538 Deficiency of other specified B group vitamins: Secondary | ICD-10-CM

## 2019-09-27 DIAGNOSIS — E039 Hypothyroidism, unspecified: Secondary | ICD-10-CM

## 2019-09-27 DIAGNOSIS — E559 Vitamin D deficiency, unspecified: Secondary | ICD-10-CM

## 2019-09-27 DIAGNOSIS — F418 Other specified anxiety disorders: Secondary | ICD-10-CM

## 2019-09-29 NOTE — Progress Notes (Addendum)
Virtual Visit via Video Note  I connected with Tiffany Morris on 09/29/19 at  2:00 PM EDT by a video enabled telemedicine application and verified that I am speaking with the correct person using two identifiers.  Location: Patient: home Provider: office   I discussed the limitations of evaluation and management by telemedicine and the availability of in person appointments. The patient expressed understanding and agreed to proceed. Thelma Barge, CMA was able to get the patient set up on a visit, video    Subjective:    Patient ID: Tiffany Morris, female    DOB: [birth year], 54 y.o.   MRN: [id number]  Chief Complaint  Patient presents with  . Depression    HPI Patient is in today for follow up on chronic medical concerns. No recent febrile illness or hospitalizations. 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 She is much happier at new job. She feels much better. Denies CP/palp/SOB/HA/congestion/fevers/GI or GU c/o. Taking meds as prescribed she has been trying to maintain a heart healthy diet.   Past Medical History:  Diagnosis Date  . Chicken pox as a child  . Depression with anxiety 08/05/2011  . 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 Hyperglycemia 06/05/2014  . Hyperlipidemia 09/05/2011  . hypothyroidism 06/02/2010  . Obesity   . Pain of left breast 08/05/2011  . Pedal edema 08/05/2011  . Thyroid disease    hyper  . Unspecified sleep apnea 08/20/2013  . Vitamin B 12 deficiency     Past Surgical History:  Procedure Laterality Date  . CESAREAN SECTION  2004  . CHOLECYSTECTOMY     laparoscopic  . COLONOSCOPY  2014  . RETINAL DETACHMENT REPAIR W/ SCLERAL BUCKLE LE  01-25-10    Family History  Problem Relation Age of Onset  . Hyperlipidemia Mother   . Diabetes Mother        borderline  . Hyperlipidemia Father   . Hypertension Father   .  Diabetes Father        type 2  . Hyperthyroidism Father   . Obesity Father   . Cancer Maternal Grandmother        lung- smoker  . COPD Maternal Grandfather        smoked  . Cancer Maternal Grandfather        bladder  . Heart disease Paternal Grandmother        CHF, MI  . Hypothyroidism Paternal Grandmother   . Hypertension Paternal Grandmother   . Hypertension Paternal Grandfather   . Heart disease Paternal Grandfather        open heart surgery  . Hyperlipidemia Paternal Grandfather   . Macular degeneration Paternal Grandfather   . Autism spectrum disorder Son     Social History   Socioeconomic History  . Marital status: Married    Spouse name: Not on file  . Number of children: Not on file  . Years of education: Not on file  . Highest education level: Not on file  Occupational History  . Occupation: NP    Employer: [employer]  Tobacco Use  . Smoking status: Never Smoker  . Smokeless tobacco: Current User  Substance and Sexual Activity  . Alcohol use: No    Alcohol/week: 0.0 standard drinks  . Drug use: No  . Sexual activity: Not Currently    Comment: lives with husband, son age 39,  no dietary restrictions.. Nurse Practiitoner with [place]  Other Topics Concern  . 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    Social Determinants of Health   Financial Resource Strain:   . Difficulty of Paying Living Expenses:   Food Insecurity:   . Worried About Charity fundraiser in the Last Year:   . Arboriculturist in the Last Year:   Transportation Needs:   . Film/video editor (Medical):   Marland Kitchen Lack of Transportation (Non-Medical):   Physical Activity:   . Days of Exercise per Week:   . Minutes of Exercise per Session:   Stress:   . Feeling of Stress :   Social Connections:   . Frequency of Communication with Friends and Family:   . Frequency of Social Gatherings with  Friends and Family:   . Attends Religious Services:   . Active Member of Clubs or Organizations:   . Attends Archivist Meetings:   Marland Kitchen Marital Status:   Intimate Partner Violence:   . Fear of Current or Ex-Partner:   . Emotionally Abused:   Marland Kitchen Physically Abused:   . Sexually Abused:     Outpatient Medications Prior to Visit  Medication Sig Dispense Refill  . Biotin 5000 MCG CAPS Take by mouth.    Marland Kitchen buPROPion (WELLBUTRIN XL) 300 MG 24 hr tablet Take 1 tablet (300 mg total) by mouth daily. 90 tablet 1  . cyanocobalamin (,VITAMIN B-12,) 1000 MCG/ML injection INJECT 1 ML INTRAMUSCULARLY ONCE 30 mL 3  . levonorgestrel (MIRENA) 20 MCG/24HR IUD 1 each by Intrauterine route once.    . Omega-3 Fatty Acids (OMEGA 3 PO) Take by mouth daily.    . sertraline (ZOLOFT) 50 MG tablet Take 1 tablet (50 mg total) by mouth daily. 90 tablet 0  . SYRINGE-NEEDLE, DISP, 3 ML (B-D 3CC LUER-LOK SYR 25GX1") 25G X 1" 3 ML MISC USE AS DIRECTED 12 each 1  . levothyroxine (SYNTHROID) 137 MCG tablet TAKE 1 TABLET BY MOUTH ONCE DAILY BEFORE BREAKFAST (Patient not taking: Reported on 09/27/2019) 30 tablet 4  . methocarbamol (ROBAXIN) 500 MG tablet Take 1 tablet (500 mg total) by mouth every 8 (eight) hours as needed for muscle spasms. 30 tablet 0  . predniSONE (DELTASONE) 20 MG tablet Take 2 pills daily for 3 days, then 1 pil daily for 3 days 9 tablet 0   No facility-administered medications prior to visit.    No Known Allergies  Review of Systems  Constitutional: Negative for fever and malaise/fatigue.  HENT: Negative for congestion.   Eyes: Negative for blurred vision.  Respiratory: Negative for shortness of breath.   Cardiovascular: Negative for chest pain, palpitations and leg swelling.  Gastrointestinal: Negative for abdominal pain, blood in stool and nausea.  Genitourinary: Negative for dysuria and frequency.  Musculoskeletal: Positive for joint pain. Negative for falls.  Skin: Negative for rash.    Neurological: Negative for dizziness, loss of consciousness and headaches.  Endo/Heme/Allergies: Negative for environmental allergies.  Psychiatric/Behavioral: Negative for depression. The patient is not nervous/anxious.        Objective:    Physical Exam Constitutional:      Appearance: Normal appearance. She is not ill-appearing.  HENT:     Head: Normocephalic and atraumatic.     Nose: Nose normal.  Eyes:     General:  Right eye: No discharge.        Left eye: No discharge.  Pulmonary:     Effort: Pulmonary effort is normal.  Neurological:     Mental Status: She is alert and oriented to person, place, and time.  Psychiatric:        Behavior: Behavior normal.     Wt 253 lb (114.8 kg)   BMI 43.43 kg/m  Wt Readings from Last 3 Encounters:  09/27/19 253 lb (114.8 kg)  07/26/19 254 lb (115.2 kg)  05/11/19 249 lb (112.9 kg)    Diabetic Foot Exam - Simple   No data filed     Lab Results  Component Value Date   WBC 8.9 07/26/2019   HGB 14.4 07/26/2019   HCT 42.5 07/26/2019   PLT 284.0 07/26/2019   GLUCOSE 119 (H) 07/26/2019   CHOL 234 (H) 05/11/2018   TRIG 128.0 05/11/2018   HDL 52.20 05/11/2018   LDLDIRECT 168.3 06/02/2014   LDLCALC 156 (H) 05/11/2018   ALT 21 01/14/2019   AST 18 01/14/2019   NA 139 07/26/2019   K 3.9 07/26/2019   CL 105 07/26/2019   CREATININE 1.11 07/26/2019   BUN 12 07/26/2019   CO2 26 07/26/2019   TSH 6.47 (H) 07/26/2019   HGBA1C 5.5 05/11/2018    Lab Results  Component Value Date   TSH 6.47 (H) 07/26/2019   Lab Results  Component Value Date   WBC 8.9 07/26/2019   HGB 14.4 07/26/2019   HCT 42.5 07/26/2019   MCV 88.0 07/26/2019   PLT 284.0 07/26/2019   Lab Results  Component Value Date   NA 139 07/26/2019   K 3.9 07/26/2019   CO2 26 07/26/2019   GLUCOSE 119 (H) 07/26/2019   BUN 12 07/26/2019   CREATININE 1.11 07/26/2019   BILITOT 0.5 01/14/2019   ALKPHOS 54 01/14/2019   AST 18 01/14/2019   ALT 21 01/14/2019    PROT 6.9 01/14/2019   ALBUMIN 4.1 01/14/2019   CALCIUM 9.2 07/26/2019   GFR 51.71 (L) 07/26/2019   Lab Results  Component Value Date   CHOL 234 (H) 05/11/2018   Lab Results  Component Value Date   HDL 52.20 05/11/2018   Lab Results  Component Value Date   LDLCALC 156 (H) 05/11/2018   Lab Results  Component Value Date   TRIG 128.0 05/11/2018   Lab Results  Component Value Date   CHOLHDL 4 05/11/2018   Lab Results  Component Value Date   HGBA1C 5.5 05/11/2018       Assessment & Plan:   Problem List Items Addressed This Visit    Hypothyroid    On Levothyroxine, continue to monitor      Vitamin B 12 deficiency    Supplement and monitor      Relevant Orders   Vitamin B12   Comprehensive metabolic panel   CBC   Depression with anxiety    stable on Sertraline. She is doing well on current dose.       Hyperlipidemia    Encouraged heart healthy diet, increase exercise, avoid trans fats, consider a krill oil cap daily      Hyperglycemia    hgba1c acceptable, minimize simple carbs. Increase exercise as tolerated. Repeat labs      Relevant Orders   Comprehensive metabolic panel   CBC   Hemoglobin A1c   Vitamin D deficiency    Supplement and monitor      Relevant Orders   VITAMIN D 25 Hydroxy (  Vit-D Deficiency, Fractures)   Comprehensive metabolic panel   CBC    Other Visit Diagnoses    Abnormal TSH    -  Primary   Relevant Orders   Comprehensive metabolic panel   CBC   TSH      I have discontinued [name] "Kim"'s methocarbamol and predniSONE. I am also having her maintain her levonorgestrel, Omega-3 Fatty Acids (OMEGA 3 PO), Biotin, buPROPion, cyanocobalamin, B-D 3CC LUER-LOK SYR 25GX1", levothyroxine, and sertraline.  No orders of the defined types were placed in this encounter.  I discussed the assessment and treatment plan with the patient. The patient was provided an opportunity to ask questions and all were answered. The  patient agreed with the plan and demonstrated an understanding of the instructions.   The patient was advised to call back or seek an in-person evaluation if the symptoms worsen or if the condition fails to improve as anticipated.  I provided 20 minutes of non-face-to-face time during this encounter.    Danise Edge, MD

## 2019-09-29 NOTE — Assessment & Plan Note (Signed)
Supplement and monitor 

## 2019-09-29 NOTE — Assessment & Plan Note (Addendum)
stable on Sertraline. She is doing well on current dose.

## 2019-09-29 NOTE — Assessment & Plan Note (Signed)
On Levothyroxine, continue to monitor 

## 2019-09-29 NOTE — Assessment & Plan Note (Signed)
hgba1c acceptable, minimize simple carbs. Increase exercise as tolerated. Repeat labs

## 2019-09-29 NOTE — Assessment & Plan Note (Signed)
Encouraged heart healthy diet, increase exercise, avoid trans fats, consider a krill oil cap daily 

## 2019-10-22 ENCOUNTER — Other Ambulatory Visit: Payer: Self-pay | Admitting: Family Medicine

## 2019-12-10 ENCOUNTER — Other Ambulatory Visit: Payer: BC Managed Care – PPO

## 2020-04-10 ENCOUNTER — Encounter: Payer: Self-pay | Admitting: Family Medicine

## 2020-04-10 ENCOUNTER — Other Ambulatory Visit: Payer: Self-pay

## 2020-04-10 ENCOUNTER — Ambulatory Visit (INDEPENDENT_AMBULATORY_CARE_PROVIDER_SITE_OTHER): Payer: 59 | Admitting: Family Medicine

## 2020-04-10 VITALS — BP 134/82 | HR 108 | Temp 96.9°F | Resp 15 | Wt 262.6 lb

## 2020-04-10 DIAGNOSIS — E538 Deficiency of other specified B group vitamins: Secondary | ICD-10-CM

## 2020-04-10 DIAGNOSIS — Z30431 Encounter for routine checking of intrauterine contraceptive device: Secondary | ICD-10-CM

## 2020-04-10 DIAGNOSIS — Z0000 Encounter for general adult medical examination without abnormal findings: Secondary | ICD-10-CM

## 2020-04-10 DIAGNOSIS — F418 Other specified anxiety disorders: Secondary | ICD-10-CM

## 2020-04-10 DIAGNOSIS — E559 Vitamin D deficiency, unspecified: Secondary | ICD-10-CM | POA: Diagnosis not present

## 2020-04-10 DIAGNOSIS — L578 Other skin changes due to chronic exposure to nonionizing radiation: Secondary | ICD-10-CM | POA: Diagnosis not present

## 2020-04-10 DIAGNOSIS — E785 Hyperlipidemia, unspecified: Secondary | ICD-10-CM

## 2020-04-10 DIAGNOSIS — M25521 Pain in right elbow: Secondary | ICD-10-CM | POA: Insufficient documentation

## 2020-04-10 DIAGNOSIS — G4733 Obstructive sleep apnea (adult) (pediatric): Secondary | ICD-10-CM

## 2020-04-10 DIAGNOSIS — Z23 Encounter for immunization: Secondary | ICD-10-CM

## 2020-04-10 DIAGNOSIS — E039 Hypothyroidism, unspecified: Secondary | ICD-10-CM

## 2020-04-10 DIAGNOSIS — L989 Disorder of the skin and subcutaneous tissue, unspecified: Secondary | ICD-10-CM

## 2020-04-10 DIAGNOSIS — Z1239 Encounter for other screening for malignant neoplasm of breast: Secondary | ICD-10-CM

## 2020-04-10 DIAGNOSIS — E6609 Other obesity due to excess calories: Secondary | ICD-10-CM

## 2020-04-10 LAB — CBC
HCT: 46.4 % — ABNORMAL HIGH (ref 36.0–46.0)
Hemoglobin: 15.6 g/dL — ABNORMAL HIGH (ref 12.0–15.0)
MCHC: 33.7 g/dL (ref 30.0–36.0)
MCV: 86.3 fl (ref 78.0–100.0)
Platelets: 285 10*3/uL (ref 150.0–400.0)
RBC: 5.38 Mil/uL — ABNORMAL HIGH (ref 3.87–5.11)
RDW: 13.2 % (ref 11.5–15.5)
WBC: 8.7 10*3/uL (ref 4.0–10.5)

## 2020-04-10 LAB — COMPREHENSIVE METABOLIC PANEL
ALT: 17 U/L (ref 0–35)
AST: 18 U/L (ref 0–37)
Albumin: 4.1 g/dL (ref 3.5–5.2)
Alkaline Phosphatase: 64 U/L (ref 39–117)
BUN: 9 mg/dL (ref 6–23)
CO2: 28 mEq/L (ref 19–32)
Calcium: 9.4 mg/dL (ref 8.4–10.5)
Chloride: 102 mEq/L (ref 96–112)
Creatinine, Ser: 1.03 mg/dL (ref 0.40–1.20)
GFR: 62.64 mL/min (ref >60.00)
Glucose, Bld: 102 mg/dL — ABNORMAL HIGH (ref 70–99)
Potassium: 4.1 mEq/L (ref 3.5–5.1)
Sodium: 139 mEq/L (ref 135–145)
Total Bilirubin: 0.4 mg/dL (ref 0.2–1.2)
Total Protein: 7.4 g/dL (ref 6.0–8.3)

## 2020-04-10 LAB — LIPID PANEL
Cholesterol: 264 mg/dL — ABNORMAL HIGH (ref 0–200)
HDL: 49.7 mg/dL (ref >39.00)
LDL Cholesterol: 186 mg/dL — ABNORMAL HIGH (ref 0–99)
NonHDL: 214.43
Total CHOL/HDL Ratio: 5
Triglycerides: 144 mg/dL (ref 0.0–149.0)
VLDL: 28.8 mg/dL (ref 0.0–40.0)

## 2020-04-10 LAB — VITAMIN B12: Vitamin B-12: 178 pg/mL — ABNORMAL LOW (ref 211–911)

## 2020-04-10 LAB — TSH: TSH: 9.53 u[IU]/mL — ABNORMAL HIGH (ref 0.35–4.50)

## 2020-04-10 LAB — VITAMIN D 25 HYDROXY (VIT D DEFICIENCY, FRACTURES): VITD: 31.75 ng/mL (ref 30.00–100.00)

## 2020-04-10 NOTE — Assessment & Plan Note (Signed)
Probably repetitive use injury and resolved at present. Report if worsens for possible referral. Ice, topical treatments as needed.

## 2020-04-10 NOTE — Assessment & Plan Note (Addendum)
Patient encouraged to maintain heart healthy diet, regular exercise, adequate sleep. Consider daily probiotics. Take medications as prescribed. Labs ordered and reviewed. Colonoscopy in 2013 due again in 2023. MM ordered. GYN last pap in 2019 but she does need her IUD out so she is referred to GYN for referral. She is about to get her COVID shot

## 2020-04-10 NOTE — Patient Instructions (Signed)

## 2020-04-10 NOTE — Progress Notes (Signed)
Subjective:    Patient ID: Tiffany Morris, female    DOB: December 08, 1965, 54 y.o.   MRN: [id number]  Chief Complaint  Patient presents with  . Annual Exam    HPI Patient is in today for annual preventative exam and follow-up on chronic medical concerns.  She denies any recent febrile illness or hospitalizations.  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  Her mom was recently hospitalized with pneumonia and a DVT.  Her father was seen at urgent care with chest pain and her 67 year old son with Asperger's has decided not to continue going to school although things together have caused a great deal of consternation and stress.  She does note anhedonia but does not endorse suicidal ideation.  She is noting also she had some trouble with right elbow pain last week she thinks it was overuse and it has resolved.  No redness, warmth or trauma was noted.  She acknowledges with the stress she has not been exercising or eating well and is helping to restart her weight watchers program. Denies CP/palp/SOB/HA/congestion/fevers/GI or GU c/o. Taking meds as prescribed  Past Medical History:  Diagnosis Date  . Chicken pox as a child  . Depression with anxiety 08/05/2011  . 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 Hyperglycemia 06/05/2014  . Hyperlipidemia 09/05/2011  . hypothyroidism 06/02/2010  . Obesity   . Pain of left breast 08/05/2011  . Pedal edema 08/05/2011  . Thyroid disease    hyper  . Unspecified sleep apnea 08/20/2013  . Vitamin B 12 deficiency     Past Surgical History:  Procedure Laterality Date  . CESAREAN SECTION  2004  . CHOLECYSTECTOMY     laparoscopic  . COLONOSCOPY  2014  . RETINAL DETACHMENT REPAIR W/ SCLERAL BUCKLE LE  01-25-10    Family History  Problem Relation Age of Onset  . Hyperlipidemia Mother   .  Diabetes Mother        borderline  . Deep vein thrombosis Mother   . Hyperlipidemia Father   . Hypertension Father   . Diabetes Father        type 2  . Hyperthyroidism Father   . Obesity Father   . Cancer Maternal Grandmother        lung- smoker  . COPD Maternal Grandfather        smoked  . Cancer Maternal Grandfather        bladder  . Heart disease Paternal Grandmother        CHF, MI  . Hypothyroidism Paternal Grandmother   . Hypertension Paternal Grandmother   . Hypertension Paternal Grandfather   . Heart disease Paternal Grandfather        open heart surgery  . Hyperlipidemia Paternal Grandfather   . Macular degeneration Paternal Grandfather   . Autism spectrum disorder Son     Social History   Socioeconomic History  . Marital status: Married    Spouse name: Not on file  . Number of children: Not on file  . Years of education: Not on file  . Highest education level: Not on file  Occupational History  . Occupation: NP    Employer: [employer]  Tobacco Use  . Smoking status: Never Smoker  . Smokeless tobacco: Current User  Substance and Sexual Activity  . Alcohol use: No  Alcohol/week: 0.0 standard drinks  . Drug use: No  . Sexual activity: Not Currently    Comment: lives with husband, son age 36, no dietary restrictions.. Nurse Practiitoner with [place]  Other Topics Concern  . 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    Social Determinants of Health   Financial Resource Strain:   . Difficulty of Paying Living Expenses: Not on file  Food Insecurity:   . Worried About Programme researcher, broadcasting/film/video in the Last Year: Not on file  . Ran Out of Food in the Last Year: Not on file  Transportation Needs:   . Lack of Transportation (Medical): Not on file  . Lack of Transportation (Non-Medical): Not on file  Physical Activity:   . Days of Exercise per Week: Not on file  .  Minutes of Exercise per Session: Not on file  Stress:   . Feeling of Stress : Not on file  Social Connections:   . Frequency of Communication with Friends and Family: Not on file  . Frequency of Social Gatherings with Friends and Family: Not on file  . Attends Religious Services: Not on file  . Active Member of Clubs or Organizations: Not on file  . Attends Banker Meetings: Not on file  . Marital Status: Not on file  Intimate Partner Violence:   . Fear of Current or Ex-Partner: Not on file  . Emotionally Abused: Not on file  . Physically Abused: Not on file  . Sexually Abused: Not on file    Outpatient Medications Prior to Visit  Medication Sig Dispense Refill  . Biotin 5000 MCG CAPS Take by mouth.    Marland Kitchen buPROPion (WELLBUTRIN XL) 300 MG 24 hr tablet Take 1 tablet by mouth once daily 90 tablet 1  . cyanocobalamin (,VITAMIN B-12,) 1000 MCG/ML injection INJECT 1 ML INTRAMUSCULARLY ONCE 30 mL 3  . levonorgestrel (MIRENA) 20 MCG/24HR IUD 1 each by Intrauterine route once.    Marland Kitchen levothyroxine (SYNTHROID) 137 MCG tablet TAKE 1 TABLET BY MOUTH ONCE DAILY BEFORE BREAKFAST 30 tablet 4  . Omega-3 Fatty Acids (OMEGA 3 PO) Take by mouth daily.    . sertraline (ZOLOFT) 50 MG tablet Take 1 tablet by mouth once daily 90 tablet 1  . SYRINGE-NEEDLE, DISP, 3 ML (B-D 3CC LUER-LOK SYR 25GX1") 25G X 1" 3 ML MISC USE AS DIRECTED 12 each 1   No facility-administered medications prior to visit.    No Known Allergies  Review of Systems  Constitutional: Negative for chills, fever and malaise/fatigue.  HENT: Negative for congestion and hearing loss.   Eyes: Negative for discharge.  Respiratory: Negative for cough, sputum production and shortness of breath.   Cardiovascular: Negative for chest pain, palpitations and leg swelling.  Gastrointestinal: Negative for abdominal pain, blood in stool, constipation, diarrhea, heartburn, nausea and vomiting.  Genitourinary: Negative for dysuria,  frequency, hematuria and urgency.  Musculoskeletal: Positive for joint pain. Negative for back pain, falls and myalgias.  Skin: Negative for rash.  Neurological: Negative for dizziness, sensory change, loss of consciousness, weakness and headaches.  Endo/Heme/Allergies: Negative for environmental allergies. Does not bruise/bleed easily.  Psychiatric/Behavioral: Positive for depression. Negative for suicidal ideas. The patient is nervous/anxious. The patient does not have insomnia.        Objective:    Physical Exam Constitutional:      General: She is not in acute  distress.    Appearance: She is well-developed.  HENT:     Head: Normocephalic and atraumatic.  Eyes:     Conjunctiva/sclera: Conjunctivae normal.  Neck:     Thyroid: No thyromegaly.  Cardiovascular:     Rate and Rhythm: Normal rate and regular rhythm.     Heart sounds: Normal heart sounds. No murmur heard.   Pulmonary:     Effort: Pulmonary effort is normal. No respiratory distress.     Breath sounds: Normal breath sounds.  Abdominal:     General: Bowel sounds are normal. There is no distension.     Palpations: Abdomen is soft. There is no mass.     Tenderness: There is no abdominal tenderness.  Musculoskeletal:     Cervical back: Neck supple.  Lymphadenopathy:     Cervical: No cervical adenopathy.  Skin:    General: Skin is warm and dry.  Neurological:     Mental Status: She is alert and oriented to person, place, and time.  Psychiatric:        Behavior: Behavior normal.     BP 134/82 (BP Location: Left Arm, Patient Position: Sitting, Cuff Size: Large)   Pulse (!) 108   Temp (!) 96.9 F (36.1 C) (Temporal)   Resp 15   Wt 262 lb 9.6 oz (119.1 kg)   LMP  (LMP Unknown)   SpO2 98%   BMI 45.08 kg/m  Wt Readings from Last 3 Encounters:  04/10/20 262 lb 9.6 oz (119.1 kg)  09/27/19 253 lb (114.8 kg)  07/26/19 254 lb (115.2 kg)    Diabetic Foot Exam - Simple   No data filed     Lab Results    Component Value Date   WBC 8.9 07/26/2019   HGB 14.4 07/26/2019   HCT 42.5 07/26/2019   PLT 284.0 07/26/2019   GLUCOSE 119 (H) 07/26/2019   CHOL 234 (H) 05/11/2018   TRIG 128.0 05/11/2018   HDL 52.20 05/11/2018   LDLDIRECT 168.3 06/02/2014   LDLCALC 156 (H) 05/11/2018   ALT 21 01/14/2019   AST 18 01/14/2019   NA 139 07/26/2019   K 3.9 07/26/2019   CL 105 07/26/2019   CREATININE 1.11 07/26/2019   BUN 12 07/26/2019   CO2 26 07/26/2019   TSH 6.47 (H) 07/26/2019   HGBA1C 5.5 05/11/2018    Lab Results  Component Value Date   TSH 6.47 (H) 07/26/2019   Lab Results  Component Value Date   WBC 8.9 07/26/2019   HGB 14.4 07/26/2019   HCT 42.5 07/26/2019   MCV 88.0 07/26/2019   PLT 284.0 07/26/2019   Lab Results  Component Value Date   NA 139 07/26/2019   K 3.9 07/26/2019   CO2 26 07/26/2019   GLUCOSE 119 (H) 07/26/2019   BUN 12 07/26/2019   CREATININE 1.11 07/26/2019   BILITOT 0.5 01/14/2019   ALKPHOS 54 01/14/2019   AST 18 01/14/2019   ALT 21 01/14/2019   PROT 6.9 01/14/2019   ALBUMIN 4.1 01/14/2019   CALCIUM 9.2 07/26/2019   GFR 51.71 (L) 07/26/2019   Lab Results  Component Value Date   CHOL 234 (H) 05/11/2018   Lab Results  Component Value Date   HDL 52.20 05/11/2018   Lab Results  Component Value Date   LDLCALC 156 (H) 05/11/2018   Lab Results  Component Value Date   TRIG 128.0 05/11/2018   Lab Results  Component Value Date   CHOLHDL 4 05/11/2018   Lab Results  Component Value  Date   HGBA1C 5.5 05/11/2018       Assessment & Plan:   Problem List Items Addressed This Visit    Hypothyroid    On Levothyroxine, continue to monitor      Relevant Orders   TSH   Vitamin B 12 deficiency    Supplement and monitor      Relevant Orders   CBC   Vitamin B12   Intrinsic Factor Antibodies   Obesity   Depression with anxiety    Feels like her meds are helping enough. Her mom just was in the hospital with pneumonia and DVT and Dad had CP. Her  son with Asperberger's has stopped going to school. No changes      Preventative health care    Patient encouraged to maintain heart healthy diet, regular exercise, adequate sleep. Consider daily probiotics. Take medications as prescribed. Labs ordered and reviewed. Colonoscopy in 2013 due again in 2023. MM ordered. GYN last pap in 2019 but she does need her IUD out so she is referred to GYN for referral. She is about to get her COVID shot      Hyperlipidemia    Encouraged heart healthy diet, increase exercise, avoid trans fats, consider a krill oil cap daily      Relevant Orders   Lipid panel   OSA (obstructive sleep apnea)    Using CPAP regularly      Relevant Orders   Ambulatory referral to Pulmonology   Vitamin D deficiency    Supplement and monitor      Relevant Orders   Comprehensive metabolic panel   VITAMIN D 25 Hydroxy (Vit-D Deficiency, Fractures)   Right elbow pain    Probably repetitive use injury and resolved at present. Report if worsens for possible referral. Ice, topical treatments as needed.        Other Visit Diagnoses    Need for prophylactic vaccination with diphtheria-tetanus-pertussis with typhoid-paratyphoid (DTP + TAB) vaccine    -  Primary   Relevant Orders   Tdap vaccine greater than or equal to 7yo IM   Encounter for screening for malignant neoplasm of breast, unspecified screening modality       Relevant Orders   MM 3D SCREEN BREAST BILATERAL   Skin lesion of face       Relevant Orders   Ambulatory referral to Dermatology   Sun-damaged skin       Relevant Orders   Ambulatory referral to Dermatology   IUD check up       Relevant Orders   Ambulatory referral to Obstetrics / Gynecology      I am having [name] "Kim" maintain her levonorgestrel, Omega-3 Fatty Acids (OMEGA 3 PO), Biotin, cyanocobalamin, B-D 3CC LUER-LOK SYR 25GX1", levothyroxine, sertraline, and buPROPion.  No orders of the defined types were placed in this  encounter.    Danise Edge, MD

## 2020-04-10 NOTE — Assessment & Plan Note (Signed)
Using CPAP regularly

## 2020-04-10 NOTE — Assessment & Plan Note (Signed)
On Levothyroxine, continue to monitor 

## 2020-04-10 NOTE — Assessment & Plan Note (Signed)
Supplement and monitor 

## 2020-04-10 NOTE — Assessment & Plan Note (Signed)
Feels like her meds are helping enough. Her mom just was in the hospital with pneumonia and DVT and Dad had CP. Her son with Asperberger's has stopped going to school. No changes

## 2020-04-10 NOTE — Assessment & Plan Note (Signed)
Encouraged heart healthy diet, increase exercise, avoid trans fats, consider a krill oil cap daily 

## 2020-04-11 ENCOUNTER — Telehealth: Payer: Self-pay

## 2020-04-11 ENCOUNTER — Other Ambulatory Visit: Payer: Self-pay

## 2020-04-11 DIAGNOSIS — R7989 Other specified abnormal findings of blood chemistry: Secondary | ICD-10-CM

## 2020-04-11 DIAGNOSIS — E538 Deficiency of other specified B group vitamins: Secondary | ICD-10-CM

## 2020-04-11 MED ORDER — CYANOCOBALAMIN 1000 MCG/ML IJ SOLN: INTRAMUSCULAR | 8 refills | Status: DC

## 2020-04-11 MED ORDER — LEVOTHYROXINE SODIUM 150 MCG PO TABS: ORAL_TABLET | ORAL | 0 refills | Status: DC

## 2020-04-11 MED ORDER — "BD LUER-LOK SYRINGE 25G X 1"" 3 ML MISC": 1 refills | Status: DC

## 2020-04-11 NOTE — Telephone Encounter (Signed)
-----   Message from Bradd Canary, MD sent at 04/10/2020  7:25 PM EST ----- Vitamin D is normal but low normal so increase intake by 1000 IU daily. Her TSH is up so we need to increase the strength of her Levothyroxine dose needs to increase to 150 mcg daily, disp #30 with 3 rf. Also vitamin B 12 is lower. Please restart Cyanobalamin 1000 mcg shots, use 1 shot weekly x 8 doses and when the intrinsic factor is positive she will need to stay on shots if negative she could switch to 1000 mcg under tongue daily as we discussed. Hgb is slightly high and increasing water intake can help that. Repeat labs in 3 months

## 2020-04-11 NOTE — Telephone Encounter (Signed)
Attempted to call unable to leave message. Mychart message sent to pt with providers instructions, Pt needs to schedule 3 month lab f/u appointment

## 2020-04-12 LAB — INTRINSIC FACTOR ANTIBODIES: Intrinsic Factor: NEGATIVE

## 2020-04-13 NOTE — Progress Notes (Signed)
Pt is aware of lab results.

## 2020-04-14 ENCOUNTER — Other Ambulatory Visit: Payer: Self-pay

## 2020-04-14 DIAGNOSIS — E538 Deficiency of other specified B group vitamins: Secondary | ICD-10-CM

## 2020-04-14 MED ORDER — CYANOCOBALAMIN 1000 MCG/ML IJ SOLN: INTRAMUSCULAR | 8 refills | Status: DC

## 2020-05-31 ENCOUNTER — Telehealth: Payer: Self-pay | Admitting: *Deleted

## 2020-05-31 NOTE — Telephone Encounter (Signed)
[facility] dermatology sent a fax stated that they have been unable to contact patient for appointment .  Tried calling to see what is going on.  No answer/vm full on mobile number and left detailed message on home number

## 2020-06-02 ENCOUNTER — Ambulatory Visit: Payer: 59 | Admitting: Family Medicine

## 2020-06-14 ENCOUNTER — Institutional Professional Consult (permissible substitution): Payer: 59 | Admitting: Pulmonary Disease

## 2020-08-15 ENCOUNTER — Other Ambulatory Visit: Payer: Self-pay | Admitting: Family Medicine

## 2020-08-15 DIAGNOSIS — R7989 Other specified abnormal findings of blood chemistry: Secondary | ICD-10-CM

## 2020-08-27 ENCOUNTER — Telehealth: Payer: 59 | Admitting: Nurse Practitioner

## 2020-08-27 ENCOUNTER — Encounter: Payer: Self-pay | Admitting: Family Medicine

## 2020-08-27 DIAGNOSIS — S70261A Insect bite (nonvenomous), right hip, initial encounter: Secondary | ICD-10-CM

## 2020-08-27 DIAGNOSIS — W57XXXA Bitten or stung by nonvenomous insect and other nonvenomous arthropods, initial encounter: Secondary | ICD-10-CM

## 2020-08-27 MED ORDER — DOXYCYCLINE HYCLATE 100 MG PO TABS: 200.0000 mg | ORAL_TABLET | Freq: Once | ORAL | 0 refills | Status: AC

## 2020-08-27 NOTE — Progress Notes (Signed)
Thank you for describing your tick bite, Here is how we plan to help! Based on the information that you shared with me it looks like you have A tick that bite that we will treat with a short course of doxycycline.  In most cases a tick bite is painless and does not itch.  Most tick bites in which the tick is quickly removed do not require prescriptions. Ticks can transmit several diseases if they are infected and remain attacked to your skin. Therefore the length that the tick was attached and any symptoms you have experienced after the bite are import to accurately develop your custom treatment plan. In most cases a single dose of doxycycline may prevent the development of a more serious condition.  Based on your information I have Provided a home care guide for tick bites and  instructions on when to call for help. and I have sent a single dose of doxycycline to the pharmacy you selected. Please make sure that you selected a pharmacy that is open now.  Which ticks  are associated with illness?  The Wood Tick (dog tick) is the size of a watermelon seed and can sometimes transmit [facility] spotted fever and [place] tick fever.   The Deer Tick (black-legged tick) is between the size of a poppy seed (pin head) and an apple seed, and can sometimes transmit Lyme disease.  A brown to black tick with a white splotch on its back is likely a female Amblyomma americanum ([place] tick). This tick has been associated with Southern Tick Associated illness ( STARI)  Lyme disease has become the most common tick-borne illness in the [place]. The risk of Lyme disease following a recognized deer tick bite is estimated to be 1%.  The majority of cases of Lyme disease start with a bull's eye rash at the site of the tick bite. The rash can occur days to weeks (typically 7-10 days) after a tick bite. Treatment with antibiotics is indicated if this rash appears. Flu-like symptoms may accompany the rash,  including: fever, chills, headaches, muscle aches, and fatigue. Removing ticks promptly may prevent tick borne disease.  What can be used to prevent Tick Bites?   Insect repellant with at leas 20% DEET.  Wearing long pants with sock and shoes.  Avoiding tall grass and heavily wooded areas.  Checking your skin after being outdoors.  Shower with a washcloth after outdoor exposures.  HOME CARE ADVICE FOR TICK BITE  1. Wood Tick Removal:  o Use a pair of tweezers and grasp the wood tick close to the skin (on its head). Pull the wood tick straight upward without twisting or crushing it. Maintain a steady pressure until it releases its grip.   o If tweezers aren't available, use fingers, a loop of thread around the jaws, or a needle between the jaws for traction.  o Note: covering the tick with petroleum jelly, nail polish or rubbing alcohol doesn't work. Neither does touching the tick with a hot or cold object. 2. Tiny Deer Tick Removal:   o Needs to be scraped off with a knife blade or credit card edge. o Place tick in a sealed container (e.g. glass jar, zip lock plastic bag), in case your doctor wants to see it. 3. Tick's Head Removal:  o If the wood tick's head breaks off in the skin, it must be removed. Clean the skin. Then use a sterile needle to uncover the head and lift it out or  scrape it off.  o If a very small piece of the head remains, the skin will eventually slough it off. 4. Antibiotic Ointment:  o Wash the wound and your hands with soap and water after removal to prevent catching any tick disease.  Apply an over the counter antibiotic ointment (e.g. bacitracin) to the bite once. 5. Expected Course: Tick bites normally don't itch or hurt. That's why they often go unnoticed. 6. Call Your Doctor If:  o You can't remove the tick or the tick's head o Fever, a severe head ache, or rash occur in the next 2 weeks o Bite begins to look infected o Lyme's disease is common in your  area o You have not had a tetanus in the last 10 years o Your current symptoms become worse    MAKE SURE YOU   Understand these instructions.  Will watch your condition.  Will get help right away if you are not doing well or get worse.   Thank you for choosing an e-visit.  Your e-visit answers were reviewed by a board certified advanced clinical practitioner to complete your personal care plan. Depending upon the condition, your plan could have included both over the counter or prescription medications. Please review your pharmacy choice. If there is a problem you may use MyChart messaging to have the prescription routed to another pharmacy. Your safety is important to us. If you have drug allergies check your prescription carefully.   You can use MyChart to ask questions about today's visit, request a non-urgent call back, or ask for a work or school excuse for 24 hours related to this e-Visit. If it has been greater than 24 hours you will need to follow up with your provider, or enter a new e-Visit to address those concerns.  You will get an email in the next two days asking about your experience. I hope  that your e-visit has been valuable and will speed your recovery   I have spent at least 5 minutes reviewing and documenting in the patient's chart. 

## 2020-08-28 ENCOUNTER — Other Ambulatory Visit: Payer: Self-pay | Admitting: Family Medicine

## 2020-08-28 DIAGNOSIS — W57XXXA Bitten or stung by nonvenomous insect and other nonvenomous arthropods, initial encounter: Secondary | ICD-10-CM

## 2020-08-28 DIAGNOSIS — M791 Myalgia, unspecified site: Secondary | ICD-10-CM

## 2020-08-28 DIAGNOSIS — M545 Low back pain, unspecified: Secondary | ICD-10-CM

## 2020-08-29 NOTE — Telephone Encounter (Signed)
Pt is scheduled for 08/31/20 with lab

## 2020-08-31 ENCOUNTER — Other Ambulatory Visit: Payer: 59

## 2020-09-01 ENCOUNTER — Telehealth: Payer: Self-pay | Admitting: *Deleted

## 2020-09-01 NOTE — Telephone Encounter (Signed)
Just saw this.  It looks like patient was scheduled for 08/31/20 and did not come.  Would recommend any lab work now?

## 2020-09-01 NOTE — Telephone Encounter (Signed)
We do not get labs with every tick bites, mostly when people are having symptoms such as headaches, fever, rash, myalgias. It is up to her if she has them, they are actually slightly more reliably positive or negative if we wait a week or so so it is certainly still reasonable to get them done.

## 2020-09-01 NOTE — Telephone Encounter (Signed)
-----   Message -----  From: [name]  Sent: 09/01/2020 10:18 AM EDT  To: [facility] Pool  Subject: Appointment Scheduled                Yes if I still need them. This is very frustrating. She ordered the labs on Monday and no one scheduled them until Tuesday afternoon at 4:45 after I messaged that no one had scheduled the labs. Now that it's Friday and my tick bite was on Saturday do I even need to have these Labs drawn now?

## 2020-09-04 NOTE — Telephone Encounter (Signed)
Spoke with patient and she is still having joint pain and would like to have labs done.  Appointment made for 09/05/20.

## 2020-09-05 ENCOUNTER — Other Ambulatory Visit: Payer: Self-pay

## 2020-09-05 ENCOUNTER — Other Ambulatory Visit (INDEPENDENT_AMBULATORY_CARE_PROVIDER_SITE_OTHER): Payer: 59

## 2020-09-05 DIAGNOSIS — M791 Myalgia, unspecified site: Secondary | ICD-10-CM | POA: Diagnosis not present

## 2020-09-05 DIAGNOSIS — W57XXXA Bitten or stung by nonvenomous insect and other nonvenomous arthropods, initial encounter: Secondary | ICD-10-CM | POA: Diagnosis not present

## 2020-09-05 DIAGNOSIS — M545 Low back pain, unspecified: Secondary | ICD-10-CM

## 2020-09-09 LAB — EHRLICHIA ANTIBODY PANEL
E. CHAFFEENSIS AB IGG: <1:64 {titer}
E. CHAFFEENSIS AB IGM: <1:20 {titer}

## 2020-09-09 LAB — ROCKY MTN SPOTTED FVR ABS PNL(IGG+IGM)
RMSF IgG: NOT DETECTED
RMSF IgM: NOT DETECTED

## 2020-09-09 LAB — B. BURGDORFI ANTIBODIES: B burgdorferi Ab IgG+IgM: <0.9 index

## 2020-10-09 ENCOUNTER — Ambulatory Visit: Payer: 59 | Admitting: Family Medicine

## 2020-10-16 ENCOUNTER — Encounter: Payer: Self-pay | Admitting: Family Medicine

## 2020-10-16 ENCOUNTER — Other Ambulatory Visit: Payer: Self-pay

## 2020-10-16 ENCOUNTER — Other Ambulatory Visit: Payer: Self-pay | Admitting: Family Medicine

## 2020-10-16 ENCOUNTER — Ambulatory Visit (INDEPENDENT_AMBULATORY_CARE_PROVIDER_SITE_OTHER): Payer: 59 | Admitting: Family Medicine

## 2020-10-16 VITALS — BP 112/72 | HR 83 | Temp 97.5°F | Resp 18 | Wt 267.6 lb

## 2020-10-16 DIAGNOSIS — E039 Hypothyroidism, unspecified: Secondary | ICD-10-CM | POA: Diagnosis not present

## 2020-10-16 DIAGNOSIS — E559 Vitamin D deficiency, unspecified: Secondary | ICD-10-CM

## 2020-10-16 DIAGNOSIS — E785 Hyperlipidemia, unspecified: Secondary | ICD-10-CM

## 2020-10-16 DIAGNOSIS — E538 Deficiency of other specified B group vitamins: Secondary | ICD-10-CM

## 2020-10-16 DIAGNOSIS — R739 Hyperglycemia, unspecified: Secondary | ICD-10-CM

## 2020-10-16 DIAGNOSIS — R1310 Dysphagia, unspecified: Secondary | ICD-10-CM | POA: Diagnosis not present

## 2020-10-16 DIAGNOSIS — E6609 Other obesity due to excess calories: Secondary | ICD-10-CM

## 2020-10-16 DIAGNOSIS — F418 Other specified anxiety disorders: Secondary | ICD-10-CM

## 2020-10-16 LAB — CBC
HCT: 44.2 % (ref 36.0–46.0)
Hemoglobin: 14.8 g/dL (ref 12.0–15.0)
MCHC: 33.5 g/dL (ref 30.0–36.0)
MCV: 88.2 fl (ref 78.0–100.0)
Platelets: 272 10*3/uL (ref 150.0–400.0)
RBC: 5.01 Mil/uL (ref 3.87–5.11)
RDW: 13.2 % (ref 11.5–15.5)
WBC: 7.8 10*3/uL (ref 4.0–10.5)

## 2020-10-16 LAB — COMPREHENSIVE METABOLIC PANEL
ALT: 17 U/L (ref 0–35)
AST: 15 U/L (ref 0–37)
Albumin: 4.1 g/dL (ref 3.5–5.2)
Alkaline Phosphatase: 57 U/L (ref 39–117)
BUN: 13 mg/dL (ref 6–23)
CO2: 28 mEq/L (ref 19–32)
Calcium: 9.6 mg/dL (ref 8.4–10.5)
Chloride: 104 mEq/L (ref 96–112)
Creatinine, Ser: 1.01 mg/dL (ref 0.40–1.20)
GFR: 63.9 mL/min (ref >60.00)
Glucose, Bld: 92 mg/dL (ref 70–99)
Potassium: 4.4 mEq/L (ref 3.5–5.1)
Sodium: 140 mEq/L (ref 135–145)
Total Bilirubin: 0.5 mg/dL (ref 0.2–1.2)
Total Protein: 7.5 g/dL (ref 6.0–8.3)

## 2020-10-16 LAB — LIPID PANEL
Cholesterol: 256 mg/dL — ABNORMAL HIGH (ref 0–200)
HDL: 46 mg/dL (ref >39.00)
LDL Cholesterol: 176 mg/dL — ABNORMAL HIGH (ref 0–99)
NonHDL: 210.26
Total CHOL/HDL Ratio: 6
Triglycerides: 170 mg/dL — ABNORMAL HIGH (ref 0.0–149.0)
VLDL: 34 mg/dL (ref 0.0–40.0)

## 2020-10-16 LAB — VITAMIN B12: Vitamin B-12: 133 pg/mL — ABNORMAL LOW (ref 211–911)

## 2020-10-16 LAB — VITAMIN D 25 HYDROXY (VIT D DEFICIENCY, FRACTURES): VITD: 27.14 ng/mL — ABNORMAL LOW (ref 30.00–100.00)

## 2020-10-16 LAB — T4, FREE: Free T4: 0.5 ng/dL — ABNORMAL LOW (ref 0.60–1.60)

## 2020-10-16 LAB — TSH: TSH: 17.46 u[IU]/mL — ABNORMAL HIGH (ref 0.35–4.50)

## 2020-10-16 MED ORDER — LEVOTHYROXINE SODIUM 175 MCG PO TABS: 175.0000 ug | ORAL_TABLET | Freq: Every day | ORAL | 1 refills | Status: DC

## 2020-10-16 NOTE — Assessment & Plan Note (Signed)
Encouraged heart healthy diet, increase exercise, avoid trans fats, consider a krill oil cap daily 

## 2020-10-16 NOTE — Assessment & Plan Note (Signed)
Encouraged DASH or MIND diet, decrease po intake and increase exercise as tolerated. Needs 7-8 hours of sleep nightly. Avoid trans fats, eat small, frequent meals every 4-5 hours with lean proteins, complex carbs and healthy fats. Minimize simple carbs, she is set up on [employer] but does not utilize the APP much.

## 2020-10-16 NOTE — Assessment & Plan Note (Addendum)
On Levothyroxine, continue to monitor.TSH elevated on last check

## 2020-10-16 NOTE — Assessment & Plan Note (Signed)
hgba1c acceptable, minimize simple carbs. Increase exercise as tolerated.  

## 2020-10-16 NOTE — Assessment & Plan Note (Signed)
She is not sure how much the Sertraline helped because her aunt who helped raise her died on Easter so she cannot tell what is grief and what is depression. She agrees to access a counseling program through her work and to wait 3 months to talk again and reassess but she will contact us if she feels she needs to make changes sooner.

## 2020-10-16 NOTE — Progress Notes (Signed)
Subjective:    Patient ID: Tiffany Morris, female    DOB: March 26, 1966, 55 y.o.   MRN: [id number]  Chief Complaint  Patient presents with  . Follow-up  . Hyperlipidemia    HPI Patient is in today for follow-up on chronic medical concerns including depression, anxiety and hypothyroidism.  We prescribed sertraline 50 mg at her last visit due to increasing anhedonia and anxiety but unfortunately on Easter Sunday her aunt who helped to raise her diet and so she is unclear how much the medication has helped versus the grieving process is overwhelming her.  No suicidal ideation but she does acknowledge she has been not taking great care of her self.  No significant exercise she notes shortness of breath with minimal exertion at this point.  She has signed up for weight watchers but has not really been using it.  She is considering a Company secretary so she can get to the gym.  She denies any recent febrile illness or hospitalizations.  No recent other acute concerns noted. Denies CP/palp/HA/congestion/fevers/GI or GU c/o. Taking meds as prescribed  Past Medical History:  Diagnosis Date  . Chicken pox as a child  . Depression with anxiety 08/05/2011  . 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 Hyperglycemia 06/05/2014  . Hyperlipidemia 09/05/2011  . hypothyroidism 06/02/2010  . Obesity   . Pain of left breast 08/05/2011  . Pedal edema 08/05/2011  . Thyroid disease    hyper  . Unspecified sleep apnea 08/20/2013  . Vitamin B 12 deficiency     Past Surgical History:  Procedure Laterality Date  . CESAREAN SECTION  2004  . CHOLECYSTECTOMY     laparoscopic  . COLONOSCOPY  2014  . RETINAL DETACHMENT REPAIR W/ SCLERAL BUCKLE LE  01-25-10    Family History  Problem Relation Age of Onset  . Hyperlipidemia Mother   . Diabetes Mother        borderline  . Deep vein thrombosis Mother   . Hyperlipidemia Father    . Hypertension Father   . Diabetes Father        type 2  . Hyperthyroidism Father   . Obesity Father   . Cancer Maternal Grandmother        lung- smoker  . COPD Maternal Grandfather        smoked  . Cancer Maternal Grandfather        bladder  . Heart disease Paternal Grandmother        CHF, MI  . Hypothyroidism Paternal Grandmother   . Hypertension Paternal Grandmother   . Hypertension Paternal Grandfather   . Heart disease Paternal Grandfather        open heart surgery  . Hyperlipidemia Paternal Grandfather   . Macular degeneration Paternal Grandfather   . Autism spectrum disorder Son     Social History   Socioeconomic History  . Marital status: Married    Spouse name: Not on file  . Number of children: Not on file  . Years of education: Not on file  . Highest education level: Not on file  Occupational History  . Occupation: NP    Employer: [employer]vertising [employer]  Tobacco Use  . Smoking status: Never Smoker  . Smokeless tobacco: Current User  Substance and Sexual Activity  . Alcohol use: No    Alcohol/week: 0.0 standard drinks  . Drug use: No  . Sexual activity: Not  Currently    Comment: lives with husband, son age 53, no dietary restrictions.. Nurse Practiitoner with [employer]  Other Topics Concern  . 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    Social Determinants of Health   Financial Resource Strain: Not on file  Food Insecurity: Not on file  Transportation Needs: Not on file  Physical Activity: Not on file  Stress: Not on file  Social Connections: Not on file  Intimate Partner Violence: Not on file    Outpatient Medications Prior to Visit  Medication Sig Dispense Refill  . buPROPion (WELLBUTRIN XL) 300 MG 24 hr tablet Take 1 tablet by mouth once daily 90 tablet 0  . EUTHYROX 150 MCG tablet TAKE 1 TABLET BY MOUTH ONCE DAILY BEFORE BREAKFAST 90 tablet 0  . levonorgestrel  (MIRENA) 20 MCG/24HR IUD 1 each by Intrauterine route once.    . Omega-3 Fatty Acids (OMEGA 3 PO) Take by mouth daily.    . sertraline (ZOLOFT) 50 MG tablet Take 1 tablet by mouth once daily 90 tablet 0  . Biotin 5000 MCG CAPS Take by mouth.    . cyanocobalamin (,VITAMIN B-12,) 1000 MCG/ML injection INJECT 1 ML INTRAMUSCULARLY ONCE WEEKLY TIMES 8 WEEKS. 30 mL 8  . SYRINGE-NEEDLE, DISP, 3 ML (B-D 3CC LUER-LOK SYR 25GX1") 25G X 1" 3 ML MISC USE AS DIRECTED 12 each 1   No facility-administered medications prior to visit.    No Known Allergies  Review of Systems  Constitutional: Positive for malaise/fatigue. Negative for fever.  HENT: Negative for congestion.   Eyes: Negative for blurred vision.  Respiratory: Positive for shortness of breath.   Cardiovascular: Negative for chest pain, palpitations and leg swelling.  Gastrointestinal: Negative for abdominal pain, blood in stool and nausea.  Genitourinary: Negative for dysuria and frequency.  Musculoskeletal: Negative for falls.  Skin: Negative for rash.  Neurological: Negative for dizziness, loss of consciousness and headaches.  Endo/Heme/Allergies: Negative for environmental allergies.  Psychiatric/Behavioral: Positive for depression. The patient is nervous/anxious.        Objective:    Physical Exam Vitals and nursing note reviewed.  Constitutional:      General: She is not in acute distress.    Appearance: She is well-developed.  HENT:     Head: Normocephalic and atraumatic.     Nose: Nose normal.  Eyes:     General:        Right eye: No discharge.        Left eye: No discharge.  Cardiovascular:     Rate and Rhythm: Normal rate and regular rhythm.     Heart sounds: No murmur heard.   Pulmonary:     Effort: Pulmonary effort is normal.     Breath sounds: Normal breath sounds.  Abdominal:     General: Bowel sounds are normal.     Palpations: Abdomen is soft.     Tenderness: There is no abdominal tenderness.   Musculoskeletal:     Cervical back: Normal range of motion and neck supple.  Skin:    General: Skin is warm and dry.  Neurological:     Mental Status: She is alert and oriented to person, place, and time.     BP 112/72   Pulse 83   Temp (!) 97.5 F (36.4 C)   Resp 18   Wt 267 lb 9.6 oz (121.4 kg)   SpO2  97%   BMI 45.93 kg/m  Wt Readings from Last 3 Encounters:  10/16/20 267 lb 9.6 oz (121.4 kg)  04/10/20 262 lb 9.6 oz (119.1 kg)  09/27/19 253 lb (114.8 kg)    Diabetic Foot Exam - Simple   No data filed    Lab Results  Component Value Date   WBC 8.7 04/10/2020   HGB 15.6 (H) 04/10/2020   HCT 46.4 (H) 04/10/2020   PLT 285.0 04/10/2020   GLUCOSE 102 (H) 04/10/2020   CHOL 264 (H) 04/10/2020   TRIG 144.0 04/10/2020   HDL 49.70 04/10/2020   LDLDIRECT 168.3 06/02/2014   LDLCALC 186 (H) 04/10/2020   ALT 17 04/10/2020   AST 18 04/10/2020   NA 139 04/10/2020   K 4.1 04/10/2020   CL 102 04/10/2020   CREATININE 1.03 04/10/2020   BUN 9 04/10/2020   CO2 28 04/10/2020   TSH 9.53 (H) 04/10/2020   HGBA1C 5.5 05/11/2018    Lab Results  Component Value Date   TSH 9.53 (H) 04/10/2020   Lab Results  Component Value Date   WBC 8.7 04/10/2020   HGB 15.6 (H) 04/10/2020   HCT 46.4 (H) 04/10/2020   MCV 86.3 04/10/2020   PLT 285.0 04/10/2020   Lab Results  Component Value Date   NA 139 04/10/2020   K 4.1 04/10/2020   CO2 28 04/10/2020   GLUCOSE 102 (H) 04/10/2020   BUN 9 04/10/2020   CREATININE 1.03 04/10/2020   BILITOT 0.4 04/10/2020   ALKPHOS 64 04/10/2020   AST 18 04/10/2020   ALT 17 04/10/2020   PROT 7.4 04/10/2020   ALBUMIN 4.1 04/10/2020   CALCIUM 9.4 04/10/2020   GFR 62.64 04/10/2020   Lab Results  Component Value Date   CHOL 264 (H) 04/10/2020   Lab Results  Component Value Date   HDL 49.70 04/10/2020   Lab Results  Component Value Date   LDLCALC 186 (H) 04/10/2020   Lab Results  Component Value Date   TRIG 144.0 04/10/2020   Lab Results   Component Value Date   CHOLHDL 5 04/10/2020   Lab Results  Component Value Date   HGBA1C 5.5 05/11/2018       Assessment & Plan:   Problem List Items Addressed This Visit    Hypothyroid    On Levothyroxine, continue to monitor.TSH elevated on last check      Relevant Orders   TSH   T4, free   Vitamin B 12 deficiency    Supplement and monitor      Relevant Orders   Vitamin B12   Obesity    Encouraged DASH or MIND diet, decrease po intake and increase exercise as tolerated. Needs 7-8 hours of sleep nightly. Avoid trans fats, eat small, frequent meals every 4-5 hours with lean proteins, complex carbs and healthy fats. Minimize simple carbs, she is set up on [employer] but does not utilize the APP much.       Depression with anxiety    She is not sure how much the Sertraline helped because her aunt who helped raise her died on Easter so she cannot tell what is grief and what is depression. She agrees to access a counseling program through her work and to wait 3 months to talk again and reassess but she will contact us if she feels she needs to make changes sooner.       Hyperlipidemia    Encouraged heart healthy diet, increase exercise, avoid trans fats, consider a krill  oil cap daily      Relevant Orders   Comprehensive metabolic panel   Lipid panel   Hyperglycemia    hgba1c acceptable, minimize simple carbs. Increase exercise as tolerated.       Vitamin D deficiency    Supplement and monitor      Relevant Orders   VITAMIN D 25 Hydroxy (Vit-D Deficiency, Fractures)    Other Visit Diagnoses    Dysphagia, unspecified type    -  Primary   Relevant Orders   Ambulatory referral to Gastroenterology   CBC      I have discontinued [name] "Kim"'s Biotin, B-D 3CC LUER-LOK SYR 25GX1", and cyanocobalamin. I am also having her maintain her levonorgestrel, Omega-3 Fatty Acids (OMEGA 3 PO), sertraline, buPROPion, and Euthyrox.  No orders of the defined types were  placed in this encounter.    Danise Edge, MD

## 2020-10-16 NOTE — Patient Instructions (Signed)
Tiffany Morris is the new weight loss medicine let us know if you are ready to try it [email or web address]">   MIND diet or Cooking Light meal plans or [employer]  DASH Eating Plan DASH stands for Dietary Approaches to Stop Hypertension. The DASH eating plan is a healthy eating plan that has been shown to:  Reduce high blood pressure (hypertension).  Reduce your risk for type 2 diabetes, heart disease, and stroke.  Help with weight loss. What are tips for following this plan? Reading food labels  Check food labels for the amount of salt (sodium) per serving. Choose foods with less than 5 percent of the Daily Value of sodium. Generally, foods with less than 300 milligrams (mg) of sodium per serving fit into this eating plan.  To find whole grains, look for the word "whole" as the first word in the ingredient list. Shopping  Buy products labeled as "low-sodium" or "no salt added."  Buy fresh foods. Avoid canned foods and pre-made or frozen meals. Cooking  Avoid adding salt when cooking. Use salt-free seasonings or herbs instead of table salt or sea salt. Check with your health care provider or pharmacist before using salt substitutes.  Do not fry foods. Cook foods using healthy methods such as baking, boiling, grilling, roasting, and broiling instead.  Cook with heart-healthy oils, such as olive, canola, avocado, soybean, or sunflower oil. Meal planning  Eat a balanced diet that includes: ? 4 or more servings of fruits and 4 or more servings of vegetables each day. Try to fill one-half of your plate with fruits and vegetables. ? 6-8 servings of whole grains each day. ? Less than 6 oz (170 g) of lean meat, poultry, or fish each day. A 3-oz (85-g) serving of meat is about the same size as a deck of cards. One egg equals 1 oz (28 g). ? 2-3 servings of low-fat dairy each day. One serving is 1 cup (237 mL). ? 1 serving of nuts, seeds, or beans 5 times each  week. ? 2-3 servings of heart-healthy fats. Healthy fats called omega-3 fatty acids are found in foods such as walnuts, flaxseeds, fortified milks, and eggs. These fats are also found in cold-water fish, such as sardines, salmon, and mackerel.  Limit how much you eat of: ? Canned or prepackaged foods. ? Food that is high in trans fat, such as some fried foods. ? Food that is high in saturated fat, such as fatty meat. ? Desserts and other sweets, sugary drinks, and other foods with added sugar. ? Full-fat dairy products.  Do not salt foods before eating.  Do not eat more than 4 egg yolks a week.  Try to eat at least 2 vegetarian meals a week.  Eat more home-cooked food and less restaurant, buffet, and fast food.   Lifestyle  When eating at a restaurant, ask that your food be prepared with less salt or no salt, if possible.  If you drink alcohol: ? Limit how much you use to:  0-1 drink a day for women who are not pregnant.  0-2 drinks a day for men. ? Be aware of how much alcohol is in your drink. In the [place] one drink equals one 12 oz bottle of beer (355 mL), one 5 oz glass of wine (148 mL), or one 1 oz glass of hard liquor (44 mL). General information  Avoid eating more than 2,300 mg of salt a day. If you have hypertension, you may need to reduce your sodium intake to  1,500 mg a day.  Work with your health care provider to maintain a healthy body weight or to lose weight. Ask what an ideal weight is for you.  Get at least 30 minutes of exercise that causes your heart to beat faster (aerobic exercise) most days of the week. Activities may include walking, swimming, or biking.  Work with your health care provider or dietitian to adjust your eating plan to your individual calorie needs. What foods should I eat? Fruits All fresh, dried, or frozen fruit. Canned fruit in natural juice (without added sugar). Vegetables Fresh or frozen vegetables (raw, steamed, roasted, or  grilled). Low-sodium or reduced-sodium tomato and vegetable juice. Low-sodium or reduced-sodium tomato sauce and tomato paste. Low-sodium or reduced-sodium canned vegetables. Grains Whole-grain or whole-wheat bread. Whole-grain or whole-wheat pasta. Brown rice. Tiffany Morris. Bulgur. Whole-grain and low-sodium cereals. Pita bread. Low-fat, low-sodium crackers. Whole-wheat flour tortillas. Meats and other proteins Skinless chicken or [place]. Ground chicken or [place]. Pork with fat trimmed off. Fish and seafood. Egg whites. Dried beans, peas, or lentils. Unsalted nuts, nut butters, and seeds. Unsalted canned beans. Lean cuts of beef with fat trimmed off. Low-sodium, lean precooked or cured meat, such as sausages or meat loaves. Dairy Low-fat (1%) or fat-free (skim) milk. Reduced-fat, low-fat, or fat-free cheeses. Nonfat, low-sodium ricotta or cottage cheese. Low-fat or nonfat yogurt. Low-fat, low-sodium cheese. Fats and oils Soft margarine without trans fats. Vegetable oil. Reduced-fat, low-fat, or light mayonnaise and salad dressings (reduced-sodium). Canola, safflower, olive, avocado, soybean, and sunflower oils. Avocado. Seasonings and condiments Herbs. Spices. Seasoning mixes without salt. Other foods Unsalted popcorn and pretzels. Fat-free sweets. The items listed above may not be a complete list of foods and beverages you can eat. Contact a dietitian for more information. What foods should I avoid? Fruits Canned fruit in a light or heavy syrup. Fried fruit. Fruit in cream or butter sauce. Vegetables Creamed or fried vegetables. Vegetables in a cheese sauce. Regular canned vegetables (not low-sodium or reduced-sodium). Regular canned tomato sauce and paste (not low-sodium or reduced-sodium). Regular tomato and vegetable juice (not low-sodium or reduced-sodium). Tiffany Morris. Olives. Grains Baked goods made with fat, such as croissants, muffins, or some breads. Dry pasta or rice meal packs. Meats  and other proteins Fatty cuts of meat. Ribs. Fried meat. Tiffany Morris. Bologna, salami, and other precooked or cured meats, such as sausages or meat loaves. Fat from the back of a pig (fatback). Bratwurst. Salted nuts and seeds. Canned beans with added salt. Canned or smoked fish. Whole eggs or egg yolks. Chicken or [place] with skin. Dairy Whole or 2% milk, cream, and half-and-half. Whole or full-fat cream cheese. Whole-fat or sweetened yogurt. Full-fat cheese. Nondairy creamers. Whipped toppings. Processed cheese and cheese spreads. Fats and oils Butter. Stick margarine. Lard. Shortening. Ghee. Bacon fat. Tropical oils, such as coconut, palm kernel, or palm oil. Seasonings and condiments Onion salt, garlic salt, seasoned salt, table salt, and sea salt. Worcestershire sauce. Tartar sauce. Barbecue sauce. Teriyaki sauce. Soy sauce, including reduced-sodium. Steak sauce. Canned and packaged gravies. Fish sauce. Oyster sauce. Cocktail sauce. Store-bought horseradish. Ketchup. Mustard. Meat flavorings and tenderizers. Bouillon cubes. Hot sauces. Pre-made or packaged marinades. Pre-made or packaged taco seasonings. Relishes. Regular salad dressings. Other foods Salted popcorn and pretzels. The items listed above may not be a complete list of foods and beverages you should avoid. Contact a dietitian for more information. Where to find more information  National Heart, Lung, and Blood Institute: https://wilson-eaton.com/  American Heart Association: www.heart.org  Academy of Nutrition and Dietetics: www.eatright.[place]: www.kidney.org Summary  The DASH eating plan is a healthy eating plan that has been shown to reduce high blood pressure (hypertension). It may also reduce your risk for type 2 diabetes, heart disease, and stroke.  When on the DASH eating plan, aim to eat more fresh fruits and vegetables, whole grains, lean proteins, low-fat dairy, and heart-healthy fats.  With the DASH  eating plan, you should limit salt (sodium) intake to 2,300 mg a day. If you have hypertension, you may need to reduce your sodium intake to 1,500 mg a day.  Work with your health care provider or dietitian to adjust your eating plan to your individual calorie needs. This information is not intended to replace advice given to you by your health care provider. Make sure you discuss any questions you have with your health care provider. Document Revised: 04/16/2019 Document Reviewed: 04/16/2019 Elsevier Patient Education  2021 [employer].

## 2020-10-16 NOTE — Assessment & Plan Note (Signed)
Supplement and monitor 

## 2021-01-30 ENCOUNTER — Telehealth (INDEPENDENT_AMBULATORY_CARE_PROVIDER_SITE_OTHER): Payer: BC Managed Care – PPO | Admitting: Family Medicine

## 2021-01-30 DIAGNOSIS — R0600 Dyspnea, unspecified: Secondary | ICD-10-CM | POA: Insufficient documentation

## 2021-01-30 DIAGNOSIS — E039 Hypothyroidism, unspecified: Secondary | ICD-10-CM

## 2021-01-30 DIAGNOSIS — E538 Deficiency of other specified B group vitamins: Secondary | ICD-10-CM

## 2021-01-30 DIAGNOSIS — E785 Hyperlipidemia, unspecified: Secondary | ICD-10-CM | POA: Diagnosis not present

## 2021-01-30 DIAGNOSIS — E6609 Other obesity due to excess calories: Secondary | ICD-10-CM

## 2021-01-30 DIAGNOSIS — E559 Vitamin D deficiency, unspecified: Secondary | ICD-10-CM

## 2021-01-30 DIAGNOSIS — R739 Hyperglycemia, unspecified: Secondary | ICD-10-CM

## 2021-01-30 DIAGNOSIS — F418 Other specified anxiety disorders: Secondary | ICD-10-CM

## 2021-01-30 NOTE — Assessment & Plan Note (Signed)
Supplement and monitor 

## 2021-01-30 NOTE — Assessment & Plan Note (Signed)
Happens infrequently but can happen with minimal exertion, with risk factors will proceed with echo and cardiac consult

## 2021-01-30 NOTE — Progress Notes (Signed)
MyChart Video Visit    Virtual Visit via Video Note   This visit type was conducted due to national recommendations for restrictions regarding the COVID-19 Pandemic (e.g. social distancing) in an effort to limit this patient's exposure and mitigate transmission in our community. This patient is at least at moderate risk for complications without adequate follow up. This format is felt to be most appropriate for this patient at this time. Physical exam was limited by quality of the video and audio technology used for the visit. S. Chism, CMA was able to get the patient set up on a video visit.  Patient location: Home Patient and provider in visit Provider location: Office  I discussed the limitations of evaluation and management by telemedicine and the availability of in person appointments. The patient expressed understanding and agreed to proceed.  Visit Date: 01/31/21  Today's healthcare provider: Danise Edge, MD     Subjective:    Patient ID: Tiffany Morris, female    DOB: 1965/07/30, 55 y.o.   MRN: [id number]  No chief complaint on file.   HPI Patient is in today for a video visit for follow up on medication management and hyperglycemia. She states that she is doing well and has no recent . She reports that she is doing better now that she was px omeprazole. She notes having trouble at work due to being short-staffed. Denies CP/palp/HA/congestion/fevers/GI or GU c/o. Taking meds as prescribed.  She has been having SOB, but does not know if it is due to her current weight. She notes experiencing it when exerting effort like getting ready in the morning. She states that it has been happening for months now and reports that an episode lasts for 5 minutes before going away by resting and sitting down. She denies CP, nausea, diaphoresis, or palpitations.   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   Qualifier: Diagnosis of  By: Cathren Harsh MD, Jeannett Senior  H/o of hyper secondary to thyrotoxicosis    Hyperglycemia 06/05/2014   Hyperlipidemia 09/05/2011   hypothyroidism 06/02/2010   Obesity    Pain of left breast 08/05/2011   Pedal edema 08/05/2011   Thyroid disease    hyper   Unspecified sleep apnea 08/20/2013   Vitamin B 12 deficiency     Past Surgical History:  Procedure Laterality Date   CESAREAN SECTION  2004   CHOLECYSTECTOMY     laparoscopic   COLONOSCOPY  2014   RETINAL DETACHMENT REPAIR W/ SCLERAL BUCKLE LE  01-25-10    Family History  Problem Relation Age of Onset   Hyperlipidemia Mother    Diabetes Mother        borderline   Deep vein thrombosis Mother    Hyperlipidemia Father    Hypertension Father    Diabetes Father        type 2   Hyperthyroidism Father    Obesity Father    Cancer Maternal Grandmother        lung- smoker   COPD Maternal Grandfather        smoked   Cancer Maternal Grandfather        bladder   Heart disease Paternal Grandmother        CHF, MI   Hypothyroidism Paternal Grandmother    Hypertension Paternal Grandmother    Hypertension Paternal Grandfather    Heart disease Paternal Grandfather  open heart surgery   Hyperlipidemia Paternal Grandfather    Macular degeneration Paternal Grandfather    Autism spectrum disorder Son     Social History   Socioeconomic History   Marital status: Married    Spouse name: Not on file   Number of children: Not on file   Years of education: Not on file   Highest education level: Not on file  Occupational History   Occupation: NP    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    Social Determinants of Health   Financial Resource Strain: Not on file  Food Insecurity: Not on file  Transportation Needs: Not on file  Physical Activity: Not on file  Stress: Not on file  Social Connections: Not on file  Intimate Partner Violence: Not on file    Outpatient Medications Prior to Visit  Medication Sig Dispense Refill   buPROPion (WELLBUTRIN XL) 300 MG 24 hr tablet Take 1 tablet by mouth once daily 90 tablet 0   levonorgestrel (MIRENA) 20 MCG/24HR IUD 1 each by Intrauterine route once.     levothyroxine (SYNTHROID) 175 MCG tablet Take 1 tablet (175 mcg total) by mouth daily. 90 tablet 1   Omega-3 Fatty Acids (OMEGA 3 PO) Take by mouth daily.     sertraline (ZOLOFT) 50 MG tablet Take 1 tablet by mouth once daily 90 tablet 0   No facility-administered medications prior to visit.    No Known Allergies  Review of Systems  Constitutional:  Negative for chills, diaphoresis, fever and malaise/fatigue.  HENT:  Negative for congestion, sinus pain and sore throat.   Eyes:  Negative for blurred vision.  Respiratory:  Positive for shortness of breath. Negative for cough.   Cardiovascular:  Negative for chest pain, palpitations and leg swelling.  Gastrointestinal:  Negative for blood in stool, diarrhea, nausea and vomiting.  Genitourinary:  Negative for flank pain and frequency.  Musculoskeletal:  Negative for back pain.  Skin:  Negative for rash.  Neurological:  Negative for headaches.      Objective:    Physical Exam Constitutional:      Appearance: Normal appearance.  HENT:     Head: Normocephalic and atraumatic.     Right Ear: External ear normal.     Left Ear: External ear normal.  Pulmonary:     Effort: Pulmonary effort is normal.  Musculoskeletal:        General: Normal range of motion.     Cervical back: Normal  range of motion.  Skin:    General: Skin is dry.  Neurological:     Mental Status: She is alert and oriented to person, place, and time.  Psychiatric:        Behavior: Behavior normal.    There were no vitals taken for this visit. Wt Readings from Last 3 Encounters:  10/16/20 267 lb 9.6 oz (121.4 kg)  04/10/20 262 lb 9.6 oz (119.1 kg)  09/27/19 253 lb (114.8 kg)    Diabetic Foot  Exam - Simple   No data filed    Lab Results  Component Value Date   WBC 7.8 10/16/2020   HGB 14.8 10/16/2020   HCT 44.2 10/16/2020   PLT 272.0 10/16/2020   GLUCOSE 92 10/16/2020   CHOL 256 (H) 10/16/2020   TRIG 170.0 (H) 10/16/2020   HDL 46.00 10/16/2020   LDLDIRECT 168.3 06/02/2014   LDLCALC 176 (H) 10/16/2020   ALT 17 10/16/2020   AST 15 10/16/2020   NA 140 10/16/2020   K 4.4 10/16/2020   CL 104 10/16/2020   CREATININE 1.01 10/16/2020   BUN 13 10/16/2020   CO2 28 10/16/2020   TSH 17.46 (H) 10/16/2020   HGBA1C 5.5 05/11/2018    Lab Results  Component Value Date   TSH 17.46 (H) 10/16/2020   Lab Results  Component Value Date   WBC 7.8 10/16/2020   HGB 14.8 10/16/2020   HCT 44.2 10/16/2020   MCV 88.2 10/16/2020   PLT 272.0 10/16/2020   Lab Results  Component Value Date   NA 140 10/16/2020   K 4.4 10/16/2020   CO2 28 10/16/2020   GLUCOSE 92 10/16/2020   BUN 13 10/16/2020   CREATININE 1.01 10/16/2020   BILITOT 0.5 10/16/2020   ALKPHOS 57 10/16/2020   AST 15 10/16/2020   ALT 17 10/16/2020   PROT 7.5 10/16/2020   ALBUMIN 4.1 10/16/2020   CALCIUM 9.6 10/16/2020   GFR 63.90 10/16/2020   Lab Results  Component Value Date   CHOL 256 (H) 10/16/2020   Lab Results  Component Value Date   HDL 46.00 10/16/2020   Lab Results  Component Value Date   LDLCALC 176 (H) 10/16/2020   Lab Results  Component Value Date   TRIG 170.0 (H) 10/16/2020   Lab Results  Component Value Date   CHOLHDL 6 10/16/2020   Lab Results  Component Value Date   HGBA1C 5.5 05/11/2018        Assessment & Plan:   Problem List Items Addressed This Visit     Hypothyroid - Primary    Repeat lab work today      Relevant Orders   TSH   T4, free   TSH   T4, free   Ambulatory referral to Cardiology   Vitamin B 12 deficiency    Supplement and monitor      Relevant Orders   CBC   Vitamin B12   CBC   Vitamin B12   Obesity    Encouraged DASH or MIND diet, decrease po intake and increase exercise as tolerated. Needs 7-8 hours of sleep nightly. Avoid trans fats, eat small, frequent meals every 4-5 hours with lean proteins, complex carbs and healthy fats. Minimize simple carbs, high fat foods and processed foods      Depression with anxiety    Doing well no changes      Hyperlipidemia    Encourage heart healthy diet such as MIND or DASH diet, increase exercise, avoid trans fats, simple carbohydrates and processed foods, consider a krill or fish or flaxseed oil cap daily.       Relevant Orders   Lipid panel   Lipid panel   Ambulatory referral to Cardiology   Hyperglycemia    hgba1c acceptable, minimize simple carbs. Increase exercise as tolerated.       Relevant Orders   Comprehensive metabolic panel   Hemoglobin A1c   Comprehensive metabolic panel   Hemoglobin A1c   Ambulatory referral to Cardiology   Vitamin D deficiency  Supplement and monitor      Relevant Orders   VITAMIN D 25 Hydroxy (Vit-D Deficiency, Fractures)   VITAMIN D 25 Hydroxy (Vit-D Deficiency, Fractures)   Dyspnea    Happens infrequently but can happen with minimal exertion, with risk factors will proceed with echo and cardiac consult      Relevant Orders   ECHOCARDIOGRAM COMPLETE   Ambulatory referral to Cardiology      No orders of the defined types were placed in this encounter.   I discussed the assessment and treatment plan with the patient. The patient was provided an opportunity to ask questions and all were answered. The patient agreed with the plan and demonstrated an  understanding of the instructions.   The patient was advised to call back or seek an in-person evaluation if the symptoms worsen or if the condition fails to improve as anticipated.  I provided 20 minutes of face-to-face time during this encounter.   Danise Edge, MD [facility] [phone] (phone) [phone] (fax)  [facility]   I, Billie Lade, acting as a scribe for Danise Edge, MD, have documented all relevent documentation on behalf of Danise Edge, MD, as directed by Danise Edge, MD while in the presence of Danise Edge, MD. DO:01/31/21.  I, Bradd Canary, MD personally performed the services described in this documentation. All medical record entries made by the scribe were at my direction and in my presence. I have reviewed the chart and agree that the record reflects my personal performance and is accurate and complete

## 2021-01-30 NOTE — Assessment & Plan Note (Signed)
hgba1c acceptable, minimize simple carbs. Increase exercise as tolerated.  

## 2021-01-30 NOTE — Assessment & Plan Note (Signed)
Encourage heart healthy diet such as MIND or DASH diet, increase exercise, avoid trans fats, simple carbohydrates and processed foods, consider a krill or fish or flaxseed oil cap daily.  °

## 2021-01-30 NOTE — Patient Instructions (Signed)
Tiffany Morris is weekly, Tiffany Morris is daily   DASH Eating Plan DASH stands for Dietary Approaches to Stop Hypertension. The DASH eating plan is a healthy eating plan that has been shown to: Reduce high blood pressure (hypertension). Reduce your risk for type 2 diabetes, heart disease, and stroke. Help with weight loss. What are tips for following this plan? Reading food labels Check food labels for the amount of salt (sodium) per serving. Choose foods with less than 5 percent of the Daily Value of sodium. Generally, foods with less than 300 milligrams (mg) of sodium per serving fit into this eating plan. To find whole grains, look for the word "whole" as the first word in the ingredient list. Shopping Buy products labeled as "low-sodium" or "no salt added." Buy fresh foods. Avoid canned foods and pre-made or frozen meals. Cooking Avoid adding salt when cooking. Use salt-free seasonings or herbs instead of table salt or sea salt. Check with your health care provider or pharmacist before using salt substitutes. Do not fry foods. Cook foods using healthy methods such as baking, boiling, grilling, roasting, and broiling instead. Cook with heart-healthy oils, such as olive, canola, avocado, soybean, or sunflower oil. Meal planning  Eat a balanced diet that includes: 4 or more servings of fruits and 4 or more servings of vegetables each day. Try to fill one-half of your plate with fruits and vegetables. 6-8 servings of whole grains each day. Less than 6 oz (170 g) of lean meat, poultry, or fish each day. A 3-oz (85-g) serving of meat is about the same size as a deck of cards. One egg equals 1 oz (28 g). 2-3 servings of low-fat dairy each day. One serving is 1 cup (237 mL). 1 serving of nuts, seeds, or beans 5 times each week. 2-3 servings of heart-healthy fats. Healthy fats called omega-3 fatty acids are found in foods such as walnuts, flaxseeds, fortified milks, and eggs. These fats are also found in  cold-water fish, such as sardines, salmon, and mackerel. Limit how much you eat of: Canned or prepackaged foods. Food that is high in trans fat, such as some fried foods. Food that is high in saturated fat, such as fatty meat. Desserts and other sweets, sugary drinks, and other foods with added sugar. Full-fat dairy products. Do not salt foods before eating. Do not eat more than 4 egg yolks a week. Try to eat at least 2 vegetarian meals a week. Eat more home-cooked food and less restaurant, buffet, and fast food. Lifestyle When eating at a restaurant, ask that your food be prepared with less salt or no salt, if possible. If you drink alcohol: Limit how much you use to: 0-1 drink a day for women who are not pregnant. 0-2 drinks a day for men. Be aware of how much alcohol is in your drink. In the [place] one drink equals one 12 oz bottle of beer (355 mL), one 5 oz glass of wine (148 mL), or one 1 oz glass of hard liquor (44 mL). General information Avoid eating more than 2,300 mg of salt a day. If you have hypertension, you may need to reduce your sodium intake to 1,500 mg a day. Work with your health care provider to maintain a healthy body weight or to lose weight. Ask what an ideal weight is for you. Get at least 30 minutes of exercise that causes your heart to beat faster (aerobic exercise) most days of the week. Activities may include walking, swimming, or biking. Work  with your health care provider or dietitian to adjust your eating plan to your individual calorie needs. What foods should I eat? Fruits All fresh, dried, or frozen fruit. Canned fruit in natural juice (without added sugar). Vegetables Fresh or frozen vegetables (raw, steamed, roasted, or grilled). Low-sodium or reduced-sodium tomato and vegetable juice. Low-sodium or reduced-sodium tomato sauce and tomato paste. Low-sodium or reduced-sodium canned vegetables. Grains Whole-grain or whole-wheat bread. Whole-grain or  whole-wheat pasta. Brown rice. Tiffany Morris. Bulgur. Whole-grain and low-sodium cereals. Pita bread. Low-fat, low-sodium crackers. Whole-wheat flour tortillas. Meats and other proteins Skinless chicken or [place]. Ground chicken or [place]. Pork with fat trimmed off. Fish and seafood. Egg whites. Dried beans, peas, or lentils. Unsalted nuts, nut butters, and seeds. Unsalted canned beans. Lean cuts of beef with fat trimmed off. Low-sodium, lean precooked or cured meat, such as sausages or meat loaves. Dairy Low-fat (1%) or fat-free (skim) milk. Reduced-fat, low-fat, or fat-free cheeses. Nonfat, low-sodium ricotta or cottage cheese. Low-fat or nonfat yogurt. Low-fat, low-sodium cheese. Fats and oils Soft margarine without trans fats. Vegetable oil. Reduced-fat, low-fat, or light mayonnaise and salad dressings (reduced-sodium). Canola, safflower, olive, avocado, soybean, and sunflower oils. Avocado. Seasonings and condiments Herbs. Spices. Seasoning mixes without salt. Other foods Unsalted popcorn and pretzels. Fat-free sweets. The items listed above may not be a complete list of foods and beverages you can eat. Contact a dietitian for more information. What foods should I avoid? Fruits Canned fruit in a light or heavy syrup. Fried fruit. Fruit in cream or butter sauce. Vegetables Creamed or fried vegetables. Vegetables in a cheese sauce. Regular canned vegetables (not low-sodium or reduced-sodium). Regular canned tomato sauce and paste (not low-sodium or reduced-sodium). Regular tomato and vegetable juice (not low-sodium or reduced-sodium). Tiffany Morris. Olives. Grains Baked goods made with fat, such as croissants, muffins, or some breads. Dry pasta or rice meal packs. Meats and other proteins Fatty cuts of meat. Ribs. Fried meat. Tiffany Morris. Bologna, salami, and other precooked or cured meats, such as sausages or meat loaves. Fat from the back of a pig (fatback). Bratwurst. Salted nuts and seeds. Canned  beans with added salt. Canned or smoked fish. Whole eggs or egg yolks. Chicken or [place] with skin. Dairy Whole or 2% milk, cream, and half-and-half. Whole or full-fat cream cheese. Whole-fat or sweetened yogurt. Full-fat cheese. Nondairy creamers. Whipped toppings. Processed cheese and cheese spreads. Fats and oils Butter. Stick margarine. Lard. Shortening. Ghee. Bacon fat. Tropical oils, such as coconut, palm kernel, or palm oil. Seasonings and condiments Onion salt, garlic salt, seasoned salt, table salt, and sea salt. Worcestershire sauce. Tartar sauce. Barbecue sauce. Teriyaki sauce. Soy sauce, including reduced-sodium. Steak sauce. Canned and packaged gravies. Fish sauce. Oyster sauce. Cocktail sauce. Store-bought horseradish. Ketchup. Mustard. Meat flavorings and tenderizers. Bouillon cubes. Hot sauces. Pre-made or packaged marinades. Pre-made or packaged taco seasonings. Relishes. Regular salad dressings. Other foods Salted popcorn and pretzels. The items listed above may not be a complete list of foods and beverages you should avoid. Contact a dietitian for more information. Where to find more information National Heart, Lung, and Blood Institute: https://wilson-eaton.com/ American Heart Association: www.heart.org Academy of Nutrition and Dietetics: www.eatright.[place]: www.kidney.org Summary The DASH eating plan is a healthy eating plan that has been shown to reduce high blood pressure (hypertension). It may also reduce your risk for type 2 diabetes, heart disease, and stroke. When on the DASH eating plan, aim to eat more fresh fruits and vegetables, whole grains, lean proteins,  low-fat dairy, and heart-healthy fats. With the DASH eating plan, you should limit salt (sodium) intake to 2,300 mg a day. If you have hypertension, you may need to reduce your sodium intake to 1,500 mg a day. Work with your health care provider or dietitian to adjust your eating plan to your  individual calorie needs. This information is not intended to replace advice given to you by your health care provider. Make sure you discuss any questions you have with your health care provider. Document Revised: 04/16/2019 Document Reviewed: 04/16/2019 Elsevier Patient Education  2022 [employer].

## 2021-01-30 NOTE — Assessment & Plan Note (Signed)
Doing well no changes 

## 2021-01-30 NOTE — Assessment & Plan Note (Signed)
Repeat lab work today 

## 2021-01-30 NOTE — Assessment & Plan Note (Signed)
Encouraged DASH or MIND diet, decrease po intake and increase exercise as tolerated. Needs 7-8 hours of sleep nightly. Avoid trans fats, eat small, frequent meals every 4-5 hours with lean proteins, complex carbs and healthy fats. Minimize simple carbs, high fat foods and processed foods 

## 2021-02-06 ENCOUNTER — Other Ambulatory Visit: Payer: BC Managed Care – PPO

## 2021-02-15 ENCOUNTER — Telehealth: Payer: Self-pay

## 2021-02-15 NOTE — Telephone Encounter (Signed)
Error

## 2021-02-28 ENCOUNTER — Ambulatory Visit (HOSPITAL_BASED_OUTPATIENT_CLINIC_OR_DEPARTMENT_OTHER): Admission: RE | Admit: 2021-02-28 | Payer: BC Managed Care – PPO | Source: Ambulatory Visit

## 2021-03-05 ENCOUNTER — Telehealth: Payer: Self-pay | Admitting: *Deleted

## 2021-03-05 NOTE — Telephone Encounter (Signed)
No answer, vm full

## 2021-03-05 NOTE — Telephone Encounter (Signed)
Bradd Canary, MD  Blima Ledger, CMA; Chism, Shaakira A, CMA Please check with patient and confirm she no longer wants to see cardiology or maybe she is seeing someone else.

## 2021-03-19 ENCOUNTER — Telehealth: Payer: BC Managed Care – PPO | Admitting: Physician Assistant

## 2021-03-19 DIAGNOSIS — R6889 Other general symptoms and signs: Secondary | ICD-10-CM | POA: Diagnosis not present

## 2021-03-19 MED ORDER — BENZONATATE 100 MG PO CAPS: 100.0000 mg | ORAL_CAPSULE | Freq: Three times a day (TID) | ORAL | 0 refills | Status: DC | PRN

## 2021-03-19 MED ORDER — IPRATROPIUM BROMIDE 0.03 % NA SOLN: 2.0000 | Freq: Two times a day (BID) | NASAL | 0 refills | Status: DC

## 2021-03-19 NOTE — Progress Notes (Signed)
E visit for Flu like symptoms   We are sorry that you are not feeling well.  Here is how we plan to help! Based on what you have shared with me it looks like you may have a respiratory virus that may be influenza.  Influenza or "the flu" is   an infection caused by a respiratory virus. The flu virus is highly contagious and persons who did not receive their yearly flu vaccination may "catch" the flu from close contact.  We have anti-viral medications to treat the viruses that cause this infection. They are not a "cure" and only shorten the course of the infection. These prescriptions are most effective when they are given within the first 2 days of "flu" symptoms. Antiviral medication are indicated if you have a high risk of complications from the flu. You should  also consider an antiviral medication if you are in close contact with someone who is at risk. These medications can help patients avoid complications from the flu  but have side effects that you should know. Possible side effects from Tamiflu or oseltamivir include nausea, vomiting, diarrhea, dizziness, headaches, eye redness, sleep problems or other respiratory symptoms. You should not take Tamiflu if you have an allergy to oseltamivir or any to the ingredients in Tamiflu.  Based upon your symptoms and potential risk factors I recommend that you follow the flu symptoms recommendation that I have listed below.  I will send in tessalon perles for cough and ipratropium nasal spray for the nasal/sinus congestion. You can do salt water gargles and chloraseptic spray for the sore throat. Also continue and symptomatic management over the counter that you prefer (Dayquil/Nyquil, Alka Seltzer, Mucinex, etc)  ANYONE WHO HAS FLU SYMPTOMS SHOULD: Stay home. The flu is highly contagious and going out or to work exposes others! Be sure to drink plenty of fluids. Water is fine as well as fruit juices, sodas and electrolyte beverages. You may want to  stay away from caffeine or alcohol. If you are nauseated, try taking small sips of liquids. How do you know if you are getting enough fluid? Your urine should be a pale yellow or almost colorless. Get rest. Taking a steamy shower or using a humidifier may help nasal congestion and ease sore throat pain. Using a saline nasal spray works much the same way. Cough drops, hard candies and sore throat lozenges may ease your cough. Line up a caregiver. Have someone check on you regularly.   GET HELP RIGHT AWAY IF: You cannot keep down liquids or your medications. You become short of breath Your fell like you are going to pass out or loose consciousness. Your symptoms persist after you have completed your treatment plan MAKE SURE YOU  Understand these instructions. Will watch your condition. Will get help right away if you are not doing well or get worse.  Your e-visit answers were reviewed by a board certified advanced clinical practitioner to complete your personal care plan.  Depending on the condition, your plan could have included both over the counter or prescription medications.  If there is a problem please reply  once you have received a response from your provider.  Your safety is important to [place].  If you have drug allergies check your prescription carefully.    You can use MyChart to ask questions about today's visit, request a non-urgent call back, or ask for a work or school excuse for 24 hours related to this e-Visit. If it has been greater than  24 hours you will need to follow up with your provider, or enter a new e-Visit to address those concerns.  You will get an e-mail in the next two days asking about your experience.  I hope that your e-visit has been valuable and will speed your recovery. Thank you for using e-visits.   I provided 5 minutes of non face-to-face time during this encounter for chart review and documentation.

## 2021-03-20 ENCOUNTER — Emergency Department: Admit: 2021-03-20 | Discharge status: Absent without leave | Payer: BC Managed Care – PPO | Source: Home / Self Care

## 2021-03-20 ENCOUNTER — Other Ambulatory Visit: Payer: Self-pay

## 2021-03-20 ENCOUNTER — Encounter: Payer: Self-pay | Admitting: Family Medicine

## 2021-03-20 ENCOUNTER — Encounter: Payer: Self-pay | Admitting: Emergency Medicine

## 2021-03-20 ENCOUNTER — Emergency Department
Admission: EM | Admit: 2021-03-20 | Discharge: 2021-03-20 | Disposition: A | Discharge status: Home / Self Care | Payer: BC Managed Care – PPO | Source: Home / Self Care

## 2021-03-20 DIAGNOSIS — J029 Acute pharyngitis, unspecified: Secondary | ICD-10-CM | POA: Diagnosis not present

## 2021-03-20 DIAGNOSIS — J111 Influenza due to unidentified influenza virus with other respiratory manifestations: Secondary | ICD-10-CM

## 2021-03-20 LAB — POCT INFLUENZA A/B
Influenza A, POC: NEGATIVE
Influenza B, POC: NEGATIVE

## 2021-03-20 LAB — POCT RAPID STREP A (OFFICE): Rapid Strep A Screen: NEGATIVE

## 2021-03-20 NOTE — ED Notes (Signed)
Disregard covid/flu [facility] send out test - entered in error

## 2021-03-20 NOTE — Discharge Instructions (Signed)
Continue with lots of fluids Over-the-counter cough cold medicine as needed Call for problems

## 2021-03-20 NOTE — ED Triage Notes (Signed)
Cough since Friday Chills on Sat  Negative home COVID test on Fri & Sun Ibuprofen last night & this morning  COVID vaccine x 2 - no booster

## 2021-03-20 NOTE — ED Provider Notes (Signed)
[facility]    CSN: [id number] Arrival date & time: 03/20/21  1259      History   Chief Complaint Chief Complaint  Patient presents with   Cough    HPI Tiffany Morris is a 55 y.o. female.   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  She states that she has had the onset of cough, chills, body aches and weakness since Saturday.  She has had a COVID-vaccine but not her influenza vaccine.  She is done 2 COVID tests at home that were negative.  She also has a very painful sore throat.  She is worried about strep.  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    Hyperglycemia 06/05/2014   Hyperlipidemia 09/05/2011   hypothyroidism 06/02/2010   Obesity    Pain of left breast 08/05/2011   Pedal edema 08/05/2011   Thyroid disease    hyper   Unspecified sleep apnea 08/20/2013   Vitamin B 12 deficiency     Patient Active Problem List   Diagnosis Date Noted   Dyspnea 01/30/2021   Right elbow pain 04/10/2020   Vitamin D deficiency 05/11/2018   Hyperglycemia 06/05/2014   OSA (obstructive sleep apnea) 09/22/2013   Hyperlipidemia 09/05/2011   Depression with anxiety 08/05/2011   Preventative health care 08/05/2011   Pedal edema 08/05/2011   Hypothyroid    Vitamin B 12 deficiency    Obesity    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      Home Medications    Prior to Admission medications   Medication Sig Start Date End Date Taking? Authorizing Provider  benzonatate (TESSALON) 100 MG capsule Take 1 capsule (100 mg  total) by mouth 3 (three) times daily as needed. 03/19/21  Yes Margaretann Loveless, PA-C  ipratropium (ATROVENT) 0.03 % nasal spray Place 2 sprays into both nostrils every 12 (twelve) hours. 03/19/21  Yes Margaretann Loveless, PA-C  Cholecalciferol 25 MCG (1000 UT) tablet Take by mouth.    [provider]  cyanocobalamin 1000 MCG tablet Take by mouth.    [provider]  levonorgestrel (MIRENA) 20 MCG/24HR IUD 1 each by Intrauterine route once.    [provider]  levothyroxine (SYNTHROID) 175 MCG tablet Take 1 tablet (175 mcg total) by mouth daily. 10/16/20   Bradd Canary, MD  Omega-3 Fatty Acids (OMEGA 3 PO) Take by mouth daily.    [provider]  omeprazole (PRILOSEC) 40 MG capsule Take 40 mg by mouth daily. 03/18/21   [provider]  sertraline (ZOLOFT) 50 MG tablet Take 1 tablet by mouth once daily 08/15/20   Bradd Canary, MD    Family History Family History  Problem Relation Age of Onset   Hyperlipidemia Mother    Diabetes Mother        borderline   Deep vein thrombosis Mother    Hyperlipidemia Father    Hypertension Father    Diabetes Father  type 2   Hyperthyroidism Father    Obesity Father    Cancer Maternal Grandmother        lung- smoker   COPD Maternal Grandfather        smoked   Cancer Maternal Grandfather        bladder   Heart disease Paternal Grandmother        CHF, MI   Hypothyroidism Paternal Grandmother    Hypertension Paternal Grandmother    Hypertension Paternal Grandfather    Heart disease Paternal Grandfather        open heart surgery   Hyperlipidemia Paternal Grandfather    Macular degeneration Paternal Grandfather    Autism spectrum disorder Son     Social History Social History   Tobacco Use   Smoking status: Never   Smokeless tobacco: Current  Substance Use Topics   Alcohol use: No    Alcohol/week: 0.0 standard drinks   Drug use: No     Allergies   Patient has no known  allergies.   Review of Systems Review of Systems  See HPI Physical Exam Triage Vital Signs ED Triage Vitals  Enc Vitals Group     BP 03/20/21 1334 (!) 135/97     Pulse Rate 03/20/21 1334 (!) 111     Resp 03/20/21 1334 18     Temp 03/20/21 1334 98.7 F (37.1 C)     Temp Source 03/20/21 1334 Oral     SpO2 03/20/21 1334 96 %     Weight 03/20/21 1338 262 lb (118.8 kg)     Height 03/20/21 1338 5\' 4"  (1.626 m)     Head Circumference --      Peak Flow --      Pain Score 03/20/21 1337 4     Pain Loc --      Pain Edu? --      Excl. in GC? --    No data found.  Updated Vital Signs BP (!) 135/97 (BP Location: Right Arm)   Pulse (!) 111   Temp 98.7 F (37.1 C) (Oral)   Resp 18   Ht 5\' 4"  (1.626 m)   Wt 118.8 kg   SpO2 96%   BMI 44.97 kg/m      Physical Exam Constitutional:      General: She is not in acute distress.    Appearance: She is well-developed. She is obese. She is ill-appearing.  HENT:     Head: Normocephalic and atraumatic.     Right Ear: Tympanic membrane and ear canal normal.     Left Ear: Tympanic membrane and ear canal normal.     Nose: Congestion present.     Mouth/Throat:     Pharynx: Posterior oropharyngeal erythema present.  Eyes:     Conjunctiva/sclera: Conjunctivae normal.     Pupils: Pupils are equal, round, and reactive to light.  Cardiovascular:     Rate and Rhythm: Normal rate and regular rhythm.     Heart sounds: Normal heart sounds.  Pulmonary:     Effort: Pulmonary effort is normal. No respiratory distress.     Breath sounds: Normal breath sounds. No wheezing or rhonchi.  Abdominal:     General: There is no distension.     Palpations: Abdomen is soft.  Musculoskeletal:        General: Normal range of motion.     Cervical back: Normal range of motion.  Lymphadenopathy:     Cervical: No cervical adenopathy.  Skin:    General:  Skin is warm and dry.  Neurological:     Mental Status: She is alert.  Psychiatric:        Mood and  Affect: Mood normal.        Behavior: Behavior normal.     UC Treatments / Results  Labs (all labs ordered are listed, but only abnormal results are displayed) Labs Reviewed  COVID-19, FLU A+B NAA  CULTURE, GROUP A STREP  POCT RAPID STREP A (OFFICE)  POCT INFLUENZA A/B  Rapid strep test is negative Rapid influenza testing is negative Home COVID testing is negative  EKG   Radiology No results found.  Procedures Procedures (including critical care time)  Medications Ordered in UC Medications - No data to display  Initial Impression / Assessment and Plan / UC Course  I have reviewed the triage vital signs and the nursing notes.  Pertinent labs & imaging results that were available during my care of the patient were reviewed by me and considered in my medical decision making (see chart for details).     Reviewed with patient that this is a viral upper respiratory infection.  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  Return as needed Final Clinical Impressions(s) / UC Diagnoses   Final diagnoses:  Acute pharyngitis, unspecified etiology  Influenza-like illness     Discharge Instructions      Continue with lots of fluids Over-the-counter cough cold medicine as needed Call for problems     ED Prescriptions   None    PDMP not reviewed this encounter.   Eustace Moore, MD 03/20/21 [phone]

## 2021-03-21 ENCOUNTER — Other Ambulatory Visit: Payer: BC Managed Care – PPO

## 2021-03-21 ENCOUNTER — Ambulatory Visit (HOSPITAL_BASED_OUTPATIENT_CLINIC_OR_DEPARTMENT_OTHER): Admission: RE | Admit: 2021-03-21 | Payer: BC Managed Care – PPO | Source: Ambulatory Visit

## 2021-03-22 NOTE — Telephone Encounter (Signed)
Patient has an appt on 04/05/21

## 2021-03-23 LAB — CULTURE, GROUP A STREP: Strep A Culture: NEGATIVE

## 2021-04-04 DIAGNOSIS — H338 Other retinal detachments: Secondary | ICD-10-CM | POA: Diagnosis not present

## 2021-04-05 ENCOUNTER — Ambulatory Visit: Payer: BC Managed Care – PPO | Admitting: Cardiology

## 2021-04-09 ENCOUNTER — Ambulatory Visit (HOSPITAL_BASED_OUTPATIENT_CLINIC_OR_DEPARTMENT_OTHER)
Admission: RE | Admit: 2021-04-09 | Discharge: 2021-04-09 | Disposition: A | Discharge status: Home / Self Care | Payer: BC Managed Care – PPO | Source: Ambulatory Visit | Attending: Family Medicine | Admitting: Family Medicine

## 2021-04-09 ENCOUNTER — Other Ambulatory Visit: Payer: Self-pay

## 2021-04-09 DIAGNOSIS — R0600 Dyspnea, unspecified: Secondary | ICD-10-CM | POA: Diagnosis not present

## 2021-04-09 DIAGNOSIS — R0609 Other forms of dyspnea: Secondary | ICD-10-CM | POA: Diagnosis not present

## 2021-04-09 LAB — ECHOCARDIOGRAM COMPLETE
AR max vel: 3.12 cm2
AV Area VTI: 2.94 cm2
AV Area mean vel: 2.78 cm2
AV Mean grad: 2 mmHg
AV Peak grad: 3 mmHg
Ao pk vel: 0.86 m/s
Area-P 1/2: 3.72 cm2
Calc EF: 53.6 %
S' Lateral: 3.8 cm
Single Plane A2C EF: 51.7 %
Single Plane A4C EF: 56.6 %

## 2021-04-09 MED ORDER — PERFLUTREN LIPID MICROSPHERE
1.0000 mL | INTRAVENOUS | Status: AC | PRN
  Administered 2021-04-09: 4 mL via INTRAVENOUS

## 2021-04-12 ENCOUNTER — Other Ambulatory Visit (HOSPITAL_COMMUNITY): Payer: Self-pay | Admitting: Family Medicine

## 2021-04-12 DIAGNOSIS — Z1231 Encounter for screening mammogram for malignant neoplasm of breast: Secondary | ICD-10-CM

## 2021-04-13 ENCOUNTER — Other Ambulatory Visit: Payer: Self-pay | Admitting: Family Medicine

## 2021-04-13 DIAGNOSIS — G4733 Obstructive sleep apnea (adult) (pediatric): Secondary | ICD-10-CM | POA: Diagnosis not present

## 2021-04-20 ENCOUNTER — Other Ambulatory Visit: Payer: Self-pay

## 2021-04-20 ENCOUNTER — Ambulatory Visit (HOSPITAL_COMMUNITY)
Admission: RE | Admit: 2021-04-20 | Discharge: 2021-04-20 | Disposition: A | Discharge status: Home / Self Care | Payer: BC Managed Care – PPO | Source: Ambulatory Visit | Attending: Family Medicine | Admitting: Family Medicine

## 2021-04-20 DIAGNOSIS — Z1231 Encounter for screening mammogram for malignant neoplasm of breast: Secondary | ICD-10-CM | POA: Insufficient documentation

## 2021-04-24 ENCOUNTER — Ambulatory Visit: Payer: BC Managed Care – PPO | Admitting: Family Medicine

## 2021-04-24 ENCOUNTER — Other Ambulatory Visit: Payer: Self-pay

## 2021-04-24 ENCOUNTER — Encounter: Payer: Self-pay | Admitting: Family Medicine

## 2021-04-24 VITALS — BP 126/74 | HR 99 | Temp 98.2°F | Resp 16 | Wt 267.6 lb

## 2021-04-24 DIAGNOSIS — B372 Candidiasis of skin and nail: Secondary | ICD-10-CM | POA: Diagnosis not present

## 2021-04-24 DIAGNOSIS — Z23 Encounter for immunization: Secondary | ICD-10-CM | POA: Diagnosis not present

## 2021-04-24 DIAGNOSIS — E6609 Other obesity due to excess calories: Secondary | ICD-10-CM | POA: Diagnosis not present

## 2021-04-24 DIAGNOSIS — E538 Deficiency of other specified B group vitamins: Secondary | ICD-10-CM | POA: Diagnosis not present

## 2021-04-24 DIAGNOSIS — E039 Hypothyroidism, unspecified: Secondary | ICD-10-CM

## 2021-04-24 DIAGNOSIS — E559 Vitamin D deficiency, unspecified: Secondary | ICD-10-CM | POA: Diagnosis not present

## 2021-04-24 DIAGNOSIS — R739 Hyperglycemia, unspecified: Secondary | ICD-10-CM | POA: Diagnosis not present

## 2021-04-24 DIAGNOSIS — E785 Hyperlipidemia, unspecified: Secondary | ICD-10-CM

## 2021-04-24 LAB — LIPID PANEL
Cholesterol: 275 mg/dL — ABNORMAL HIGH (ref 0–200)
HDL: 45.2 mg/dL (ref >39.00)
LDL Cholesterol: 197 mg/dL — ABNORMAL HIGH (ref 0–99)
NonHDL: 230.03
Total CHOL/HDL Ratio: 6
Triglycerides: 167 mg/dL — ABNORMAL HIGH (ref 0.0–149.0)
VLDL: 33.4 mg/dL (ref 0.0–40.0)

## 2021-04-24 LAB — COMPREHENSIVE METABOLIC PANEL
ALT: 16 U/L (ref 0–35)
AST: 17 U/L (ref 0–37)
Albumin: 4 g/dL (ref 3.5–5.2)
Alkaline Phosphatase: 61 U/L (ref 39–117)
BUN: 10 mg/dL (ref 6–23)
CO2: 26 mEq/L (ref 19–32)
Calcium: 9.1 mg/dL (ref 8.4–10.5)
Chloride: 104 mEq/L (ref 96–112)
Creatinine, Ser: 1.01 mg/dL (ref 0.40–1.20)
GFR: 63.66 mL/min (ref >60.00)
Glucose, Bld: 148 mg/dL — ABNORMAL HIGH (ref 70–99)
Potassium: 4 mEq/L (ref 3.5–5.1)
Sodium: 138 mEq/L (ref 135–145)
Total Bilirubin: 0.5 mg/dL (ref 0.2–1.2)
Total Protein: 7 g/dL (ref 6.0–8.3)

## 2021-04-24 LAB — VITAMIN D 25 HYDROXY (VIT D DEFICIENCY, FRACTURES): VITD: 27.44 ng/mL — ABNORMAL LOW (ref 30.00–100.00)

## 2021-04-24 LAB — T4, FREE: Free T4: 0.61 ng/dL (ref 0.60–1.60)

## 2021-04-24 LAB — CBC
HCT: 42.8 % (ref 36.0–46.0)
Hemoglobin: 14.5 g/dL (ref 12.0–15.0)
MCHC: 33.9 g/dL (ref 30.0–36.0)
MCV: 89 fl (ref 78.0–100.0)
Platelets: 273 10*3/uL (ref 150.0–400.0)
RBC: 4.81 Mil/uL (ref 3.87–5.11)
RDW: 13.9 % (ref 11.5–15.5)
WBC: 6.3 10*3/uL (ref 4.0–10.5)

## 2021-04-24 LAB — HEMOGLOBIN A1C: Hgb A1c MFr Bld: 5.9 % (ref 4.6–6.5)

## 2021-04-24 LAB — VITAMIN B12: Vitamin B-12: 279 pg/mL (ref 211–911)

## 2021-04-24 LAB — TSH: TSH: 26.52 u[IU]/mL — ABNORMAL HIGH (ref 0.35–5.50)

## 2021-04-24 MED ORDER — NYSTATIN 100000 UNIT/GM EX CREA: 1.0000 "application " | TOPICAL_CREAM | Freq: Two times a day (BID) | CUTANEOUS | 2 refills | Status: AC

## 2021-04-24 NOTE — Patient Instructions (Addendum)
Reginal Lutes (weekly) or Saxenda (daily) are for weight loss but have to check with insurance and let us know if you want to start it.   Kegel exercises twice daily DASH Eating Plan DASH stands for Dietary Approaches to Stop Hypertension. The DASH eating plan is a healthy eating plan that has been shown to: Reduce high blood pressure (hypertension). Reduce your risk for type 2 diabetes, heart disease, and stroke. Help with weight loss. What are tips for following this plan? Reading food labels Check food labels for the amount of salt (sodium) per serving. Choose foods with less than 5 percent of the Daily Value of sodium. Generally, foods with less than 300 milligrams (mg) of sodium per serving fit into this eating plan. To find whole grains, look for the word "whole" as the first word in the ingredient list. Shopping Buy products labeled as "low-sodium" or "no salt added." Buy fresh foods. Avoid canned foods and pre-made or frozen meals. Cooking Avoid adding salt when cooking. Use salt-free seasonings or herbs instead of table salt or sea salt. Check with your health care provider or pharmacist before using salt substitutes. Do not fry foods. Cook foods using healthy methods such as baking, boiling, grilling, roasting, and broiling instead. Cook with heart-healthy oils, such as olive, canola, avocado, soybean, or sunflower oil. Meal planning  Eat a balanced diet that includes: 4 or more servings of fruits and 4 or more servings of vegetables each day. Try to fill one-half of your plate with fruits and vegetables. 6-8 servings of whole grains each day. Less than 6 oz (170 g) of lean meat, poultry, or fish each day. A 3-oz (85-g) serving of meat is about the same size as a deck of cards. One egg equals 1 oz (28 g). 2-3 servings of low-fat dairy each day. One serving is 1 cup (237 mL). 1 serving of nuts, seeds, or beans 5 times each week. 2-3 servings of heart-healthy fats. Healthy fats called  omega-3 fatty acids are found in foods such as walnuts, flaxseeds, fortified milks, and eggs. These fats are also found in cold-water fish, such as sardines, salmon, and mackerel. Limit how much you eat of: Canned or prepackaged foods. Food that is high in trans fat, such as some fried foods. Food that is high in saturated fat, such as fatty meat. Desserts and other sweets, sugary drinks, and other foods with added sugar. Full-fat dairy products. Do not salt foods before eating. Do not eat more than 4 egg yolks a week. Try to eat at least 2 vegetarian meals a week. Eat more home-cooked food and less restaurant, buffet, and fast food. Lifestyle When eating at a restaurant, ask that your food be prepared with less salt or no salt, if possible. If you drink alcohol: Limit how much you use to: 0-1 drink a day for women who are not pregnant. 0-2 drinks a day for men. Be aware of how much alcohol is in your drink. In the [place] one drink equals one 12 oz bottle of beer (355 mL), one 5 oz glass of wine (148 mL), or one 1 oz glass of hard liquor (44 mL). General information Avoid eating more than 2,300 mg of salt a day. If you have hypertension, you may need to reduce your sodium intake to 1,500 mg a day. Work with your health care provider to maintain a healthy body weight or to lose weight. Ask what an ideal weight is for you. Get at least 30 minutes of  exercise that causes your heart to beat faster (aerobic exercise) most days of the week. Activities may include walking, swimming, or biking. Work with your health care provider or dietitian to adjust your eating plan to your individual calorie needs. What foods should I eat? Fruits All fresh, dried, or frozen fruit. Canned fruit in natural juice (without added sugar). Vegetables Fresh or frozen vegetables (raw, steamed, roasted, or grilled). Low-sodium or reduced-sodium tomato and vegetable juice. Low-sodium or reduced-sodium tomato sauce and  tomato paste. Low-sodium or reduced-sodium canned vegetables. Grains Whole-grain or whole-wheat bread. Whole-grain or whole-wheat pasta. Brown rice. [name]. Bulgur. Whole-grain and low-sodium cereals. Pita bread. Low-fat, low-sodium crackers. Whole-wheat flour tortillas. Meats and other proteins Skinless chicken or [place]. Ground chicken or [place]. Pork with fat trimmed off. Fish and seafood. Egg whites. Dried beans, peas, or lentils. Unsalted nuts, nut butters, and seeds. Unsalted canned beans. Lean cuts of beef with fat trimmed off. Low-sodium, lean precooked or cured meat, such as sausages or meat loaves. Dairy Low-fat (1%) or fat-free (skim) milk. Reduced-fat, low-fat, or fat-free cheeses. Nonfat, low-sodium ricotta or cottage cheese. Low-fat or nonfat yogurt. Low-fat, low-sodium cheese. Fats and oils Soft margarine without trans fats. Vegetable oil. Reduced-fat, low-fat, or light mayonnaise and salad dressings (reduced-sodium). Canola, safflower, olive, avocado, soybean, and sunflower oils. Avocado. Seasonings and condiments Herbs. Spices. Seasoning mixes without salt. Other foods Unsalted popcorn and pretzels. Fat-free sweets. The items listed above may not be a complete list of foods and beverages you can eat. Contact a dietitian for more information. What foods should I avoid? Fruits Canned fruit in a light or heavy syrup. Fried fruit. Fruit in cream or butter sauce. Vegetables Creamed or fried vegetables. Vegetables in a cheese sauce. Regular canned vegetables (not low-sodium or reduced-sodium). Regular canned tomato sauce and paste (not low-sodium or reduced-sodium). Regular tomato and vegetable juice (not low-sodium or reduced-sodium). [name]. Olives. Grains Baked goods made with fat, such as croissants, muffins, or some breads. Dry pasta or rice meal packs. Meats and other proteins Fatty cuts of meat. Ribs. Fried meat. [name]. Bologna, salami, and other precooked or cured  meats, such as sausages or meat loaves. Fat from the back of a pig (fatback). Bratwurst. Salted nuts and seeds. Canned beans with added salt. Canned or smoked fish. Whole eggs or egg yolks. Chicken or [place] with skin. Dairy Whole or 2% milk, cream, and half-and-half. Whole or full-fat cream cheese. Whole-fat or sweetened yogurt. Full-fat cheese. Nondairy creamers. Whipped toppings. Processed cheese and cheese spreads. Fats and oils Butter. Stick margarine. Lard. Shortening. Ghee. Bacon fat. Tropical oils, such as coconut, palm kernel, or palm oil. Seasonings and condiments Onion salt, garlic salt, seasoned salt, table salt, and sea salt. Worcestershire sauce. Tartar sauce. Barbecue sauce. Teriyaki sauce. Soy sauce, including reduced-sodium. Steak sauce. Canned and packaged gravies. Fish sauce. Oyster sauce. Cocktail sauce. Store-bought horseradish. Ketchup. Mustard. Meat flavorings and tenderizers. Bouillon cubes. Hot sauces. Pre-made or packaged marinades. Pre-made or packaged taco seasonings. Relishes. Regular salad dressings. Other foods Salted popcorn and pretzels. The items listed above may not be a complete list of foods and beverages you should avoid. Contact a dietitian for more information. Where to find more information National Heart, Lung, and Blood Institute: https://wilson-eaton.com/ American Heart Association: www.heart.org Academy of Nutrition and Dietetics: www.eatright.[place]: www.kidney.org Summary The DASH eating plan is a healthy eating plan that has been shown to reduce high blood pressure (hypertension). It may also reduce your risk for type 2  diabetes, heart disease, and stroke. When on the DASH eating plan, aim to eat more fresh fruits and vegetables, whole grains, lean proteins, low-fat dairy, and heart-healthy fats. With the DASH eating plan, you should limit salt (sodium) intake to 2,300 mg a day. If you have hypertension, you may need to reduce your  sodium intake to 1,500 mg a day. Work with your health care provider or dietitian to adjust your eating plan to your individual calorie needs. This information is not intended to replace advice given to you by your health care provider. Make sure you discuss any questions you have with your health care provider. Document Revised: 04/16/2019 Document Reviewed: 04/16/2019 Elsevier Patient Education  2022 [employer].

## 2021-04-24 NOTE — Assessment & Plan Note (Signed)
Supplement and monitor 

## 2021-04-24 NOTE — Assessment & Plan Note (Signed)
Encourage heart healthy diet such as MIND or DASH diet, increase exercise, avoid trans fats, simple carbohydrates and processed foods, consider a krill or fish or flaxseed oil cap daily.  °

## 2021-04-24 NOTE — Assessment & Plan Note (Addendum)
Encouraged DASH or MIND diet, decrease po intake and increase exercise as tolerated. Needs 7-8 hours of sleep nightly. Avoid trans fats, eat small, frequent meals every 4-5 hours with lean proteins, complex carbs and healthy fats. Minimize simple carbs, high fat foods and processed foods. Consider Wegovy or Saxenda °

## 2021-04-24 NOTE — Assessment & Plan Note (Addendum)
On Levothyroxine, continue to monitor, TSH was elevated at last check

## 2021-04-24 NOTE — Assessment & Plan Note (Signed)
Given a prescription of Nystatin to use bid prn and she will let us know if persists.

## 2021-04-24 NOTE — Assessment & Plan Note (Signed)
hgba1c acceptable, minimize simple carbs. Increase exercise as tolerated.  

## 2021-04-24 NOTE — Progress Notes (Signed)
Subjective:    Patient ID: Tiffany Morris, female    DOB: [birth year], 55 y.o.   MRN: [id number]  No chief complaint on file.   HPI Patient is in today for follow up on chronic medical concerns. No recent febrile illness or hospitalizations. She feels well but has been noting some rash under arms and in groin has not responded to over the counter treatment. It is pruritic at times. Denies CP/palp/HA/congestion/fevers/GI or GU c/o. Taking meds as prescribed. She continues to note SOB with exertion but it is not worsening and recovers with rest. Her Echo was reassuring.   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    Hyperglycemia 06/05/2014   Hyperlipidemia 09/05/2011   hypothyroidism 06/02/2010   Obesity    Pain of left breast 08/05/2011   Pedal edema 08/05/2011   Thyroid disease    hyper   Unspecified sleep apnea 08/20/2013   Vitamin B 12 deficiency     Past Surgical History:  Procedure Laterality Date   CESAREAN SECTION  2004   CHOLECYSTECTOMY     laparoscopic   COLONOSCOPY  2014   RETINAL DETACHMENT REPAIR W/ SCLERAL BUCKLE LE  01-25-10    Family History  Problem Relation Age of Onset   Hyperlipidemia Mother    Diabetes Mother        borderline   Deep vein thrombosis Mother    Hyperlipidemia Father    Hypertension Father    Diabetes Father        type 2   Hyperthyroidism Father    Obesity Father    Cancer Maternal Grandmother        lung- smoker   COPD Maternal Grandfather        smoked   Cancer Maternal Grandfather        bladder   Heart disease Paternal Grandmother        CHF, MI   Hypothyroidism Paternal Grandmother    Hypertension Paternal Grandmother    Hypertension Paternal Grandfather    Heart disease Paternal Grandfather        open heart surgery   Hyperlipidemia Paternal  Grandfather    Macular degeneration Paternal Grandfather    Autism spectrum disorder Son     Social History   Socioeconomic History   Marital status: Married    Spouse name: Not on file   Number of children: Not on file   Years of education: Not on file   Highest education level: Not on file  Occupational History   Occupation: NP    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    Social Determinants of Health   Financial Resource Strain: Not on file  Food Insecurity: Not on file  Transportation Needs: Not on file  Physical Activity: Not on file  Stress:  Not on file  Social Connections: Not on file  Intimate Partner Violence: Not on file    Outpatient Medications Prior to Visit  Medication Sig Dispense Refill   Cholecalciferol 25 MCG (1000 UT) tablet Take 3,000 Units by mouth.     cyanocobalamin 1000 MCG tablet Take by mouth.     levonorgestrel (MIRENA) 20 MCG/24HR IUD 1 each by Intrauterine route once.     levothyroxine (SYNTHROID) 175 MCG tablet Take 1 tablet (175 mcg total) by mouth daily. 90 tablet 1   Omega-3 Fatty Acids (OMEGA 3 PO) Take by mouth daily.     omeprazole (PRILOSEC) 40 MG capsule Take 40 mg by mouth daily.     sertraline (ZOLOFT) 50 MG tablet Take 1 tablet by mouth once daily 30 tablet 0   benzonatate (TESSALON) 100 MG capsule Take 1 capsule (100 mg total) by mouth 3 (three) times daily as needed. 30 capsule 0   ipratropium (ATROVENT) 0.03 % nasal spray Place 2 sprays into both nostrils every 12 (twelve) hours. 30 mL 0    No facility-administered medications prior to visit.    Allergies  Allergen Reactions   Codeine Nausea And Vomiting   Bee Venom Other (See Comments)    Review of Systems  Constitutional:  Negative for fever and malaise/fatigue.  HENT:  Negative for congestion.   Eyes:  Negative for blurred vision.  Respiratory:  Positive for cough and shortness of breath. Negative for sputum production and wheezing.   Cardiovascular:  Negative for chest pain, palpitations and leg swelling.  Gastrointestinal:  Negative for abdominal pain, blood in stool and nausea.  Genitourinary:  Negative for dysuria and frequency.  Musculoskeletal:  Negative for falls.  Skin:  Negative for rash.  Neurological:  Negative for dizziness, loss of consciousness and headaches.  Endo/Heme/Allergies:  Negative for environmental allergies.  Psychiatric/Behavioral:  Negative for depression. The patient is not nervous/anxious.       Objective:    Physical Exam Constitutional:      General: She is not in acute distress.    Appearance: She is well-developed.  HENT:     Head: Normocephalic and atraumatic.  Eyes:     Conjunctiva/sclera: Conjunctivae normal.  Neck:     Thyroid: No thyromegaly.  Cardiovascular:     Rate and Rhythm: Normal rate and regular rhythm.     Heart sounds: Normal heart sounds. No murmur heard. Pulmonary:     Effort: Pulmonary effort is normal. No respiratory distress.     Breath sounds: Normal breath sounds.  Abdominal:     General: Bowel sounds are normal. There is no distension.     Palpations: Abdomen is soft. There is no mass.     Tenderness: There is no abdominal tenderness.  Musculoskeletal:     Cervical back: Neck supple.  Lymphadenopathy:     Cervical: No cervical adenopathy.  Skin:    General: Skin is warm and dry.  Neurological:     Mental Status: She is alert and oriented to person, place, and time.  Psychiatric:        Behavior: Behavior normal.    BP 126/74   Pulse  99   Temp 98.2 F (36.8 C)   Resp 16   Wt 267 lb 9.6 oz (121.4 kg)   SpO2 96%   BMI 45.93 kg/m  Wt Readings from Last 3 Encounters:  04/24/21 267 lb 9.6 oz (121.4 kg)  03/20/21 262 lb (118.8 kg)  10/16/20 267 lb 9.6 oz (121.4 kg)  Diabetic Foot Exam - Simple   No data filed    Lab Results  Component Value Date   WBC 7.8 10/16/2020   HGB 14.8 10/16/2020   HCT 44.2 10/16/2020   PLT 272.0 10/16/2020   GLUCOSE 92 10/16/2020   CHOL 256 (H) 10/16/2020   TRIG 170.0 (H) 10/16/2020   HDL 46.00 10/16/2020   LDLDIRECT 168.3 06/02/2014   LDLCALC 176 (H) 10/16/2020   ALT 17 10/16/2020   AST 15 10/16/2020   NA 140 10/16/2020   K 4.4 10/16/2020   CL 104 10/16/2020   CREATININE 1.01 10/16/2020   BUN 13 10/16/2020   CO2 28 10/16/2020   TSH 17.46 (H) 10/16/2020   HGBA1C 5.5 05/11/2018    Lab Results  Component Value Date   TSH 17.46 (H) 10/16/2020   Lab Results  Component Value Date   WBC 7.8 10/16/2020   HGB 14.8 10/16/2020   HCT 44.2 10/16/2020   MCV 88.2 10/16/2020   PLT 272.0 10/16/2020   Lab Results  Component Value Date   NA 140 10/16/2020   K 4.4 10/16/2020   CO2 28 10/16/2020   GLUCOSE 92 10/16/2020   BUN 13 10/16/2020   CREATININE 1.01 10/16/2020   BILITOT 0.5 10/16/2020   ALKPHOS 57 10/16/2020   AST 15 10/16/2020   ALT 17 10/16/2020   PROT 7.5 10/16/2020   ALBUMIN 4.1 10/16/2020   CALCIUM 9.6 10/16/2020   GFR 63.90 10/16/2020   Lab Results  Component Value Date   CHOL 256 (H) 10/16/2020   Lab Results  Component Value Date   HDL 46.00 10/16/2020   Lab Results  Component Value Date   LDLCALC 176 (H) 10/16/2020   Lab Results  Component Value Date   TRIG 170.0 (H) 10/16/2020   Lab Results  Component Value Date   CHOLHDL 6 10/16/2020   Lab Results  Component Value Date   HGBA1C 5.5 05/11/2018       Assessment & Plan:   Problem List Items Addressed This Visit     Hypothyroid    On Levothyroxine, continue to monitor, TSH was  elevated at last check      Relevant Orders   TSH   Vitamin B 12 deficiency    Supplement and monitor      Relevant Orders   Vitamin B12   Obesity    Encouraged DASH or MIND diet, decrease po intake and increase exercise as tolerated. Needs 7-8 hours of sleep nightly. Avoid trans fats, eat small, frequent meals every 4-5 hours with lean proteins, complex carbs and healthy fats. Minimize simple carbs, high fat foods and processed foods. Consider [phone] or Saxenda      Hyperlipidemia    Encourage heart healthy diet such as MIND or DASH diet, increase exercise, avoid trans fats, simple carbohydrates and processed foods, consider a krill or fish or flaxseed oil cap daily.       Relevant Orders   Comprehensive metabolic panel   Lipid panel   Hyperglycemia    hgba1c acceptable, minimize simple carbs. Increase exercise as tolerated.       Relevant Orders   Hemoglobin A1c   Vitamin D deficiency    Supplement and monitor      Relevant Orders   VITAMIN D 25 Hydroxy (Vit-D Deficiency, Fractures)   Other Visit Diagnoses     Need for influenza vaccination    -  Primary   Relevant Orders   Flu Vaccine QUAD 36+ mos IM (Fluarix, Fluzone &  Afluria Quad PF (Completed)   Candidiasis of skin       Relevant Medications   nystatin cream (MYCOSTATIN)   Other Relevant Orders   CBC   T4, free       I have discontinued [name] "Kim"'s ipratropium and benzonatate. I am also having her start on nystatin cream. Additionally, I am having her maintain her levonorgestrel, Omega-3 Fatty Acids (OMEGA 3 PO), levothyroxine, omeprazole, Cholecalciferol, cyanocobalamin, and sertraline.  Meds ordered this encounter  Medications   nystatin cream (MYCOSTATIN)    Sig: Apply 1 application topically 2 (two) times daily.    Dispense:  30 g    Refill:  2     Danise Edge, MD

## 2021-04-25 ENCOUNTER — Other Ambulatory Visit: Payer: Self-pay

## 2021-04-25 DIAGNOSIS — E538 Deficiency of other specified B group vitamins: Secondary | ICD-10-CM

## 2021-04-25 DIAGNOSIS — E559 Vitamin D deficiency, unspecified: Secondary | ICD-10-CM

## 2021-04-25 DIAGNOSIS — E785 Hyperlipidemia, unspecified: Secondary | ICD-10-CM

## 2021-04-25 DIAGNOSIS — R739 Hyperglycemia, unspecified: Secondary | ICD-10-CM

## 2021-04-25 DIAGNOSIS — E039 Hypothyroidism, unspecified: Secondary | ICD-10-CM

## 2021-04-25 MED ORDER — LEVOTHYROXINE SODIUM 200 MCG PO TABS: 200.0000 ug | ORAL_TABLET | Freq: Every day | ORAL | 1 refills | Status: DC

## 2021-04-25 MED ORDER — ATORVASTATIN CALCIUM 10 MG PO TABS: 10.0000 mg | ORAL_TABLET | Freq: Every day | ORAL | 1 refills | Status: DC

## 2021-05-07 ENCOUNTER — Ambulatory Visit: Payer: BC Managed Care – PPO | Admitting: Cardiology

## 2021-05-09 ENCOUNTER — Encounter: Payer: Self-pay | Admitting: Pulmonary Disease

## 2021-05-09 ENCOUNTER — Ambulatory Visit: Payer: BC Managed Care – PPO | Admitting: Pulmonary Disease

## 2021-05-09 ENCOUNTER — Other Ambulatory Visit: Payer: Self-pay

## 2021-05-09 VITALS — BP 126/86 | HR 90 | Temp 98.1°F | Ht 65.0 in | Wt 268.2 lb

## 2021-05-09 DIAGNOSIS — G4733 Obstructive sleep apnea (adult) (pediatric): Secondary | ICD-10-CM

## 2021-05-09 NOTE — Progress Notes (Signed)
Subjective:    Patient ID: Tiffany Morris, female    DOB: July 02, 1965, 55 y.o.   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  She now works for hospice of [facility]  OSA was diagnosed in 2015, she was placed on CPAP of 8 cm and she settled down with a small full facemask with good improvement in her daytime somnolence, fatigue and snoring.  She has been very compliant with CPAP and cannot sleep without it.  Lately she feels that CPAP is not working properly and her husband has noted snoring through the machine.  She wonders if pressure needs to be increased she would also like a replacement CPAP. Epworth sleepiness score is 2  Bedtime is between 9 PM and 10 PM, sleep latency about 15 minutes, she sleeps on her side on her stomach without any pillows, denies nocturnal awakenings and is out of bed between 7 AM to 7:30 AM feeling rested except for a week without dryness of mouth or headaches She has gained 12 pounds to her current weight of 268 pounds since the sleep study  There is no history suggestive of cataplexy, sleep paralysis or parasomnias  PMH -hypothyroidism, hyperlipidemia TSH was found to be high and Synthroid has been increased to 200 mcg  Significant tests/ events reviewed  NPSG 07/2013:  AHI 24/hr, cpap to 8 cm.   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   Qualifier: Diagnosis of  By: Assunta Found MD, Annie Main  H/o of hyper secondary to thyrotoxicosis    Hyperglycemia 06/05/2014   Hyperlipidemia 09/05/2011   hypothyroidism 06/02/2010   Obesity    Pain of left breast 08/05/2011   Pedal edema 08/05/2011   Thyroid disease    hyper   Unspecified sleep apnea 08/20/2013   Vitamin B 12 deficiency     Past Surgical History:  Procedure Laterality Date   CESAREAN SECTION  2004   CHOLECYSTECTOMY     laparoscopic   COLONOSCOPY  2014    RETINAL DETACHMENT REPAIR W/ SCLERAL BUCKLE LE  01-25-10    Allergies  Allergen Reactions   Codeine Nausea And Vomiting   Bee Venom Other (See Comments)    Social History   Socioeconomic History   Marital status: Married    Spouse name: Not on file   Number of children: Not on file   Years of education: Not on file   Highest education level: Not on file  Occupational History   Occupation: NP    Employer: [employer]  Tobacco Use   Smoking status: Never   Smokeless tobacco: Current  Substance and Sexual Activity   Alcohol use: No    Alcohol/week: 0.0 standard drinks   Drug use: No   Sexual activity: Not Currently    Comment: lives with husband, son age 34, no dietary restrictions.. 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    Social Determinants of Health   Financial Resource Strain: Not on file  Food Insecurity: Not on file  Transportation Needs: Not on file  Physical Activity: Not on file  Stress: Not on file  Social Connections: Not on file  Intimate Partner Violence: Not on file  Review of Systems   Constitutional: negative for anorexia, fevers and sweats  Eyes: negative for irritation, redness and visual disturbance  Ears, nose, mouth, throat, and face: negative for earaches, epistaxis, nasal congestion and sore throat  Respiratory: negative for cough, dyspnea on exertion, sputum and wheezing  Cardiovascular: negative for chest pain, dyspnea, lower extremity edema, orthopnea, palpitations and syncope  Gastrointestinal: negative for abdominal pain, constipation, diarrhea, melena, nausea and vomiting  Genitourinary:negative for dysuria, frequency and hematuria  Hematologic/lymphatic: negative for bleeding, easy bruising and lymphadenopathy  Musculoskeletal:negative for arthralgias, muscle weakness and stiff  joints  Neurological: negative for coordination problems, gait problems, headaches and weakness  Endocrine: negative for diabetic symptoms including polydipsia, polyuria and weight loss     Objective:   Physical Exam  Gen. Pleasant, obese, in no distress, normal affect ENT - no pallor,icterus, no post nasal drip, class 2-3 airway Neck: No JVD, no thyromegaly, no carotid bruits Lungs: no use of accessory muscles, no dullness to percussion, decreased without rales or rhonchi  Cardiovascular: Rhythm regular, heart sounds  normal, no murmurs or gallops, no peripheral edema Abdomen: soft and non-tender, no hepatosplenomegaly, BS normal. Musculoskeletal: No deformities, no cyanosis or clubbing Neuro:  alert, non focal, no tremors       Assessment & Plan:

## 2021-05-09 NOTE — Patient Instructions (Signed)
X Rx for autoCPAP 8-12 cm Check download 1 month after use

## 2021-05-09 NOTE — Assessment & Plan Note (Signed)
It is possible that due to her weight gain, she needs a higher pressure on her CPAP machine.  She is very compliant and CPAP is certainly helped improve her daytime somnolence and fatigue.  She would qualify for replacement machine.  We will send in a prescription for auto CPAP 8 to 12 cm.  After she has used this for about a month we will check a download and tweak settings to the new pressure. CPAP supplies will be renewed as needed  The pathophysiology of obstructive sleep apnea , it's cardiovascular consequences & modes of treatment including CPAP were discused with the patient in detail & they evidenced understanding. Weight loss encouraged, compliance with goal of at least 4-6 hrs every night is the expectation. Advised against medications with sedative side effects Cautioned against driving when sleepy - understanding that sleepiness will vary on a day to day basis

## 2021-05-13 DIAGNOSIS — G4733 Obstructive sleep apnea (adult) (pediatric): Secondary | ICD-10-CM | POA: Diagnosis not present

## 2021-06-08 ENCOUNTER — Other Ambulatory Visit: Payer: Self-pay | Admitting: Family Medicine

## 2021-06-08 ENCOUNTER — Encounter: Payer: Self-pay | Admitting: Family Medicine

## 2021-06-08 MED ORDER — SERTRALINE HCL 100 MG PO TABS: 100.0000 mg | ORAL_TABLET | Freq: Every day | ORAL | 2 refills | Status: DC

## 2021-06-12 ENCOUNTER — Other Ambulatory Visit: Payer: Self-pay | Admitting: Family Medicine

## 2021-06-13 DIAGNOSIS — G4733 Obstructive sleep apnea (adult) (pediatric): Secondary | ICD-10-CM | POA: Diagnosis not present

## 2021-07-10 ENCOUNTER — Encounter: Payer: Self-pay | Admitting: Family Medicine

## 2021-07-10 ENCOUNTER — Ambulatory Visit: Payer: BC Managed Care – PPO | Admitting: Family Medicine

## 2021-07-10 VITALS — BP 113/82 | HR 90 | Temp 98.5°F | Ht 65.0 in | Wt 274.1 lb

## 2021-07-10 DIAGNOSIS — M7918 Myalgia, other site: Secondary | ICD-10-CM

## 2021-07-10 DIAGNOSIS — G8929 Other chronic pain: Secondary | ICD-10-CM | POA: Diagnosis not present

## 2021-07-10 MED ORDER — PREDNISONE 20 MG PO TABS: 40.0000 mg | ORAL_TABLET | Freq: Every day | ORAL | 0 refills | Status: AC

## 2021-07-10 MED ORDER — TIZANIDINE HCL 4 MG PO TABS: 4.0000 mg | ORAL_TABLET | Freq: Four times a day (QID) | ORAL | 0 refills | Status: DC | PRN

## 2021-07-10 NOTE — Patient Instructions (Signed)
Heat (pad or rice pillow in microwave) over affected area, 10-15 minutes twice daily.   Ice/cold pack over area for 10-15 min twice daily.  OK to take Tylenol 1000 mg (2 extra strength tabs) or 975 mg (3 regular strength tabs) every 6 hours as needed.  Let us know if you need anything.  Gluteus Medius Syndrome Rehab It is normal to feel mild stretching, pulling, tightness, or discomfort as you do these exercises, but you should stop right away if you feel sudden pain or your pain gets worse.   Stretching and range of motion exercise This exercise warms up your muscles and joints and improves the movement and flexibility of your hip and pelvis. This exercise also helps to relieve pain and stiffness. Exercise A: Lunge (hip flexor stretch)      Kneel on the floor on your left / right knee. Bend your other knee so it is directly over your ankle. Keep good posture with your head over your shoulders. Tuck your tailbone underneath you. This will prevent your back from arching too much. You should feel a gentle stretch in the front of your thigh or hip. If you do not feel a stretch, slowly lunge forward with your chest up. Hold this position for 30 seconds. Slowly return to the starting position. Repeat 2 times. Complete this exercise 3 times per week. Strengthening exercises These exercises build strength and endurance in your hip and pelvis. Endurance is the ability to use your muscles for a long time, even after they get tired. Exercise B: Bridge (hip extensors)     Lie on your back on a firm surface with your knees bent and your feet flat on the floor. Tighten your buttocks muscles and lift your bottom off the floor until the trunk of your body is level with your thighs. You should feel the muscles working in your buttocks and the back of your thighs. If this exercise is too easy, cross your arms over your chest or lift one leg while your bottom is up off the floor. Do not arch your  back. Hold this position for 3 seconds. Slowly lower your hips to the starting position. Let your muscles relax completely between repetitions. Repeat 2 times. Complete this exercise 3 times per week. Exercise C: Straight leg raises (hip abductors)     Lie on your side with your left / right leg in the top position. Lie so your head, shoulder, knee, and hip line up. Bend your bottom knee to help you balance. Lift your top leg up 4-6 inches (10-15 cm), keeping your toes pointed straight ahead. Hold this position for 2 seconds. Slowly lower your leg to the starting position and let your muscles relax completely. Repeat for a total of 10 repetitions. Repeat 2 times. Complete this exercise 3 times per week. Exercise D: Hip abductors and external rotators, quadruped Get on your hands and knees on a firm, lightly padded surface. Your hands should be directly below your shoulders, and your knees should be directly below your hips. Lift your left / right knee out to the side. Keep your knee bent. Do not twist your body. Hold this position for 3 seconds. Slowly lower your leg. Repeat for a total of 10 repetitions.  Repeat 2 times. Complete this exercise 3 times per week. Exercise E: Single leg stand Stand near a counter or door frame to hold onto as needed. It is helpful to look in a mirror for this exercise so you can   watch your hip. Squeeze your left / right buttock muscles then lift up your other foot. Do not let your left / right hip push out to the side. Hold this position for 3 seconds. Repeat for a total of 10 repetitions. Repeat 2 times. Complete this exercise 3 times per week. Make sure you discuss any questions you have with your health care provider. Document Released: 05/13/2005 Document Revised: 01/18/2016 Document Reviewed: 04/25/2015 Elsevier Interactive Patient Education  2018 Elsevier Inc.  

## 2021-07-10 NOTE — Progress Notes (Signed)
Musculoskeletal Exam  Patient: Tiffany Morris DOB: Sep 27, 1965  DOS: 07/10/2021  SUBJECTIVE:  Chief Complaint:   Chief Complaint  Patient presents with   Hip Pain    left    Tiffany Morris is a 56 y.o.  female for evaluation and treatment of L hip pain.   Onset:  2 days ago flared, started during her last pregnancy 18 yrs ago. No recent inj or change in activity.  Location: outer hip region Character:  dull  Progression of issue:  has worsened Associated symptoms: Decreased ROM No redness, bruising, swelling.  Treatment: to date has been OTC NSAIDS, acetaminophen, and muscle relaxers.   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 No acute distress. Thorax & Lungs: No accessory muscle use Musculoskeletal: Left hip.   Normal active range of motion: no.   Normal passive range of motion: No, decreased flexion Tenderness to palpation: Mild tenderness over the proximal gluteal region; there is no tenderness over the greater trochanteric region; there is no tenderness over the lumbar region Deformity: no Ecchymosis: no Tests positive: None Tests negative: Logroll, Stinchfield, FABER, FADDIR Neurologic: Normal sensory function.  Gait antalgic. Psychiatric: Normal mood. Age appropriate judgment and insight. Alert &  oriented x 3.    Assessment:  Chronic gluteal pain - Plan: Ambulatory referral to Physical Therapy, tiZANidine (ZANAFLEX) 4 MG tablet, predniSONE (DELTASONE) 20 MG tablet  Plan: Exacerbation of chronic issue.  Given the duration, will refer to physical therapy.  5-day prednisone burst 40 mg daily.  Zanaflex 4 mg as needed.  Stretches/exercises, heat, ice, Tylenol.  F/u as originally scheduled with her regular PCP. The patient voiced understanding and agreement to the plan.   Peck, DO 07/10/21  3:53 PM

## 2021-07-12 ENCOUNTER — Other Ambulatory Visit: Payer: Self-pay | Admitting: Family Medicine

## 2021-07-18 ENCOUNTER — Ambulatory Visit: Payer: BC Managed Care – PPO | Attending: Family Medicine

## 2021-07-18 ENCOUNTER — Other Ambulatory Visit: Payer: Self-pay

## 2021-07-18 DIAGNOSIS — G8929 Other chronic pain: Secondary | ICD-10-CM | POA: Diagnosis not present

## 2021-07-18 DIAGNOSIS — M25552 Pain in left hip: Secondary | ICD-10-CM | POA: Diagnosis not present

## 2021-07-18 DIAGNOSIS — M7918 Myalgia, other site: Secondary | ICD-10-CM | POA: Diagnosis not present

## 2021-07-18 NOTE — Therapy (Addendum)
[facility] [place], [place], [address] Phone: [phone]   Fax:  [phone]  Physical Therapy Evaluation  Patient Details  Name: Tiffany Morris MRN: [id number] Date of Birth: [birth year] Referring Provider (PT): [place], [place]   Encounter Date: 07/18/2021   PT End of Session - 07/18/21 0908     Visit Number 1    Number of Visits 4    Date for PT Re-Evaluation 08/31/21    PT Start Time 0909    PT Stop Time 0943    PT Time Calculation (min) 34 min    Activity Tolerance Patient tolerated treatment well    Behavior During Therapy [facility] for tasks assessed/performed             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   Qualifier: Diagnosis of  By: Assunta Found MD, Annie Main  H/o of hyper secondary to thyrotoxicosis    Hyperglycemia 06/05/2014   Hyperlipidemia 09/05/2011   hypothyroidism 06/02/2010   Obesity    Pain of left breast 08/05/2011   Pedal edema 08/05/2011   Thyroid disease    hyper   Unspecified sleep apnea 08/20/2013   Vitamin B 12 deficiency     Past Surgical History:  Procedure Laterality Date   CESAREAN SECTION  2004   CHOLECYSTECTOMY     laparoscopic   COLONOSCOPY  2014   RETINAL DETACHMENT REPAIR W/ SCLERAL BUCKLE LE  01-25-10    There were no vitals filed for this visit.    Subjective Assessment - 07/18/21 0908     Subjective Patient reports that she has been having left hip pain for over 18 years. She notes that this first started when she was pregnant. She has experienced multiple flare ups over the years, but these typically only last a few days. She notes that this most recent flare up has not been as bad because she caught it earlier than previous exacerbations.    Pertinent History ADD    Limitations Sitting;Standing    How long can you sit comfortably? 30 minutes at most    Patient Stated Goals reduced  pain, learn more about her pain, be able to sit and drive longer    Currently in Pain? Yes    Pain Score 7     Pain Location Hip    Pain Orientation Left    Pain Descriptors / Indicators Dull;Aching;Other (Comment)   Deep   Pain Type Chronic pain    Pain Onset More than a month ago    Pain Frequency Constant    Aggravating Factors  sitting, driving,    Pain Relieving Factors walking    Effect of Pain on Daily Activities she has to take breaks throughout activities such as washing dishes or other prolonged standing activities                [facility] PT Assessment - 07/18/21 0001       Assessment   Medical Diagnosis Chronic gluteal pain    Referring Provider (PT) Nani Ravens, DO    Onset Date/Surgical Date --   18 years ago   Next MD Visit 09/13/21    Prior Therapy No      Precautions   Precautions None      Restrictions   Weight Bearing Restrictions No      Balance Screen   Has the patient fallen in the past 6  months No    Has the patient had a decrease in activity level because of a fear of falling?  No    Is the patient reluctant to leave their home because of a fear of falling?  No      Home Ecologist residence    Home Access Stairs to enter    Entrance Stairs-Number of Steps 2    [place] One level      Prior Function   Level of Independence Independent    Vocation Full time employment    Vocation Requirements prolonged driving    Leisure spend time with her family, play with her dog      Cognition   Overall Cognitive Status Within Functional Limits for tasks assessed    Memory Appears intact    Awareness Appears intact    Problem Solving Appears intact      Observation/Other Assessments   Observations Right weight shift in sitting      Sensation   Additional Comments Patient reports intermittent numbness when sitting, but none currently      ROM / Strength   AROM / PROM / Strength Strength;AROM;PROM      AROM   AROM  Assessment Site Lumbar;Hip    Right/Left Hip Left    Left Hip Flexion 95   familiar left hip pain   Left Hip ABduction 29    Lumbar Flexion 60    Lumbar Extension 20   familiar left hip pain   Lumbar - Right Side Bend WFL    Lumbar - Left Side Bend WFL    Lumbar - Right Rotation WFL    Lumbar - Left Rotation WFL      PROM   PROM Assessment Site Hip    Right/Left Hip Left    Left Hip Extension --   nonpainful   Left Hip Flexion --   "pulling" in posterior hip   Left Hip External Rotation  --   nonpainful   Left Hip Internal Rotation  --   nonpainful     Strength   Strength Assessment Site Hip;Knee    Right/Left Hip Right;Left    Right Hip Flexion 5/5    Left Hip Flexion 4+/5   pain when returning her hip to neutral   Left Hip ABduction 4+/5    Right/Left Knee Right;Left    Right Knee Flexion 4+/5    Right Knee Extension 4+/5    Left Knee Flexion 5/5    Left Knee Extension 5/5      Palpation   Spinal mobility Lumbar: WFL and nonpainful    Palpation comment No tenderness to palpation      Special Tests    Special Tests Lumbar;Hip Special Tests    Lumbar Tests Straight Leg Raise    Hip Special Tests  Hip Scouring      Straight Leg Raise   Findings Negative      Hip Scouring   Findings Negative    Side Left                        Objective measurements completed on examination: See above findings.                     PT Long Term Goals - 07/18/21 1215       PT LONG TERM GOAL #1   Title Patient will be independent with her HEP.    Time  4    Period Weeks    Status New    Target Date 08/15/21      PT LONG TERM GOAL #2   Title Patient will be able to drive at least 45 minutes without being limited by her left hip for improved function with her job demands.    Time 4    Period Weeks    Status New    Target Date 08/15/21      PT LONG TERM GOAL #3   Title Patient will be able to complete her job demands without her familiar left  hip pain exceeding a 4/10.    Time 4    Period Weeks    Status New    Target Date 08/15/21                    Plan - 07/18/21 0953     Clinical Impression Statement Patient is a 56 year old female presenting with left hip pain consistent with piriformis syndrome. She presented today with very low pain severity and irritability. Her pain was only able to be reproduced with resisted hip flexion, lumbar extension, and hip flexion. Recommend that she continue with skilled physical therapy to address her remaining impairments to return to her prior level of function.    Personal Factors and Comorbidities Time since onset of injury/illness/exacerbation    Examination-Activity Limitations Sit    Examination-Participation Restrictions Occupation;Driving    Stability/Clinical Decision Making Stable/Uncomplicated    Clinical Decision Making Low    Rehab Potential Excellent    PT Frequency 1x / week    PT Duration 4 weeks    PT Treatment/Interventions Electrical Stimulation;Moist Heat;Cryotherapy;Functional mobility training;Therapeutic activities;Therapeutic exercise;Neuromuscular re-education;Manual techniques;Patient/family education;Dry needling    PT Next Visit Plan nustep, hip strengthening, piriformis stretch, clams    Consulted and Agree with Plan of Care Patient             Patient will benefit from skilled therapeutic intervention in order to improve the following deficits and impairments:  Decreased range of motion, Pain, Decreased activity tolerance, Decreased strength  Visit Diagnosis: Pain in left hip     Problem List Patient Active Problem List   Diagnosis Date Noted   Candidiasis of skin 04/24/2021   Dyspnea 01/30/2021   Right elbow pain 04/10/2020   Vitamin D deficiency 05/11/2018   Hyperglycemia 06/05/2014   OSA on CPAP 09/22/2013   Hyperlipidemia 09/05/2011   Depression with anxiety 08/05/2011   Preventative health care 08/05/2011   Pedal edema  08/05/2011   Hypothyroid    Vitamin B 12 deficiency    Obesity    H/O thyrotoxicosis 06/02/2010   PHYSICAL THERAPY DISCHARGE SUMMARY  Visits from Start of Care: 1  Current functional level related to goals / functional outcomes: Patient is being discharged at this time as she has not returned since her initial evaluation.    Remaining deficits: See evaluation   Education / Equipment: HEP   Patient agrees to discharge. Patient goals were not met. Patient is being discharged due to not returning since the last visit.   Darlin Coco, PT 07/18/2021, 12:20 PM  [facility] [place], [place], [address] Phone: [phone]   Fax:  [phone]  Name: Tiffany Morris MRN: [id number] Date of Birth: [birth year]

## 2021-07-24 ENCOUNTER — Other Ambulatory Visit (INDEPENDENT_AMBULATORY_CARE_PROVIDER_SITE_OTHER): Payer: BC Managed Care – PPO

## 2021-07-24 DIAGNOSIS — E538 Deficiency of other specified B group vitamins: Secondary | ICD-10-CM

## 2021-07-24 DIAGNOSIS — E559 Vitamin D deficiency, unspecified: Secondary | ICD-10-CM

## 2021-07-24 DIAGNOSIS — E785 Hyperlipidemia, unspecified: Secondary | ICD-10-CM

## 2021-07-24 DIAGNOSIS — E039 Hypothyroidism, unspecified: Secondary | ICD-10-CM | POA: Diagnosis not present

## 2021-07-24 DIAGNOSIS — R739 Hyperglycemia, unspecified: Secondary | ICD-10-CM | POA: Diagnosis not present

## 2021-07-24 LAB — CBC WITH DIFFERENTIAL/PLATELET
Basophils Absolute: 0 10*3/uL (ref 0.0–0.1)
Basophils Relative: 0.6 % (ref 0.0–3.0)
Eosinophils Absolute: 0.2 10*3/uL (ref 0.0–0.7)
Eosinophils Relative: 2.6 % (ref 0.0–5.0)
HCT: 42.8 % (ref 36.0–46.0)
Hemoglobin: 13.9 g/dL (ref 12.0–15.0)
Lymphocytes Relative: 31.4 % (ref 12.0–46.0)
Lymphs Abs: 2.5 10*3/uL (ref 0.7–4.0)
MCHC: 32.6 g/dL (ref 30.0–36.0)
MCV: 88.4 fl (ref 78.0–100.0)
Monocytes Absolute: 0.7 10*3/uL (ref 0.1–1.0)
Monocytes Relative: 9 % (ref 3.0–12.0)
Neutro Abs: 4.5 10*3/uL (ref 1.4–7.7)
Neutrophils Relative %: 56.4 % (ref 43.0–77.0)
Platelets: 239 10*3/uL (ref 150.0–400.0)
RBC: 4.83 Mil/uL (ref 3.87–5.11)
RDW: 13.5 % (ref 11.5–15.5)
WBC: 8 10*3/uL (ref 4.0–10.5)

## 2021-07-24 LAB — COMPREHENSIVE METABOLIC PANEL
ALT: 15 U/L (ref 0–35)
AST: 13 U/L (ref 0–37)
Albumin: 3.8 g/dL (ref 3.5–5.2)
Alkaline Phosphatase: 70 U/L (ref 39–117)
BUN: 11 mg/dL (ref 6–23)
CO2: 26 mEq/L (ref 19–32)
Calcium: 9.1 mg/dL (ref 8.4–10.5)
Chloride: 106 mEq/L (ref 96–112)
Creatinine, Ser: 0.88 mg/dL (ref 0.40–1.20)
GFR: 74.98 mL/min (ref >60.00)
Glucose, Bld: 127 mg/dL — ABNORMAL HIGH (ref 70–99)
Potassium: 4 mEq/L (ref 3.5–5.1)
Sodium: 140 mEq/L (ref 135–145)
Total Bilirubin: 0.4 mg/dL (ref 0.2–1.2)
Total Protein: 6.9 g/dL (ref 6.0–8.3)

## 2021-07-24 LAB — LIPID PANEL
Cholesterol: 186 mg/dL (ref 0–200)
HDL: 46.3 mg/dL (ref >39.00)
LDL Cholesterol: 109 mg/dL — ABNORMAL HIGH (ref 0–99)
NonHDL: 140.08
Total CHOL/HDL Ratio: 4
Triglycerides: 155 mg/dL — ABNORMAL HIGH (ref 0.0–149.0)
VLDL: 31 mg/dL (ref 0.0–40.0)

## 2021-07-24 LAB — T4, FREE: Free T4: 0.91 ng/dL (ref 0.60–1.60)

## 2021-07-24 LAB — TSH: TSH: 3.09 u[IU]/mL (ref 0.35–5.50)

## 2021-07-24 LAB — HEMOGLOBIN A1C: Hgb A1c MFr Bld: 6.1 % (ref 4.6–6.5)

## 2021-07-24 LAB — VITAMIN B12: Vitamin B-12: 335 pg/mL (ref 211–911)

## 2021-07-27 LAB — VITAMIN D 1,25 DIHYDROXY
Vitamin D 1, 25 (OH)2 Total: 32 pg/mL (ref 18–72)
Vitamin D2 1, 25 (OH)2: <8 pg/mL
Vitamin D3 1, 25 (OH)2: 32 pg/mL

## 2021-08-15 ENCOUNTER — Other Ambulatory Visit: Payer: Self-pay

## 2021-08-15 ENCOUNTER — Encounter: Payer: Self-pay | Admitting: Adult Health

## 2021-08-15 ENCOUNTER — Ambulatory Visit (INDEPENDENT_AMBULATORY_CARE_PROVIDER_SITE_OTHER): Payer: BC Managed Care – PPO | Admitting: Adult Health

## 2021-08-15 VITALS — BP 122/79 | HR 69 | Ht 65.0 in | Wt 274.0 lb

## 2021-08-15 DIAGNOSIS — F411 Generalized anxiety disorder: Secondary | ICD-10-CM

## 2021-08-15 DIAGNOSIS — F908 Attention-deficit hyperactivity disorder, other type: Secondary | ICD-10-CM

## 2021-08-15 DIAGNOSIS — F331 Major depressive disorder, recurrent, moderate: Secondary | ICD-10-CM

## 2021-08-15 MED ORDER — AMPHETAMINE-DEXTROAMPHET ER 20 MG PO CP24: 20.0000 mg | ORAL_CAPSULE | Freq: Every day | ORAL | 0 refills | Status: DC

## 2021-08-15 MED ORDER — SERTRALINE HCL 100 MG PO TABS: 100.0000 mg | ORAL_TABLET | Freq: Every day | ORAL | 1 refills | Status: DC

## 2021-08-15 NOTE — Progress Notes (Signed)
[facility] MD/PA/NP Initial Note ? ?08/15/2021 2:06 PM ?Tiffany Morris  ?MRN:  [id number] ? ?Chief Complaint:  ? ?HPI:  ? ?Patient seen today for initial psychiatric evaluation.  ? ?Describes mood today as "ok". Pleasant. Denies tearfulness. Mood symptoms - reports some depression, anxiety and irritability - mood improved with recent increase in Zoloft from 50mg  to 100mg  daily through PCP. Denies mood inconsistencies. Concerned about issues with focus and concentration. Was seen at [place] and prescribed Vyvanse 10 years ago. Was started on Vyvanse, but did not tolerate it. Having issues in personal and professional life and would like to try a different medication to help manage symptoms. Stable interest and motivation. Taking other medications as prescribed. ?Energy levels lower. Active, does not have a regular exercise routine.  ?Enjoys some usual interests and activities. Married. Lives with husband of 21 years. Has one son - age 82. Parents and brother local. Spending time with family. ?Appetite adequate.  Weight stable - 274 pounds.Marland Kitchen ?Sleeps well most nights. Averages 7 to 8 hours. Uses CPAP machine. ?Focus and concentration stable. Completing tasks. Managing aspects of household. Works as a NP. ?Denies SI or HI.  ?Denies AH or VH. ? ?Previous medication trials:  Vyvanse, Wellbutrin, Cymbalta ? ?Visit Diagnosis:  ?  ICD-10-CM   ?1. Generalized anxiety disorder  F41.1 sertraline (ZOLOFT) 100 MG tablet  ?  ?2. Major depressive disorder, recurrent episode, moderate (HCC)  F33.1 sertraline (ZOLOFT) 100 MG tablet  ?  ?3. Attention deficit hyperactivity disorder (ADHD), other type  F90.8 amphetamine-dextroamphetamine (ADDERALL XR) 20 MG 24 hr capsule  ?  ? ? ?Past Psychiatric History: Denies psychiatric hospitalization.  ? ?Past Medical History:  ?Past Medical History:  ?Diagnosis Date  ? Chicken pox as a child  ? Depression with anxiety 08/05/2011  ? 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 Qualifier: Diagnosis of  By: Assunta Found  MD, Annie Main  H/o of hyper secondary to thyrotoxicosis   ? Hyperglycemia 06/05/2014  ? Hyperlipidemia 09/05/2011  ? hypothyroidism 06/02/2010  ? Obesity   ? Pain of left breast 08/05/2011  ? Pedal edema 08/05/2011  ? Thyroid disease   ? hyper  ? Unspecified sleep apnea 08/20/2013  ? Vitamin B 12 deficiency   ?  ?Past Surgical History:  ?Procedure Laterality Date  ? CESAREAN SECTION  2004  ? CHOLECYSTECTOMY    ? laparoscopic  ? COLONOSCOPY  2014  ? RETINAL DETACHMENT REPAIR W/ SCLERAL BUCKLE LE  01-25-10  ? ? ?Family Psychiatric History: Family history of mental illness - anxiety and depression.  ? ?Family History:  ?Family History  ?Problem Relation Age of Onset  ? Hyperlipidemia Mother   ? Diabetes Mother   ?     borderline  ? Deep vein thrombosis Mother   ? Hyperlipidemia Father   ? Hypertension Father   ? Diabetes Father   ?     type 2  ? Hyperthyroidism Father   ? Obesity Father   ? Cancer Maternal Grandmother   ?     lung- smoker  ? COPD Maternal Grandfather   ?     smoked  ? Cancer Maternal Grandfather   ?     bladder  ? Heart disease Paternal Grandmother   ?     CHF, MI  ? Hypothyroidism Paternal Grandmother   ? Hypertension Paternal Grandmother   ? Hypertension Paternal Grandfather   ? Heart disease Paternal Grandfather   ?     open heart surgery  ? Hyperlipidemia Paternal Grandfather   ?  Macular degeneration Paternal Grandfather   ? Autism spectrum disorder Son   ? ? ?Social History:  ?Social History  ? ?Socioeconomic History  ? Marital status: Married  ?  Spouse name: Not on file  ? Number of children: Not on file  ? Years of education: Not on file  ? Highest education level: Not on file  ?Occupational History  ? Occupation: NP  ?  Employer: [employer]  ?Tobacco Use  ? Smoking status: Never  ? Smokeless tobacco: Current  ?Substance and Sexual Activity  ? Alcohol use: No  ?  Alcohol/week: 0.0 standard drinks  ? Drug use: No  ? Sexual activity: Not Currently  ?  Comment: lives with husband, son age 52, no  dietary restrictions.. Nurse Practiitoner with [place]  ?Other Topics Concern  ? Not on file  ?Social History Narrative  ? Lives with husband   ? 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 No dietary restrictions.   ? ?Social Determinants of Health  ? ?Financial Resource Strain: Not on file  ?Food Insecurity: Not on file  ?Transportation Needs: Not on file  ?Physical Activity: Not on file  ?Stress: Not on file  ?Social Connections: Not on file  ? ? ?Allergies:  ?Allergies  ?Allergen Reactions  ? Codeine Nausea And Vomiting  ? Bee Venom Other (See Comments)  ? ? ?Metabolic Disorder Labs: ?Lab Results  ?Component Value Date  ? HGBA1C 6.1 07/24/2021  ? ?No results found for: PROLACTIN ?Lab Results  ?Component Value Date  ? CHOL 186 07/24/2021  ? TRIG 155.0 (H) 07/24/2021  ? HDL 46.30 07/24/2021  ? CHOLHDL 4 07/24/2021  ? VLDL 31.0 07/24/2021  ? LDLCALC 109 (H) 07/24/2021  ? [place] 197 (H) 04/24/2021  ? ?Lab Results  ?Component Value Date  ? TSH 3.09 07/24/2021  ? TSH 26.52 (H) 04/24/2021  ? ? ?Therapeutic Level Labs: ?No results found for: LITHIUM ?No results found for: VALPROATE ?No components found for:  CBMZ ? ?Current Medications: ?Current Outpatient Medications  ?Medication Sig Dispense Refill  ? amphetamine-dextroamphetamine (ADDERALL XR) 20 MG 24 hr capsule Take 1 capsule (20 mg total) by mouth daily. 30 capsule 0  ? atorvastatin (LIPITOR) 10 MG tablet Take 1 tablet (10 mg total) by mouth daily. 90 tablet 1  ? Cholecalciferol 25 MCG (1000 UT) tablet Take 3,000 Units by mouth.    ? cyanocobalamin 1000 MCG tablet Take by mouth.    ? levonorgestrel (MIRENA) 20 MCG/24HR IUD 1 each by Intrauterine route once.    ? levothyroxine (SYNTHROID) 200 MCG tablet Take 1 tablet (200 mcg total) by mouth daily. 90 tablet 1  ? nystatin cream (MYCOSTATIN) Apply 1 application topically 2 (two) times daily. 30 g 2  ? Omega-3 Fatty Acids (OMEGA 3 PO) Take by mouth daily. (Patient not taking: Reported on 07/18/2021)    ? omeprazole  (PRILOSEC) 40 MG capsule Take 40 mg by mouth daily.    ? sertraline (ZOLOFT) 100 MG tablet Take 1 tablet (100 mg total) by mouth daily. 90 tablet 1  ? tiZANidine (ZANAFLEX) 4 MG tablet Take 1 tablet (4 mg total) by mouth every 6 (six) hours as needed for muscle spasms. 30 tablet 0  ? ?No current facility-administered medications for this visit.  ? ? ?Medication Side Effects: none ? ?Orders placed this visit:  No orders of the defined types were placed in this encounter. ? ? ?Psychiatric Specialty Exam: ? ?Review of Systems  ?Musculoskeletal:  Negative for gait problem.  ?  Neurological:  Negative for tremors.  ?Psychiatric/Behavioral:    ?     Please refer to HPI   ?Blood pressure 122/79, pulse 69, height 5\' 5"  (1.651 m), weight 274 lb (124.3 kg).Body mass index is 45.6 kg/m?.  ?General Appearance: Casual and Neat  ?Eye Contact:  Good  ?Speech:  Clear and Coherent and Normal Rate  ?Volume:  Normal  ?Mood:  Euthymic  ?Affect:  Appropriate and Congruent  ?Thought Process:  Coherent and Descriptions of Associations: Intact  ?Orientation:  Full (Time, Place, and Person)  ?Thought Content: Logical   ?Suicidal Thoughts:  No  ?Homicidal Thoughts:  No  ?Memory:  WNL  ?Judgement:  Good  ?Insight:  Good  ?Psychomotor Activity:  Normal  ?Concentration:  Concentration: Good  ?Recall:  Good  ?Fund of Knowledge: Good  ?Language: Good  ?Assets:  Communication Skills ?Desire for Improvement ?Financial Resources/Insurance ?Housing ?Intimacy ?Leisure Time ?Physical Health ?Resilience ?Social Support ?Talents/Skills ?Transportation ?Vocational/Educational  ?ADL's:  Intact  ?Cognition: WNL  ?Prognosis:  Good  ? ?Screenings:  ?PHQ2-9   ? ?[place] Office Visit from 10/16/2020 in [facility] at [place] Visit from 01/16/2018 in [facility] at [facility]  ?PHQ-2 Total Score 2 0  ?PHQ-9 Total Score 6 --  ? ?  ? ?[place] ED from 03/20/2021 in [facility] at  [place]  ?C-SSRS RISK CATEGORY No Risk  ? ?  ? ? ?Receiving Psychotherapy: No  ? ?Treatment Plan/Recommendations:  ? ?Plan: ? ?PDMP reviewed ? ?Zoloft 100mg  daily ?Add Adderall XR 20mg  every day ? ?122/79/69

## 2021-09-12 ENCOUNTER — Ambulatory Visit: Payer: Self-pay | Admitting: Adult Health

## 2021-09-12 NOTE — Progress Notes (Signed)
? ? ?MyChart Video Visit ? ? ? ?Virtual Visit via Video Note  ? ?This visit type was conducted due to national recommendations for restrictions regarding the COVID-19 Pandemic (e.g. social distancing) in an effort to limit this patient's exposure and mitigate transmission in our community. This patient is at least at moderate risk for complications without adequate follow up. This format is felt to be most appropriate for this patient at this time. Physical exam was limited by quality of the video and audio technology used for the visit. Burt Ek., CMA was able to get the patient set up on a video visit. ? ?Patient location: home Patient and provider in visit ?Provider location: Office ? ?I discussed the limitations of evaluation and management by telemedicine and the availability of in person appointments. The patient expressed understanding and agreed to proceed. ? ?Visit Date: 09/13/2021 ? ?Today's healthcare provider: Danise Edge, MD  ? ? ? ?Subjective:  ? ? Patient ID: Tiffany Morris, female    DOB: Feb 03, 1966, 56 y.o.   MRN: [id number] ? ?Chief Complaint  ?Patient presents with  ? Medication follow up  ? Foot Pain  ?  L heel  ? ? ?HPI ?Patient is in today for a medication follow up. Overall she is doing well, no recent febrile illness or hospitalizations. No complaints of polyuria or polydipsia. She notes a good response to the increase of the Sertraline from 50 to 100 mg daily. No concerning side effects and she notes her anhedonia and anxiety are improved. She is following with behavioral health now as well. Denies CP/palp/SOB/HA/congestion/fevers/GI or GU c/o. Taking meds as prescribed. She is noting pain in her left heel especially with weight bearing. Is worsening.  ? ?Past Medical History:  ?Diagnosis Date  ? Chicken pox as a child  ? Depression with anxiety 08/05/2011  ? 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  Hyperglycemia 06/05/2014  ? Hyperlipidemia 09/05/2011  ? hypothyroidism 06/02/2010  ? Obesity   ? Pain of left breast 08/05/2011  ? Pedal edema 08/05/2011  ? Thyroid disease   ? hyper  ? Unspecified sleep apnea 08/20/2013  ? Vitamin B 12 deficiency   ? ? ?Past Surgical History:  ?Procedure Laterality Date  ? CESAREAN SECTION  2004  ? CHOLECYSTECTOMY    ? laparoscopic  ? COLONOSCOPY  2014  ? RETINAL DETACHMENT REPAIR W/ SCLERAL BUCKLE LE  01-25-10  ? ? ?Family History  ?Problem Relation Age of Onset  ? Hyperlipidemia Mother   ? Diabetes Mother   ?     borderline  ? Deep vein thrombosis Mother   ? Hyperlipidemia Father   ? Hypertension Father   ? Diabetes Father   ?     type 2  ? Hyperthyroidism Father   ? Obesity Father   ? Cancer Maternal Grandmother   ?     lung- smoker  ? COPD Maternal Grandfather   ?     smoked  ? Cancer Maternal Grandfather   ?     bladder  ? Heart disease Paternal Grandmother   ?     CHF, MI  ? Hypothyroidism Paternal Grandmother   ? Hypertension Paternal Grandmother   ? Hypertension Paternal Grandfather   ? Heart disease Paternal Grandfather   ?     open heart surgery  ? Hyperlipidemia Paternal Grandfather   ? Macular degeneration Paternal Grandfather   ? Autism  spectrum disorder Son   ? ? ?Social History  ? ?Socioeconomic History  ? Marital status: Married  ?  Spouse name: Not on file  ? Number of children: Not on file  ? Years of education: Not on file  ? Highest education level: Not on file  ?Occupational History  ? Occupation: NP  ?  Employer: [employer]  ?Tobacco Use  ? Smoking status: Never  ? Smokeless tobacco: Current  ?Substance and Sexual Activity  ? Alcohol use: No  ?  Alcohol/week: 0.0 standard drinks  ? Drug use: No  ? Sexual activity: Not Currently  ?  Comment: lives with husband, son age 70, no dietary restrictions.. Nurse Practiitoner with [place]  ?Other Topics Concern  ? Not on file  ?Social History Narrative  ? Lives with husband   ? 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 No  dietary restrictions.   ? ?Social Determinants of Health  ? ?Financial Resource Strain: Not on file  ?Food Insecurity: Not on file  ?Transportation Needs: Not on file  ?Physical Activity: Not on file  ?Stress: Not on file  ?Social Connections: Not on file  ?Intimate Partner Violence: Not on file  ? ? ?Outpatient Medications Prior to Visit  ?Medication Sig Dispense Refill  ? amphetamine-dextroamphetamine (ADDERALL XR) 20 MG 24 hr capsule Take 1 capsule (20 mg total) by mouth daily. 30 capsule 0  ? atorvastatin (LIPITOR) 10 MG tablet Take 1 tablet (10 mg total) by mouth daily. 90 tablet 1  ? Cholecalciferol 25 MCG (1000 UT) tablet Take 3,000 Units by mouth.    ? cyanocobalamin 1000 MCG tablet Take by mouth.    ? levonorgestrel (MIRENA) 20 MCG/24HR IUD 1 each by Intrauterine route once.    ? levothyroxine (SYNTHROID) 200 MCG tablet Take 1 tablet (200 mcg total) by mouth daily. 90 tablet 1  ? nystatin cream (MYCOSTATIN) Apply 1 application topically 2 (two) times daily. 30 g 2  ? omeprazole (PRILOSEC) 40 MG capsule Take 40 mg by mouth daily.    ? sertraline (ZOLOFT) 100 MG tablet Take 1 tablet (100 mg total) by mouth daily. 90 tablet 1  ? tiZANidine (ZANAFLEX) 4 MG tablet Take 1 tablet (4 mg total) by mouth every 6 (six) hours as needed for muscle spasms. 30 tablet 0  ? Omega-3 Fatty Acids (OMEGA 3 PO) Take by mouth daily. (Patient not taking: Reported on 07/18/2021)    ? ?No facility-administered medications prior to visit.  ? ? ?Allergies  ?Allergen Reactions  ? Codeine Nausea And Vomiting  ? Bee Venom Other (See Comments)  ? ? ?Review of Systems  ?Constitutional:  Negative for fever and malaise/fatigue.  ?HENT:  Negative for congestion.   ?Eyes:  Negative for blurred vision.  ?Respiratory:  Negative for shortness of breath.   ?Cardiovascular:  Negative for chest pain, palpitations and leg swelling.  ?Gastrointestinal:  Negative for abdominal pain, blood in stool and nausea.  ?Genitourinary:  Negative for dysuria and  frequency.  ?Musculoskeletal:  Positive for joint pain. Negative for falls.  ?Skin:  Negative for rash.  ?Neurological:  Negative for dizziness, loss of consciousness and headaches.  ?Endo/Heme/Allergies:  Negative for environmental allergies.  ?Psychiatric/Behavioral:  Negative for depression. The patient is not nervous/anxious.   ? ?   ?Objective:  ?  ?Physical Exam ?Constitutional:   ?   General: She is not in acute distress. ?   Appearance: Normal appearance. She is not ill-appearing or toxic-appearing.  ?HENT:  ?  Head: Normocephalic and atraumatic.  ?   Right Ear: External ear normal.  ?   Left Ear: External ear normal.  ?   Nose: Nose normal.  ?Eyes:  ?   General:     ?   Right eye: No discharge.     ?   Left eye: No discharge.  ?Pulmonary:  ?   Effort: Pulmonary effort is normal.  ?Skin: ?   Findings: No rash.  ?Neurological:  ?   Mental Status: She is alert and oriented to person, place, and time.  ?Psychiatric:     ?   Behavior: Behavior normal.  ? ? ?There were no vitals taken for this visit. ?Wt Readings from Last 3 Encounters:  ?07/10/21 274 lb 2 oz (124.3 kg)  ?05/09/21 268 lb 3.2 oz (121.7 kg)  ?04/24/21 267 lb 9.6 oz (121.4 kg)  ? ? ?Diabetic Foot Exam - Simple   ?No data filed ?  ? ?Lab Results  ?Component Value Date  ? WBC 8.0 07/24/2021  ? HGB 13.9 07/24/2021  ? HCT 42.8 07/24/2021  ? PLT 239.0 07/24/2021  ? GLUCOSE 127 (H) 07/24/2021  ? CHOL 186 07/24/2021  ? TRIG 155.0 (H) 07/24/2021  ? HDL 46.30 07/24/2021  ? LDLDIRECT 168.3 06/02/2014  ? LDLCALC 109 (H) 07/24/2021  ? ALT 15 07/24/2021  ? AST 13 07/24/2021  ? NA 140 07/24/2021  ? K 4.0 07/24/2021  ? CL 106 07/24/2021  ? CREATININE 0.88 07/24/2021  ? BUN 11 07/24/2021  ? CO2 26 07/24/2021  ? TSH 3.09 07/24/2021  ? HGBA1C 6.1 07/24/2021  ? ? ?Lab Results  ?Component Value Date  ? TSH 3.09 07/24/2021  ? ?Lab Results  ?Component Value Date  ? WBC 8.0 07/24/2021  ? HGB 13.9 07/24/2021  ? HCT 42.8 07/24/2021  ? MCV 88.4 07/24/2021  ? PLT 239.0  07/24/2021  ? ?Lab Results  ?Component Value Date  ? NA 140 07/24/2021  ? K 4.0 07/24/2021  ? CO2 26 07/24/2021  ? GLUCOSE 127 (H) 07/24/2021  ? BUN 11 07/24/2021  ? CREATININE 0.88 07/24/2021  ? BILITOT 0.4 02/2

## 2021-09-13 ENCOUNTER — Encounter: Payer: Self-pay | Admitting: Family Medicine

## 2021-09-13 ENCOUNTER — Telehealth (INDEPENDENT_AMBULATORY_CARE_PROVIDER_SITE_OTHER): Payer: Self-pay | Admitting: Family Medicine

## 2021-09-13 DIAGNOSIS — E559 Vitamin D deficiency, unspecified: Secondary | ICD-10-CM

## 2021-09-13 DIAGNOSIS — R739 Hyperglycemia, unspecified: Secondary | ICD-10-CM

## 2021-09-13 DIAGNOSIS — F418 Other specified anxiety disorders: Secondary | ICD-10-CM

## 2021-09-13 DIAGNOSIS — M79672 Pain in left foot: Secondary | ICD-10-CM

## 2021-09-13 DIAGNOSIS — E538 Deficiency of other specified B group vitamins: Secondary | ICD-10-CM

## 2021-09-16 DIAGNOSIS — M79672 Pain in left foot: Secondary | ICD-10-CM | POA: Insufficient documentation

## 2021-09-16 NOTE — Assessment & Plan Note (Signed)
minimize simple carbs. Increase exercise as tolerated.  

## 2021-09-16 NOTE — Assessment & Plan Note (Signed)
Supplement and monitor 

## 2021-09-16 NOTE — Assessment & Plan Note (Signed)
Good response to increase in Sertraline to 100 mg daily, she is now following with behavioral health. No changes ?

## 2021-09-16 NOTE — Assessment & Plan Note (Signed)
Encouraged stretching, ice good shoes with good arch support if no improvement then refer to podiatry for further evaluation.  ?

## 2021-10-29 NOTE — Progress Notes (Unsigned)
Subjective:    Patient ID: Tiffany Morris, female    DOB: May 11, 1966, 56 y.o.   MRN: [id number]  No chief complaint on file.   HPI Patient is in today for her annual physical exam.  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    Hyperglycemia 06/05/2014   Hyperlipidemia 09/05/2011   hypothyroidism 06/02/2010   Obesity    Pain of left breast 08/05/2011   Pedal edema 08/05/2011   Thyroid disease    hyper   Unspecified sleep apnea 08/20/2013   Vitamin B 12 deficiency     Past Surgical History:  Procedure Laterality Date   CESAREAN SECTION  2004   CHOLECYSTECTOMY     laparoscopic   COLONOSCOPY  2014   RETINAL DETACHMENT REPAIR W/ SCLERAL BUCKLE LE  01-25-10    Family History  Problem Relation Age of Onset   Hyperlipidemia Mother    Diabetes Mother        borderline   Deep vein thrombosis Mother    Hyperlipidemia Father    Hypertension Father    Diabetes Father        type 2   Hyperthyroidism Father    Obesity Father    Cancer Maternal Grandmother        lung- smoker   COPD Maternal Grandfather        smoked   Cancer Maternal Grandfather        bladder   Heart disease Paternal Grandmother        CHF, MI   Hypothyroidism Paternal Grandmother    Hypertension Paternal Grandmother    Hypertension Paternal Grandfather    Heart disease Paternal Grandfather        open heart surgery   Hyperlipidemia Paternal Grandfather    Macular degeneration Paternal Grandfather    Autism spectrum disorder Son     Social History   Socioeconomic History   Marital status: Married    Spouse name: Not on file   Number of children: Not on file   Years of education: Not on file   Highest education level: Not on file  Occupational History   Occupation: NP    Employer: [employer]  Tobacco Use    Smoking status: Never   Smokeless tobacco: Current  Substance and Sexual Activity   Alcohol use: No    Alcohol/week: 0.0 standard drinks   Drug use: No   Sexual activity: Not Currently    Comment: lives with husband, son age 27, no dietary restrictions.. 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    Social Determinants of Health   Financial Resource Strain: Not on file  Food Insecurity: Not on file  Transportation Needs: Not on file  Physical Activity: Not on file  Stress: Not on file  Social Connections: Not on file  Intimate Partner Violence: Not on file    Outpatient Medications Prior to Visit  Medication Sig Dispense Refill   amphetamine-dextroamphetamine (ADDERALL XR) 20 MG 24 hr capsule Take 1 capsule (20 mg total) by mouth daily. 30 capsule 0   atorvastatin (LIPITOR) 10 MG tablet Take 1 tablet (10 mg total) by  mouth daily. 90 tablet 1   Cholecalciferol 25 MCG (1000 UT) tablet Take 3,000 Units by mouth.     cyanocobalamin 1000 MCG tablet Take by mouth.     levonorgestrel (MIRENA) 20 MCG/24HR IUD 1 each by Intrauterine route once.     levothyroxine (SYNTHROID) 200 MCG tablet Take 1 tablet (200 mcg total) by mouth daily. 90 tablet 1   nystatin cream (MYCOSTATIN) Apply 1 application topically 2 (two) times daily. 30 g 2   omeprazole (PRILOSEC) 40 MG capsule Take 40 mg by mouth daily.     sertraline (ZOLOFT) 100 MG tablet Take 1 tablet (100 mg total) by mouth daily. 90 tablet 1   tiZANidine (ZANAFLEX) 4 MG tablet Take 1 tablet (4 mg total) by mouth every 6 (six) hours as needed for muscle spasms. 30 tablet 0   No facility-administered medications prior to visit.    Allergies  Allergen Reactions   Codeine Nausea And Vomiting   Bee Venom Other (See Comments)    ROS     Objective:    Physical Exam  There were no vitals taken  for this visit. Wt Readings from Last 3 Encounters:  07/10/21 274 lb 2 oz (124.3 kg)  05/09/21 268 lb 3.2 oz (121.7 kg)  04/24/21 267 lb 9.6 oz (121.4 kg)    Diabetic Foot Exam - Simple   No data filed    Lab Results  Component Value Date   WBC 8.0 07/24/2021   HGB 13.9 07/24/2021   HCT 42.8 07/24/2021   PLT 239.0 07/24/2021   GLUCOSE 127 (H) 07/24/2021   CHOL 186 07/24/2021   TRIG 155.0 (H) 07/24/2021   HDL 46.30 07/24/2021   LDLDIRECT 168.3 06/02/2014   LDLCALC 109 (H) 07/24/2021   ALT 15 07/24/2021   AST 13 07/24/2021   NA 140 07/24/2021   K 4.0 07/24/2021   CL 106 07/24/2021   CREATININE 0.88 07/24/2021   BUN 11 07/24/2021   CO2 26 07/24/2021   TSH 3.09 07/24/2021   HGBA1C 6.1 07/24/2021    Lab Results  Component Value Date   TSH 3.09 07/24/2021   Lab Results  Component Value Date   WBC 8.0 07/24/2021   HGB 13.9 07/24/2021   HCT 42.8 07/24/2021   MCV 88.4 07/24/2021   PLT 239.0 07/24/2021   Lab Results  Component Value Date   NA 140 07/24/2021   K 4.0 07/24/2021   CO2 26 07/24/2021   GLUCOSE 127 (H) 07/24/2021   BUN 11 07/24/2021   CREATININE 0.88 07/24/2021   BILITOT 0.4 07/24/2021   ALKPHOS 70 07/24/2021   AST 13 07/24/2021   ALT 15 07/24/2021   PROT 6.9 07/24/2021   ALBUMIN 3.8 07/24/2021   CALCIUM 9.1 07/24/2021   GFR 74.98 07/24/2021   Lab Results  Component Value Date   CHOL 186 07/24/2021   Lab Results  Component Value Date   HDL 46.30 07/24/2021   Lab Results  Component Value Date   LDLCALC 109 (H) 07/24/2021   Lab Results  Component Value Date   TRIG 155.0 (H) 07/24/2021   Lab Results  Component Value Date   CHOLHDL 4 07/24/2021   Lab Results  Component Value Date   HGBA1C 6.1 07/24/2021       Assessment & Plan:   Problem List Items Addressed This Visit   None   I am having Tiffany Leaven "Kim" maintain her levonorgestrel, omeprazole, Cholecalciferol, cyanocobalamin, nystatin cream, levothyroxine,  atorvastatin, tiZANidine, sertraline, and amphetamine-dextroamphetamine.  No orders of the defined types were placed in this encounter.

## 2021-10-30 ENCOUNTER — Encounter: Payer: Self-pay | Admitting: Family Medicine

## 2021-10-30 ENCOUNTER — Ambulatory Visit (INDEPENDENT_AMBULATORY_CARE_PROVIDER_SITE_OTHER): Payer: Self-pay | Admitting: Family Medicine

## 2021-10-30 VITALS — BP 110/80 | HR 80 | Resp 20 | Ht 65.0 in | Wt 270.0 lb

## 2021-10-30 DIAGNOSIS — R739 Hyperglycemia, unspecified: Secondary | ICD-10-CM

## 2021-10-30 DIAGNOSIS — E6609 Other obesity due to excess calories: Secondary | ICD-10-CM

## 2021-10-30 DIAGNOSIS — E559 Vitamin D deficiency, unspecified: Secondary | ICD-10-CM

## 2021-10-30 DIAGNOSIS — Z0000 Encounter for general adult medical examination without abnormal findings: Secondary | ICD-10-CM

## 2021-10-30 DIAGNOSIS — E538 Deficiency of other specified B group vitamins: Secondary | ICD-10-CM

## 2021-10-30 DIAGNOSIS — E785 Hyperlipidemia, unspecified: Secondary | ICD-10-CM

## 2021-10-30 DIAGNOSIS — E039 Hypothyroidism, unspecified: Secondary | ICD-10-CM

## 2021-10-30 LAB — TSH: TSH: 8.57 u[IU]/mL — ABNORMAL HIGH (ref 0.35–5.50)

## 2021-10-30 NOTE — Patient Instructions (Signed)
Shingrix is the new shingles shot, 2 shots over 2-6 months, confirm coverage with insurance and document, then can return here for shots with nurse appt or at pharmacy  Preventive Care 40-54 Years Old, Female Preventive care refers to lifestyle choices and visits with your health care provider that can promote health and wellness. Preventive care visits are also called wellness exams. What can I expect for my preventive care visit? Counseling Your health care provider may ask you questions about your: Medical history, including: Past medical problems. Family medical history. Pregnancy history. Current health, including: Menstrual cycle. Method of birth control. Emotional well-being. Home life and relationship well-being. Sexual activity and sexual health. Lifestyle, including: Alcohol, nicotine or tobacco, and drug use. Access to firearms. Diet, exercise, and sleep habits. Work and work environment. Sunscreen use. Safety issues such as seatbelt and bike helmet use. Physical exam Your health care provider will check your: Height and weight. These may be used to calculate your BMI (body mass index). BMI is a measurement that tells if you are at a healthy weight. Waist circumference. This measures the distance around your waistline. This measurement also tells if you are at a healthy weight and may help predict your risk of certain diseases, such as type 2 diabetes and high blood pressure. Heart rate and blood pressure. Body temperature. Skin for abnormal spots. What immunizations do I need?  Vaccines are usually given at various ages, according to a schedule. Your health care provider will recommend vaccines for you based on your age, medical history, and lifestyle or other factors, such as travel or where you work. What tests do I need? Screening Your health care provider may recommend screening tests for certain conditions. This may include: Lipid and cholesterol levels. Diabetes  screening. This is done by checking your blood sugar (glucose) after you have not eaten for a while (fasting). Pelvic exam and Pap test. Hepatitis B test. Hepatitis C test. HIV (human immunodeficiency virus) test. STI (sexually transmitted infection) testing, if you are at risk. Lung cancer screening. Colorectal cancer screening. Mammogram. Talk with your health care provider about when you should start having regular mammograms. This may depend on whether you have a family history of breast cancer. BRCA-related cancer screening. This may be done if you have a family history of breast, ovarian, tubal, or peritoneal cancers. Bone density scan. This is done to screen for osteoporosis. Talk with your health care provider about your test results, treatment options, and if necessary, the need for more tests. Follow these instructions at home: Eating and drinking  Eat a diet that includes fresh fruits and vegetables, whole grains, lean protein, and low-fat dairy products. Take vitamin and mineral supplements as recommended by your health care provider. Do not drink alcohol if: Your health care provider tells you not to drink. You are pregnant, may be pregnant, or are planning to become pregnant. If you drink alcohol: Limit how much you have to 0-1 drink a day. Know how much alcohol is in your drink. In the [place] one drink equals one 12 oz bottle of beer (355 mL), one 5 oz glass of wine (148 mL), or one 1 oz glass of hard liquor (44 mL). Lifestyle Brush your teeth every morning and night with fluoride toothpaste. Floss one time each day. Exercise for at least 30 minutes 5 or more days each week. Do not use any products that contain nicotine or tobacco. These products include cigarettes, chewing tobacco, and vaping devices, such as e-cigarettes. If   you need help quitting, ask your health care provider. Do not use drugs. If you are sexually active, practice safe sex. Use a condom or other form of  protection to prevent STIs. If you do not wish to become pregnant, use a form of birth control. If you plan to become pregnant, see your health care provider for a prepregnancy visit. Take aspirin only as told by your health care provider. Make sure that you understand how much to take and what form to take. Work with your health care provider to find out whether it is safe and beneficial for you to take aspirin daily. Find healthy ways to manage stress, such as: Meditation, yoga, or listening to music. Journaling. Talking to a trusted person. Spending time with friends and family. Minimize exposure to UV radiation to reduce your risk of skin cancer. Safety Always wear your seat belt while driving or riding in a vehicle. Do not drive: If you have been drinking alcohol. Do not ride with someone who has been drinking. When you are tired or distracted. While texting. If you have been using any mind-altering substances or drugs. Wear a helmet and other protective equipment during sports activities. If you have firearms in your house, make sure you follow all gun safety procedures. Seek help if you have been physically or sexually abused. What's next? Visit your health care provider once a year for an annual wellness visit. Ask your health care provider how often you should have your eyes and teeth checked. Stay up to date on all vaccines. This information is not intended to replace advice given to you by your health care provider. Make sure you discuss any questions you have with your health care provider. Document Revised: 11/08/2020 Document Reviewed: 11/08/2020 Elsevier Patient Education  2023 Elsevier Inc.  

## 2021-10-30 NOTE — Assessment & Plan Note (Signed)
Supplement and monitor 

## 2021-10-30 NOTE — Assessment & Plan Note (Signed)
On Levothyroxine, continue to monitor 

## 2021-10-30 NOTE — Assessment & Plan Note (Addendum)
Encouraged DASH or MIND diet, decrease po intake and increase exercise as tolerated. Needs 7-8 hours of sleep nightly. Avoid trans fats, eat small, frequent meals every 4-5 hours with lean proteins, complex carbs and healthy fats. Minimize simple carbs, high fat foods and processed foods. Patient will consider Wegovy or Saxenda.

## 2021-10-30 NOTE — Assessment & Plan Note (Signed)
hgba1c acceptable, minimize simple carbs. Increase exercise as tolerated.  

## 2021-10-30 NOTE — Assessment & Plan Note (Signed)
Encourage heart healthy diet such as MIND or DASH diet, increase exercise, avoid trans fats, simple carbohydrates and processed foods, consider a krill or fish or flaxseed oil cap daily.  °

## 2021-10-30 NOTE — Assessment & Plan Note (Addendum)
Patient encouraged to maintain heart healthy diet, regular exercise, adequate sleep. Consider daily probiotics. Take medications as prescribed. Labs ordered and reviewed. Shingrix is the new shingles shot, 2 shots over 2-6 months, confirm coverage with insurance and document, then can return here for shots with nurse appt or at pharmacy. Pap 2019 with GYN will repeat in next year. Colonoscopy 2013 repeat in October 2023. MM was November 2022, repeat later this year or next year.

## 2022-01-29 ENCOUNTER — Other Ambulatory Visit: Payer: Self-pay

## 2022-01-29 ENCOUNTER — Emergency Department (HOSPITAL_BASED_OUTPATIENT_CLINIC_OR_DEPARTMENT_OTHER): Payer: No Typology Code available for payment source | Admitting: Radiology

## 2022-01-29 ENCOUNTER — Other Ambulatory Visit (HOSPITAL_BASED_OUTPATIENT_CLINIC_OR_DEPARTMENT_OTHER): Payer: Self-pay

## 2022-01-29 ENCOUNTER — Encounter (HOSPITAL_BASED_OUTPATIENT_CLINIC_OR_DEPARTMENT_OTHER): Payer: Self-pay | Admitting: Obstetrics and Gynecology

## 2022-01-29 ENCOUNTER — Emergency Department (HOSPITAL_BASED_OUTPATIENT_CLINIC_OR_DEPARTMENT_OTHER)
Admission: EM | Admit: 2022-01-29 | Discharge: 2022-01-29 | Disposition: A | Discharge status: Home / Self Care | Payer: No Typology Code available for payment source | Attending: Emergency Medicine | Admitting: Emergency Medicine

## 2022-01-29 DIAGNOSIS — Y9389 Activity, other specified: Secondary | ICD-10-CM | POA: Insufficient documentation

## 2022-01-29 DIAGNOSIS — S39012A Strain of muscle, fascia and tendon of lower back, initial encounter: Secondary | ICD-10-CM | POA: Diagnosis not present

## 2022-01-29 DIAGNOSIS — X509XXA Other and unspecified overexertion or strenuous movements or postures, initial encounter: Secondary | ICD-10-CM | POA: Insufficient documentation

## 2022-01-29 DIAGNOSIS — S3992XA Unspecified injury of lower back, initial encounter: Secondary | ICD-10-CM | POA: Diagnosis present

## 2022-01-29 DIAGNOSIS — E039 Hypothyroidism, unspecified: Secondary | ICD-10-CM | POA: Diagnosis not present

## 2022-01-29 DIAGNOSIS — M7918 Myalgia, other site: Secondary | ICD-10-CM

## 2022-01-29 MED ORDER — KETOROLAC TROMETHAMINE 60 MG/2ML IM SOLN
60.0000 mg | Freq: Once | INTRAMUSCULAR | Status: AC
  Administered 2022-01-29: 60 mg via INTRAMUSCULAR
  Filled 2022-01-29: qty 2

## 2022-01-29 MED ORDER — TIZANIDINE HCL 4 MG PO TABS: 4.0000 mg | ORAL_TABLET | Freq: Four times a day (QID) | ORAL | 0 refills | Status: DC | PRN

## 2022-01-29 MED ORDER — CYCLOBENZAPRINE HCL 10 MG PO TABS
10.0000 mg | ORAL_TABLET | Freq: Once | ORAL | Status: AC
  Administered 2022-01-29: 10 mg via ORAL
  Filled 2022-01-29: qty 1

## 2022-01-29 MED ORDER — PREDNISONE 20 MG PO TABS
40.0000 mg | ORAL_TABLET | Freq: Every day | ORAL | 0 refills | Status: DC
  Filled 2022-01-29: qty 10, 5d supply, fill #0

## 2022-01-29 NOTE — Discharge Instructions (Signed)
Make sure you are still getting up and moving around.  If it is severely uncomfortable then avoid that activity.  You can take 2 Tylenol and 2 ibuprofen together every 6 hours for pain along with lidocaine patches and the steroids.  Make sure you are taking it with food so that it does not irritate your stomach.  If you start having issues where you are unable to control your bowel movements or unable to urinate or you develop weakness in your leg or inability to walk return to the emergency room.

## 2022-01-29 NOTE — ED Triage Notes (Signed)
Patient reports to the ER for back pain that has worsened since yesterday. Patient reports putting pressure on the left leg hurts worse but denies radiation. Patient denies injury. Patient also endorses diarrhea since last night.

## 2022-01-29 NOTE — ED Provider Notes (Signed)
[facility] EMERGENCY DEPT Provider Note   CSN: [id number] Arrival date & time: 01/29/22  6060     History  Chief Complaint  Patient presents with   Back Pain    Tiffany Morris is a 56 y.o. female.  Patient is a pleasant 56 year old female with a history of hypothyroidism, hyperlipidemia, GERD who is presenting today for left-sided back and hip pain.  Patient reports that it started yesterday suddenly as she was bending over and standing up unloading the dishwasher.  It is a severe uncomfortable pain in the left lower back and hip.  It does seem to be made worse by attempting to walk and putting weight on her leg she feels like there is a rod being shoved up her leg.  She denies any nausea or vomiting.  She has had no pain in her abdomen or urinary complaints.  She has had 2 episodes of diarrhea since the pain started.  She denies any numbness or tingling in the left lower extremity.  She does report a history of back pain at the end of last year beginning of this year that was not as severe as this but she did take a course of steroids, tizanidine and NSAIDs and symptoms resolved.  She has not had any recent falls.  Has no history of cancer or fevers or prior injections in the area.   Back Pain      Home Medications Prior to Admission medications   Medication Sig Start Date End Date Taking? Authorizing Provider  predniSONE (DELTASONE) 20 MG tablet Take 2 tablets (40 mg total) by mouth daily. 01/29/22  Yes Durelle Zepeda, Alphonzo Lemmings, MD  amphetamine-dextroamphetamine (ADDERALL XR) 20 MG 24 hr capsule Take 1 capsule (20 mg total) by mouth daily. 08/15/21   Mozingo, Thereasa Solo, NP  atorvastatin (LIPITOR) 10 MG tablet Take 1 tablet (10 mg total) by mouth daily. 04/25/21   Bradd Canary, MD  Cholecalciferol 25 MCG (1000 UT) tablet Take 5,000 Units by mouth.    [provider]  cyanocobalamin 1000 MCG tablet Take by mouth.    [provider]  levonorgestrel  (MIRENA) 20 MCG/24HR IUD 1 each by Intrauterine route once.    [provider]  levothyroxine (SYNTHROID) 200 MCG tablet Take 1 tablet (200 mcg total) by mouth daily. 04/25/21   Bradd Canary, MD  nystatin cream (MYCOSTATIN) Apply 1 application topically 2 (two) times daily. 04/24/21   Bradd Canary, MD  omeprazole (PRILOSEC) 40 MG capsule Take 40 mg by mouth daily. 03/18/21   [provider]  sertraline (ZOLOFT) 100 MG tablet Take 1 tablet (100 mg total) by mouth daily. 08/15/21   Mozingo, Thereasa Solo, NP  tiZANidine (ZANAFLEX) 4 MG tablet Take 1 tablet (4 mg total) by mouth every 6 (six) hours as needed for muscle spasms. 01/29/22   Gwyneth Sprout, MD      Allergies    Codeine and Bee venom    Review of Systems   Review of Systems  Musculoskeletal:  Positive for back pain.    Physical Exam Updated Vital Signs BP 125/70   Pulse 69   Temp 98 F (36.7 C) (Oral)   Resp 17   SpO2 95%  Physical Exam Vitals and nursing note reviewed.  Constitutional:      General: She is not in acute distress.    Appearance: She is well-developed.  HENT:     Head: Normocephalic and atraumatic.  Eyes:     Pupils: Pupils are  equal, round, and reactive to light.  Cardiovascular:     Rate and Rhythm: Normal rate and regular rhythm.     Heart sounds: Normal heart sounds. No murmur heard.    No friction rub.  Pulmonary:     Effort: Pulmonary effort is normal.     Breath sounds: Normal breath sounds. No wheezing or rales.  Abdominal:     General: Bowel sounds are normal. There is no distension.     Palpations: Abdomen is soft.     Tenderness: There is no abdominal tenderness. There is no right CVA tenderness, left CVA tenderness, guarding or rebound.  Musculoskeletal:     Left hip: Tenderness present. Decreased range of motion.       Legs:     Comments: No edema.  Tenderness with left hip flexion and spasm reproduced with extension.  Also pain with internal and external  rotation of the hip.  Point tenderness also with palpation of the lower left paralumbar muscles.  Pain is also reproduced with axial loading the left lower extremity.  Skin:    General: Skin is warm and dry.     Findings: No rash.  Neurological:     Mental Status: She is alert and oriented to person, place, and time.     Cranial Nerves: No cranial nerve deficit.  Psychiatric:        Behavior: Behavior normal.     ED Results / Procedures / Treatments   Labs (all labs ordered are listed, but only abnormal results are displayed) Labs Reviewed - No data to display  EKG None  Radiology DG Lumbar Spine Complete  Result Date: 01/29/2022 CLINICAL DATA:  Pain. EXAM: LUMBAR SPINE - COMPLETE 4+ VIEW COMPARISON:  None Available. FINDINGS: There are 5 non-rib-bearing lumbar-type vertebral bodies. Normal frontal alignment. No sagittal spondylolisthesis. Vertebral body heights are maintained. Possible minimal posterior L5-S1 disc space narrowing. Mild anterior and posterior L4-5 and L5-S1 endplate spurring. No pars defect is seen. Right upper quadrant cholecystectomy clips. An IUD overlies the midline pelvis. IMPRESSION: Very mild L5-S1 degenerative disc and endplate changes. Electronically Signed   By: Neita Garnet M.D.   On: 01/29/2022 09:42   DG Hip Unilat W or Wo Pelvis 2-3 Views Left  Result Date: 01/29/2022 CLINICAL DATA:  Left hip pain.  No known injury.  Started yesterday. EXAM: DG HIP (WITH OR WITHOUT PELVIS) 2-3V LEFT COMPARISON:  None Available. FINDINGS: The bilateral sacroiliac, bilateral femoroacetabular, and pubic symphysis joint spaces are maintained. Minimal superior pubic symphysis degenerative spurring. Small well corticated ossicle superior to the right greater trochanter, likely chronic enthesopathic change at the abductor tendon insertions. No acute fracture is seen. No dislocation. An IUD overlies the midline pelvis. IMPRESSION: 1. No significant osteoarthritis. 2. No acute  fracture. Electronically Signed   By: Neita Garnet M.D.   On: 01/29/2022 09:39    Procedures Procedures    Medications Ordered in ED Medications  ketorolac (TORADOL) injection 60 mg (60 mg Intramuscular Given 01/29/22 0915)  cyclobenzaprine (FLEXERIL) tablet 10 mg (10 mg Oral Given 01/29/22 6160)    ED Course/ Medical Decision Making/ A&P                           Medical Decision Making Amount and/or Complexity of Data Reviewed Radiology: ordered and independent interpretation performed. Decision-making details documented in ED Course.  Risk Prescription drug management.   Pt with multiple medical problems and comorbidities and presenting today  with a complaint that caries a high risk for morbidity and mortality. Pt with sudden onset of back pain suggestive of MSK pain.  No radicular sx.  No neurovascular compromise and no incontinence.  Pt has no infectious sx, hx of CA  or other red flags concerning for pathologic back pain.  Pt is able to ambulate but is painful.  Normal strength and reflexes on exam.  Denies trauma.  Due to significant hip pain and pain with weight bearing plain films pending and pt given pain control.  VS wnl.  Low suspicion at this time for dissection, kidney stone, UTI or acute abdominal pathology.  10:34 AM I have independently visualized and interpreted pt's images today.  Images today of the lumbar spine and hip do not show any evidence of fracture.  Radiology reports some arthritic changes.  Findings discussed with the patient and her husband.  Supportive care at this time.  Patient to follow-up with PCP in 1 week if no better as she may benefit from physical therapy.  She was given steroids has NSAIDs and Tylenol she can continue.  Also she has Zanaflex at home.           Final Clinical Impression(s) / ED Diagnoses Final diagnoses:  Lumbar strain, initial encounter    Rx / DC Orders ED Discharge Orders          Ordered    tiZANidine (ZANAFLEX)  4 MG tablet  Every 6 hours PRN        01/29/22 1000    predniSONE (DELTASONE) 20 MG tablet  Daily        01/29/22 1001              Gwyneth Sprout, MD 01/29/22 1035

## 2022-01-29 NOTE — ED Notes (Signed)
Discharge instructions, follow up care, and prescriptions reviewed and explained, pt verbalized understanding.  

## 2022-02-08 ENCOUNTER — Telehealth: Payer: Self-pay | Admitting: Emergency Medicine

## 2022-02-08 DIAGNOSIS — M545 Low back pain, unspecified: Secondary | ICD-10-CM

## 2022-02-08 NOTE — Progress Notes (Signed)
We are sorry that you are not feeling well.  Here is how we plan to help!  Based on what you have shared with me it looks like you mostly have acute back pain.  Acute back pain is defined as musculoskeletal pain that can resolve in 1-3 weeks with conservative treatment.  I see that you were prescribed prednisone in the ER and that you are taking Zanaflex.  I don't necessarily have anything else to offer through an e-visit from a medication standpoint.  You might consider seeing an orthopedic.  Many of our local ortho offices have walk-in clinics, such as [facility].  [facility]: [email or web address] [address], [place], [place] [address]  [phone]    Please keep in mind that muscle relaxer's can cause fatigue and should not be taken while at work or driving.  Back pain is very common.  The pain often gets better over time.  The cause of back pain is usually not dangerous.  Most people can learn to manage their back pain on their own.  Home Care Stay active.  Start with short walks on flat ground if you can.  Try to walk farther each day. Do not sit, drive or stand in one place for more than 30 minutes.  Do not stay in bed. Do not avoid exercise or work.  Activity can help your back heal faster. Be careful when you bend or lift an object.  Bend at your knees, keep the object close to you, and do not twist. Sleep on a firm mattress.  Lie on your side, and bend your knees.  If you lie on your back, put a pillow under your knees. Only take medicines as told by your doctor. Put ice on the injured area. Put ice in a plastic bag Place a towel between your skin and the bag Leave the ice on for 15-20 minutes, 3-4 times a day for the first 2-3 days. 210 After that, you can switch between ice and heat packs. Ask your doctor about back exercises or massage. Avoid feeling anxious or stressed.  Find good ways to deal with stress, such as  exercise.  Get Help Right Way If: Your pain does not go away with rest or medicine. Your pain does not go away in 1 week. You have new problems. You do not feel well. The pain spreads into your legs. You cannot control when you poop (bowel movement) or pee (urinate) You feel sick to your stomach (nauseous) or throw up (vomit) You have belly (abdominal) pain. You feel like you may pass out (faint). If you develop a fever.  Make Sure you: Understand these instructions. Will watch your condition Will get help right away if you are not doing well or get worse.  Your e-visit answers were reviewed by a board certified advanced clinical practitioner to complete your personal care plan.  Depending on the condition, your plan could have included both over the counter or prescription medications.  If there is a problem please reply  once you have received a response from your provider.  Your safety is important to [place].  If you have drug allergies check your prescription carefully.    You can use MyChart to ask questions about today's visit, request a non-urgent call back, or ask for a work or school excuse for 24 hours related to this e-Visit. If it has been greater than 24 hours you will need to follow up with your provider,  or enter a new e-Visit to address those concerns.  You will get an e-mail in the next two days asking about your experience.  I hope that your e-visit has been valuable and will speed your recovery. Thank you for using e-visits.  Approximately 5 minutes was used in reviewing the patient's chart, questionnaire, prescribing medications, and documentation.

## 2022-03-04 NOTE — Assessment & Plan Note (Deleted)
hgba1c acceptable, minimize simple carbs. Increase exercise as tolerated. Consider PURE diet

## 2022-03-04 NOTE — Assessment & Plan Note (Deleted)
On Levothyroxine, continue to monitor 

## 2022-03-04 NOTE — Assessment & Plan Note (Deleted)
Supplement and monitor 

## 2022-03-04 NOTE — Assessment & Plan Note (Deleted)
Tolerating statin, encouraged heart healthy diet, avoid trans fats, minimize simple carbs and saturated fats. Increase exercise as tolerated 

## 2022-03-05 ENCOUNTER — Ambulatory Visit: Payer: Self-pay | Admitting: Family Medicine

## 2022-03-05 DIAGNOSIS — R739 Hyperglycemia, unspecified: Secondary | ICD-10-CM

## 2022-03-05 DIAGNOSIS — E785 Hyperlipidemia, unspecified: Secondary | ICD-10-CM

## 2022-03-05 DIAGNOSIS — E559 Vitamin D deficiency, unspecified: Secondary | ICD-10-CM

## 2022-03-05 DIAGNOSIS — E538 Deficiency of other specified B group vitamins: Secondary | ICD-10-CM

## 2022-03-05 DIAGNOSIS — E039 Hypothyroidism, unspecified: Secondary | ICD-10-CM

## 2022-03-05 NOTE — Progress Notes (Shared)
Subjective:   By signing my name below, I, Vickey Sages, attest that this documentation has been prepared under the direction and in the presence of Bradd Canary, MD 03/05/2022.    Patient ID: Tiffany Morris, female    DOB: 03-15-1966, 56 y.o.   MRN: [id number]  No chief complaint on file.  HPI Patient is in today for an office visit.  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    Hyperglycemia 06/05/2014   Hyperlipidemia 09/05/2011   hypothyroidism 06/02/2010   Obesity    Pain of left breast 08/05/2011   Pedal edema 08/05/2011   Sleep apnea    Thyroid disease    hyper   Unspecified sleep apnea 08/20/2013   Vitamin B 12 deficiency    Past Surgical History:  Procedure Laterality Date   CESAREAN SECTION  05/27/2002   CHOLECYSTECTOMY     laparoscopic   COLONOSCOPY  05/27/2012   EYE SURGERY     RETINAL DETACHMENT REPAIR W/ SCLERAL BUCKLE LE  01/25/2010   Family History  Problem Relation Age of Onset   Heart disease Mother        CHF   Pulmonary disease Mother        IF, PAH   Hyperlipidemia Mother    Diabetes Mother        borderline   Deep vein thrombosis Mother    Depression Mother    Pulmonary Hypertension Mother    Hyperlipidemia Father    Hypertension Father    Hyperthyroidism Father    Obesity Father    Arthritis Father    Asthma Brother    Autism spectrum disorder Son    ADD / ADHD Son    Anxiety disorder Son    Depression Son    Obesity Son    Cancer Maternal Grandmother        lung- smoker   COPD Maternal Grandfather        smoked   Cancer Maternal Grandfather        bladder   Alcohol abuse Maternal Grandfather    Heart disease Paternal Grandmother        CHF, MI   Hypothyroidism Paternal Grandmother     Hypertension Paternal Grandmother    Hypertension Paternal Grandfather    Heart disease Paternal Grandfather        open heart surgery   Hyperlipidemia Paternal Grandfather    Macular degeneration Paternal Grandfather    Varicose Veins Paternal Grandfather    Social History   Socioeconomic History   Marital status: Married    Spouse name: Not on file   Number of children: Not on file   Years of education: Not on file   Highest education level: Not on file  Occupational History   Occupation: NP    Employer: [employer]  Tobacco Use   Smoking status: Never   Smokeless tobacco: Current  Vaping Use   Vaping Use: Never used  Substance and Sexual Activity   Alcohol use: Yes    Comment: Rarely   Drug use: No   Sexual activity: Not Currently    Birth control/protection: I.U.D.  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    Social Determinants of Health   Financial Resource Strain: Not on file  Food Insecurity: Not on file  Transportation Needs: Not on file  Physical Activity: Not on file  Stress: Not on file  Social Connections: Not on file  Intimate Partner Violence: Not on file   Outpatient Medications Prior to Visit  Medication Sig Dispense Refill   amphetamine-dextroamphetamine (ADDERALL XR) 20 MG 24 hr capsule Take 1 capsule (20 mg total) by mouth daily. 30 capsule 0   atorvastatin (LIPITOR) 10 MG tablet Take 1 tablet (10 mg total) by mouth daily. 90 tablet 1   Cholecalciferol 25 MCG (1000 UT) tablet Take 5,000 Units by mouth.     cyanocobalamin 1000 MCG tablet Take by mouth.     levonorgestrel (MIRENA) 20 MCG/24HR IUD 1 each by Intrauterine route once.     levothyroxine (SYNTHROID) 200 MCG tablet Take 1 tablet (200 mcg total) by mouth daily. 90 tablet 1   nystatin cream (MYCOSTATIN) Apply 1 application topically 2 (two) times daily. 30 g 2    omeprazole (PRILOSEC) 40 MG capsule Take 40 mg by mouth daily.     predniSONE (DELTASONE) 20 MG tablet Take 2 tablets (40 mg total) by mouth daily. 10 tablet 0   sertraline (ZOLOFT) 100 MG tablet Take 1 tablet (100 mg total) by mouth daily. 90 tablet 1   tiZANidine (ZANAFLEX) 4 MG tablet Take 1 tablet (4 mg total) by mouth every 6 (six) hours as needed for muscle spasms. 30 tablet 0   No facility-administered medications prior to visit.   Allergies  Allergen Reactions   Codeine Nausea And Vomiting   Bee Venom Other (See Comments)   ROS    Objective:    Physical Exam Constitutional:      General: She is not in acute distress.    Appearance: Normal appearance. She is not ill-appearing.  HENT:     Head: Normocephalic and atraumatic.     Right Ear: External ear normal.     Left Ear: External ear normal.     Mouth/Throat:     Mouth: Mucous membranes are moist.     Pharynx: Oropharynx is clear.  Eyes:     Extraocular Movements: Extraocular movements intact.     Pupils: Pupils are equal, round, and reactive to light.  Cardiovascular:     Rate and Rhythm: Normal rate and regular rhythm.     Pulses: Normal pulses.     Heart sounds: Normal heart sounds. No murmur heard.    No gallop.  Pulmonary:     Effort: Pulmonary effort is normal. No respiratory distress.     Breath sounds: Normal breath sounds. No wheezing or rales.  Abdominal:     General: Bowel sounds are normal.  Skin:    General: Skin is warm and dry.  Neurological:     Mental Status: She is alert and oriented to person, place, and time.  Psychiatric:        Mood and Affect: Mood normal.        Behavior: Behavior normal.        Judgment: Judgment normal.    There were no vitals taken for this visit. Wt Readings from Last 3 Encounters:  10/30/21 270 lb (122.5 kg)  07/10/21 274 lb 2 oz (124.3 kg)  05/09/21 268 lb 3.2 oz (121.7 kg)   Diabetic Foot Exam - Simple   No data filed  Lab Results  Component Value  Date   WBC 8.0 07/24/2021   HGB 13.9 07/24/2021   HCT 42.8 07/24/2021   PLT 239.0 07/24/2021   GLUCOSE 127 (H) 07/24/2021   CHOL 186 07/24/2021   TRIG 155.0 (H) 07/24/2021   HDL 46.30 07/24/2021   LDLDIRECT 168.3 06/02/2014   LDLCALC 109 (H) 07/24/2021   ALT 15 07/24/2021   AST 13 07/24/2021   NA 140 07/24/2021   K 4.0 07/24/2021   CL 106 07/24/2021   CREATININE 0.88 07/24/2021   BUN 11 07/24/2021   CO2 26 07/24/2021   TSH 8.57 (H) 10/30/2021   HGBA1C 6.1 07/24/2021   Lab Results  Component Value Date   TSH 8.57 (H) 10/30/2021   Lab Results  Component Value Date   WBC 8.0 07/24/2021   HGB 13.9 07/24/2021   HCT 42.8 07/24/2021   MCV 88.4 07/24/2021   PLT 239.0 07/24/2021   Lab Results  Component Value Date   NA 140 07/24/2021   K 4.0 07/24/2021   CO2 26 07/24/2021   GLUCOSE 127 (H) 07/24/2021   BUN 11 07/24/2021   CREATININE 0.88 07/24/2021   BILITOT 0.4 07/24/2021   ALKPHOS 70 07/24/2021   AST 13 07/24/2021   ALT 15 07/24/2021   PROT 6.9 07/24/2021   ALBUMIN 3.8 07/24/2021   CALCIUM 9.1 07/24/2021   GFR 74.98 07/24/2021   Lab Results  Component Value Date   CHOL 186 07/24/2021   Lab Results  Component Value Date   HDL 46.30 07/24/2021   Lab Results  Component Value Date   LDLCALC 109 (H) 07/24/2021   Lab Results  Component Value Date   TRIG 155.0 (H) 07/24/2021   Lab Results  Component Value Date   CHOLHDL 4 07/24/2021   Lab Results  Component Value Date   HGBA1C 6.1 07/24/2021      Assessment & Plan:   Problem List Items Addressed This Visit       Endocrine   Hypothyroid     Other   Vitamin B 12 deficiency   Hyperlipidemia   Hyperglycemia   Vitamin D deficiency   No orders of the defined types were placed in this encounter.  I, Vickey Sages, personally preformed the services described in this documentation.  All medical record entries made by the scribe were at my direction and in my presence.  I have reviewed the chart  and discharge instructions (if applicable) and agree that the record reflects my personal performance and is accurate and complete. 03/05/2022  I,Mohammed Iqbal,acting as a scribe for Danise Edge, MD.,have documented all relevant documentation on the behalf of Danise Edge, MD,as directed by  Danise Edge, MD while in the presence of Danise Edge, MD.  Vickey Sages

## 2022-03-14 ENCOUNTER — Other Ambulatory Visit (HOSPITAL_COMMUNITY): Payer: Self-pay

## 2022-05-30 ENCOUNTER — Ambulatory Visit (INDEPENDENT_AMBULATORY_CARE_PROVIDER_SITE_OTHER): Payer: No Typology Code available for payment source

## 2022-05-30 ENCOUNTER — Ambulatory Visit
Admission: EM | Admit: 2022-05-30 | Discharge: 2022-05-30 | Disposition: A | Discharge status: Home / Self Care | Payer: No Typology Code available for payment source | Attending: Emergency Medicine | Admitting: Emergency Medicine

## 2022-05-30 DIAGNOSIS — G43919 Migraine, unspecified, intractable, without status migrainosus: Secondary | ICD-10-CM | POA: Diagnosis not present

## 2022-05-30 DIAGNOSIS — R058 Other specified cough: Secondary | ICD-10-CM

## 2022-05-30 DIAGNOSIS — U099 Post covid-19 condition, unspecified: Secondary | ICD-10-CM | POA: Diagnosis not present

## 2022-05-30 DIAGNOSIS — R0602 Shortness of breath: Secondary | ICD-10-CM | POA: Diagnosis not present

## 2022-05-30 MED ORDER — KETOROLAC TROMETHAMINE 30 MG/ML IJ SOLN
30.0000 mg | Freq: Once | INTRAMUSCULAR | Status: AC
  Administered 2022-05-30: 30 mg via INTRAMUSCULAR

## 2022-05-30 MED ORDER — METHYLPREDNISOLONE 8 MG PO TABS: 32.0000 mg | ORAL_TABLET | Freq: Every day | ORAL | 0 refills | Status: AC

## 2022-05-30 MED ORDER — ALBUTEROL SULFATE HFA 108 (90 BASE) MCG/ACT IN AERS: 2.0000 | INHALATION_SPRAY | Freq: Four times a day (QID) | RESPIRATORY_TRACT | 2 refills | Status: DC | PRN

## 2022-05-30 MED ORDER — SUMATRIPTAN SUCCINATE 6 MG/0.5ML ~~LOC~~ SOLN
6.0000 mg | Freq: Once | SUBCUTANEOUS | Status: AC
  Administered 2022-05-30: 6 mg via SUBCUTANEOUS

## 2022-05-30 NOTE — Discharge Instructions (Addendum)
During your visit today you received an injection of ketorolac and sumatriptan.  I hope this provides you with long-lasting relief of your headache.  Please let us know if it does not.  Your chest x-ray was not concerning for any cardiopulmonary disease.  I do believe that your wheezing does need to be addressed I am happy to provide you with a prescription for Solu-Medrol for the next 3 days as well as an albuterol inhaler that I would like for you to use 4 times daily by inhaling 2 puffs each time.  Please let us know if there is anything else we can do for you.  Thank you for visiting urgent care today.

## 2022-05-30 NOTE — ED Triage Notes (Signed)
Pt c/o headache since yesterday. She reports she had wheezing, and periodically has SOB and cough.   Home interventions: Excedrin migraine

## 2022-05-30 NOTE — ED Provider Notes (Signed)
[facility]    CSN: [id number] Arrival date & time: 05/30/22  1433    HISTORY   Chief Complaint  Patient presents with   Headache   HPI Tiffany Morris is a pleasant, 57 y.o. female who presents to urgent care today. Pt c/o left-sided headache since yesterday, reports history of migraines, states she typically takes Excedrin which gets rid of it however she took an Excedrin yesterday and it did help.  Patient states her headache was more intense this morning when she woke up.  Patient states she had COVID-19 about 2 months ago.  Patient states she does not really feel like the cough is ever resolved.  Patient states sometimes if she is sitting quietly, she can hear herself wheezing and states her husband can hear it as well.  Patient states sometimes she gets short of breath just doing simple movements such as rolling over in bed.  Patient states she also has had a severe cough that she feels like should be productive but after intense effort with coughing sometimes is only able to produce a small amount of sputum.  Patient has normal vital signs on arrival today.  The history is provided by the patient.   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    Hyperglycemia 06/05/2014   Hyperlipidemia 09/05/2011   hypothyroidism 06/02/2010   Obesity    Pain of left breast 08/05/2011   Pedal edema 08/05/2011   Sleep apnea    Thyroid disease    hyper   Unspecified sleep apnea 08/20/2013   Vitamin B 12 deficiency    Patient Active Problem List   Diagnosis Date Noted   Dyspnea 01/30/2021   Right elbow pain 04/10/2020   Vitamin D deficiency 05/11/2018   Hyperglycemia 06/05/2014   OSA on CPAP 09/22/2013   Hyperlipidemia 09/05/2011    Depression with anxiety 08/05/2011   Preventative health care 08/05/2011   Pedal edema 08/05/2011   Hypothyroid    Vitamin B 12 deficiency    Obesity    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 Authorizing Provider  amphetamine-dextroamphetamine (ADDERALL XR) 20 MG 24 hr capsule Take 1 capsule (20 mg total) by mouth daily. 08/15/21   Mozingo, Berdie Ogren, NP  atorvastatin (LIPITOR) 10 MG tablet Take 1 tablet (10 mg total) by mouth daily. 04/25/21   Mosie Lukes, MD  Cholecalciferol 25 MCG (1000 UT) tablet Take 5,000 Units by  mouth.    [provider]  cyanocobalamin 1000 MCG tablet Take by mouth.    [provider]  levonorgestrel (MIRENA) 20 MCG/24HR IUD 1 each by Intrauterine route once.    [provider]  levothyroxine (SYNTHROID) 200 MCG tablet Take 1 tablet (200 mcg total) by mouth daily. 04/25/21   Mosie Lukes, MD  nystatin cream (MYCOSTATIN) Apply 1 application topically 2 (two) times daily. 04/24/21   Mosie Lukes, MD  omeprazole (PRILOSEC) 40 MG capsule Take 40 mg by mouth daily. 03/18/21   [provider]  predniSONE (DELTASONE) 20 MG tablet Take 2 tablets (40 mg total) by mouth daily. 01/29/22   Blanchie Dessert, MD  sertraline (ZOLOFT) 100 MG tablet Take 1 tablet (100 mg total) by mouth daily. 08/15/21   Mozingo, Berdie Ogren, NP  tiZANidine (ZANAFLEX) 4 MG tablet Take 1 tablet (4 mg total) by mouth every 6 (six) hours as needed for muscle  spasms. 01/29/22   Blanchie Dessert, MD    Family History Family History  Problem Relation Age of Onset   Heart disease Mother        CHF   Pulmonary disease Mother        IF, PAH   Hyperlipidemia Mother    Diabetes Mother        borderline   Deep vein thrombosis Mother    Depression Mother    Pulmonary Hypertension Mother    Hyperlipidemia Father    Hypertension Father    Hyperthyroidism Father    Obesity Father    Arthritis Father    Asthma Brother    Autism spectrum disorder Son    ADD / ADHD Son    Anxiety disorder Son    Depression Son    Obesity Son    Cancer Maternal Grandmother        lung- smoker   COPD Maternal Grandfather        smoked   Cancer Maternal Grandfather        bladder   Alcohol abuse Maternal Grandfather    Heart disease Paternal Grandmother        CHF, MI   Hypothyroidism Paternal Grandmother    Hypertension Paternal Grandmother    Hypertension Paternal Grandfather    Heart disease Paternal Grandfather        open heart surgery   Hyperlipidemia Paternal Grandfather    Macular degeneration Paternal Grandfather    Varicose Veins Paternal Grandfather    Social History Social History   Tobacco Use   Smoking status: Never   Smokeless tobacco: Current  Vaping Use   Vaping Use: Never used  Substance Use Topics   Alcohol use: Yes    Comment: Rarely   Drug use: No   Allergies   Codeine and Bee venom  Review of Systems Review of Systems Pertinent findings revealed after performing a 14 point review of systems has been noted in the history of present illness.  Physical Exam Triage Vital Signs ED Triage Vitals  Enc Vitals Group     BP 03/23/21 0827 (!) 147/82     Pulse Rate 03/23/21 0827 72     Resp 03/23/21 0827 18     Temp 03/23/21 0827 98.3 F (36.8 C)     Temp Source 03/23/21 0827 Oral     SpO2 03/23/21 0827 98 %     Weight --      Height --      Head Circumference --  Peak Flow --      Pain Score 03/23/21 0826 5      Pain Loc --      Pain Edu? --      Excl. in [place]? --   No data found.  Updated Vital Signs BP 128/87 (BP Location: Right Arm)   Pulse 91   Temp 97.9 F (36.6 C) (Oral)   Resp 18   SpO2 98%   Physical Exam Vitals and nursing note reviewed.  Constitutional:      General: She is not in acute distress.    Appearance: Normal appearance. She is not ill-appearing.  HENT:     Head: Normocephalic and atraumatic.     Salivary Glands: Right salivary gland is not diffusely enlarged or tender. Left salivary gland is not diffusely enlarged or tender.     Right Ear: Tympanic membrane, ear canal and external ear normal. No drainage. No middle ear effusion. There is no impacted cerumen. Tympanic membrane is not erythematous or bulging.     Left Ear: Tympanic membrane, ear canal and external ear normal. No drainage.  No middle ear effusion. There is no impacted cerumen. Tympanic membrane is not erythematous or bulging.     Nose: Nose normal. No nasal deformity, septal deviation, mucosal edema, congestion or rhinorrhea.     Right Turbinates: Not enlarged, swollen or pale.     Left Turbinates: Not enlarged, swollen or pale.     Right Sinus: No maxillary sinus tenderness or frontal sinus tenderness.     Left Sinus: No maxillary sinus tenderness or frontal sinus tenderness.     Mouth/Throat:     Lips: Pink. No lesions.     Mouth: Mucous membranes are moist. No oral lesions.     Pharynx: Oropharynx is clear. Uvula midline. No posterior oropharyngeal erythema or uvula swelling.     Tonsils: No tonsillar exudate. 0 on the right. 0 on the left.  Eyes:     General: Lids are normal.        Right eye: No discharge.        Left eye: No discharge.     Extraocular Movements: Extraocular movements intact.     Conjunctiva/sclera: Conjunctivae normal.     Right eye: Right conjunctiva is not injected.     Left eye: Left conjunctiva is not injected.  Neck:     Trachea: Trachea and phonation normal.   Cardiovascular:     Rate and Rhythm: Normal rate and regular rhythm.     Pulses: Normal pulses.     Heart sounds: Normal heart sounds, S1 normal and S2 normal. No murmur heard.    No friction rub. No gallop.  Pulmonary:     Effort: Pulmonary effort is normal. No tachypnea, bradypnea, accessory muscle usage, prolonged expiration, respiratory distress or retractions.     Breath sounds: No stridor, decreased air movement or transmitted upper airway sounds. Examination of the left-middle field reveals wheezing. Examination of the left-lower field reveals wheezing. Wheezing present. No decreased breath sounds, rhonchi or rales.  Chest:     Chest wall: No tenderness.  Musculoskeletal:        General: Normal range of motion.     Cervical back: Normal range of motion and neck supple. Normal range of motion.  Lymphadenopathy:     Cervical: No cervical adenopathy.  Skin:    General: Skin is warm and dry.     Findings: No erythema or rash.  Neurological:     General: No focal  deficit present.     Mental Status: She is alert and oriented to person, place, and time. Mental status is at baseline.     Cranial Nerves: Cranial nerves 2-12 are intact.     Sensory: Sensation is intact.     Motor: Motor function is intact.     Gait: Gait is intact.     Deep Tendon Reflexes: Reflexes are normal and symmetric.  Psychiatric:        Mood and Affect: Mood normal.        Behavior: Behavior normal.     Visual Acuity Right Eye Distance:   Left Eye Distance:   Bilateral Distance:    Right Eye Near:   Left Eye Near:    Bilateral Near:     [facility] Couse / Diagnostics / Procedures:     Radiology DG Chest 2 View  Result Date: 05/30/2022 CLINICAL DATA:  Abnormal left lower lobe breath sounds. Shortness of breath. EXAM: CHEST - 2 VIEW COMPARISON:  Chest two views 07/26/2019 FINDINGS: Cardiac silhouette and mediastinal contours are within normal limits. The lungs are clear. No pleural effusion or  pneumothorax. There is again mild dextrocurvature of the midthoracic spine. Mild multilevel degenerative disc changes. IMPRESSION: No active cardiopulmonary disease. Electronically Signed   By: Yvonne Kendall M.D.   On: 05/30/2022 17:05    Procedures Procedures (including critical care time) EKG  Pending results:  Labs Reviewed - No data to display  Medications Ordered in UC: Medications  ketorolac (TORADOL) 30 MG/ML injection 30 mg (30 mg Intramuscular Given 05/30/22 1645)  SUMAtriptan (IMITREX) injection 6 mg (6 mg Subcutaneous Given 05/30/22 1645)    UC Diagnoses / Final Clinical Impressions(s)   I have reviewed the triage vital signs and the nursing notes.  Pertinent labs & imaging results that were available during my care of the patient were reviewed by me and considered in my medical decision making (see chart for details).    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  Recommend albuterol and methylprednisolone to resolve wheezing and left lower lung fields.  Patient provided with an injection of sumatriptan ketorolac during her visit today to resolve headache.  Return precautions advised. Please see discharge instructions below for further details of plan of care as provided to patient. ED Prescriptions     Medication Sig Dispense Auth. Provider   methylPREDNISolone (MEDROL) 8 MG tablet Take 4 tablets (32 mg total) by mouth daily for 3 days. 12 tablet Lynden Oxford Scales, PA-C   albuterol (VENTOLIN HFA) 108 (90 Base) MCG/ACT inhaler Inhale 2 puffs into the lungs every 6 (six) hours as needed for wheezing or shortness of breath (Cough). 36 g Lynden Oxford Scales, PA-C      PDMP not reviewed this encounter.  Disposition Upon Discharge:  Condition: stable for discharge home Home: take medications as prescribed;  routine discharge instructions as discussed; follow up as advised.  Patient presented with an acute illness with associated systemic symptoms and significant discomfort requiring urgent management. In my opinion, this is a condition that a prudent lay person (someone who possesses an average knowledge of health and medicine) may potentially expect to result in complications if not addressed urgently such as respiratory distress, impairment of bodily function or dysfunction of bodily organs.   Routine symptom specific, illness specific and/or disease specific instructions were discussed with the patient and/or  caregiver at length.   As such, the patient has been evaluated and assessed, work-up was performed and treatment was provided in alignment with urgent care protocols and evidence based medicine.  Patient/parent/caregiver has been advised that the patient may require follow up for further testing and treatment if the symptoms continue in spite of treatment, as clinically indicated and appropriate.  If the patient was tested for COVID-19, Influenza and/or RSV, then the patient/parent/guardian was advised to isolate at home pending the results of his/her diagnostic coronavirus test and potentially longer if they're positive. I have also advised pt that if his/her COVID-19 test returns positive, it's recommended to self-isolate for at least 10 days after symptoms first appeared AND until fever-free for 24 hours without fever reducer AND other symptoms have improved or resolved. Discussed self-isolation recommendations as well as instructions for household member/close contacts as per the [facility] and  DH[facility], and also gave patient the [place] packet with this information.  Patient/parent/caregiver has been advised to return to the [facility] or PCP in 3-5 days if no better; to PCP or the Emergency Department if new signs and symptoms develop, or if the current signs or symptoms continue to change or worsen for further  workup, evaluation and treatment as clinically indicated and appropriate  The patient will follow up with their current PCP if and as advised. If the patient does not currently have a PCP we will assist them in obtaining one.   The patient may need specialty follow up if the symptoms continue, in spite of conservative treatment and management, for further workup, evaluation, consultation and treatment as clinically indicated and appropriate.  Patient/parent/caregiver verbalized understanding and agreement of plan as discussed.  All questions were addressed during visit.  Please see discharge instructions below for further details of plan.  Discharge Instructions:   Discharge Instructions      During your visit today you received an injection of ketorolac and sumatriptan.  I hope this provides you with long-lasting relief of your headache.  Please let us know if it does not.  Your chest x-ray was not concerning for any cardiopulmonary disease.  I do believe that your wheezing does need to be addressed I am happy to provide you with a prescription for Solu-Medrol for the next 3 days as well as an albuterol inhaler that I would like for you to use 4 times daily by inhaling 2 puffs each time.  Please let us know if there is anything else we can do for you.  Thank you for visiting urgent care today.        This office note has been dictated using Museum/gallery curator.  Unfortunately, this method of dictation can sometimes lead to typographical or grammatical errors.  I apologize for your inconvenience in advance if this occurs.  Please do not hesitate to reach out to me if clarification is needed.      Lynden Oxford Scales, PA-C 05/30/22 1719

## 2022-07-09 ENCOUNTER — Ambulatory Visit (HOSPITAL_BASED_OUTPATIENT_CLINIC_OR_DEPARTMENT_OTHER): Payer: No Typology Code available for payment source | Admitting: Pulmonary Disease

## 2022-07-09 ENCOUNTER — Encounter (HOSPITAL_BASED_OUTPATIENT_CLINIC_OR_DEPARTMENT_OTHER): Payer: Self-pay | Admitting: Pulmonary Disease

## 2022-07-09 VITALS — BP 128/80 | HR 77 | Temp 98.4°F | Ht 65.0 in | Wt 284.7 lb

## 2022-07-09 DIAGNOSIS — G4733 Obstructive sleep apnea (adult) (pediatric): Secondary | ICD-10-CM | POA: Diagnosis not present

## 2022-07-09 NOTE — Progress Notes (Signed)
   Subjective:    Patient ID: Tiffany Morris, female    DOB: [birth year], 57 y.o.   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   She works for [employer] and does home visits OSA was diagnosed in 2015, she was placed on CPAP of 8 cm  Due to weight gain, we changed her to AutoSet 8 to 12 cm in 2022   Chief Complaint  Patient presents with   Follow-up    Pt states she had covid 1 week prior to Thanksgiving 2023 and since has had some intermittent wheezing and coughing. States cpap is going well.   4-month follow-up. CPAP is working well.  She denies any problems with mask or pressure.  She wakes up feeling rested and denies daytime naps. Her mother passed away last year. She developed COVID around Thanksgiving mostly had a cough and body ache but cough persisted and finally resolved.  Wheezing has persisted and she reports occasional wheezing regardless of exertion or body position. She went to urgent care and was given albuterol MDI, chest x-ray was negative. She wonders if she has developed allergy to dogs, she reports a scratchy sensation in her throat when she is around dogs  Significant tests/ events reviewed  .NPSG 07/2013:  AHI 24/hr, cpap to 8 cm.    Review of Systems neg for any significant sore throat, dysphagia, itching, sneezing, nasal congestion or excess/ purulent secretions, fever, chills, sweats, unintended wt loss, pleuritic or exertional cp, hempoptysis, orthopnea pnd or change in chronic leg swelling. Also denies presyncope, palpitations, heartburn, abdominal pain, nausea, vomiting, diarrhea or change in bowel or urinary habits, dysuria,hematuria, rash, arthralgias, visual complaints, headache, numbness weakness or ataxia.     Objective:   Physical Exam  Gen. Pleasant, obese, in no distress, normal affect ENT - no pallor,icterus, no post nasal drip, class 2-3 airway Neck: No JVD, no thyromegaly, no carotid bruits Lungs: no  use of accessory muscles, no dullness to percussion, decreased without rales or rhonchi  Cardiovascular: Rhythm regular, heart sounds  normal, no murmurs or gallops, no peripheral edema Abdomen: soft and non-tender, no hepatosplenomegaly, BS normal. Musculoskeletal: No deformities, no cyanosis or clubbing Neuro:  alert, non focal, no tremors       Assessment & Plan:   Wheezing -unclear cause, may be postviral bronchospasm after COVID infection in late November.  GERD seems to be controlled on omeprazole and she denies postnasal drip. She will continue to use albuterol on an as needed basis.  If wheezing persists, then can consider spirometry but doubt late onset asthma

## 2022-07-09 NOTE — Patient Instructions (Signed)
  CPAP is working well under current auto settings 8 to 12 cm Congratulations on being a good CPAP user!Faythe Ghee to use albuterol on an as-needed basis for wheezing. Please call me back if this persists or gets worse

## 2022-07-09 NOTE — Assessment & Plan Note (Signed)
CPAP download was reviewed which shows excellent control of events on auto settings 8 to 12 cm with average pressure of 12 cm.  She is very compliant and has minimal leak.  CPAP is only helped improve her daytime somnolence and fatigue  Weight loss encouraged, compliance with goal of at least 4-6 hrs every night is the expectation. Advised against medications with sedative side effects Cautioned against driving when sleepy - understanding that sleepiness will vary on a day to day basis

## 2022-07-25 ENCOUNTER — Encounter: Payer: Self-pay | Admitting: Family Medicine

## 2022-07-25 DIAGNOSIS — F331 Major depressive disorder, recurrent, moderate: Secondary | ICD-10-CM

## 2022-07-25 DIAGNOSIS — F411 Generalized anxiety disorder: Secondary | ICD-10-CM

## 2022-07-26 MED ORDER — OMEPRAZOLE 40 MG PO CPDR: 40.0000 mg | DELAYED_RELEASE_CAPSULE | Freq: Every day | ORAL | 1 refills | Status: DC

## 2022-07-26 MED ORDER — LEVOTHYROXINE SODIUM 200 MCG PO TABS: 200.0000 ug | ORAL_TABLET | Freq: Every day | ORAL | 1 refills | Status: DC

## 2022-07-26 MED ORDER — SERTRALINE HCL 100 MG PO TABS: 100.0000 mg | ORAL_TABLET | Freq: Every day | ORAL | 1 refills | Status: DC

## 2022-07-26 MED ORDER — ATORVASTATIN CALCIUM 10 MG PO TABS: 10.0000 mg | ORAL_TABLET | Freq: Every day | ORAL | 1 refills | Status: DC

## 2022-10-17 ENCOUNTER — Encounter: Payer: Self-pay | Admitting: Family Medicine

## 2022-10-27 NOTE — Assessment & Plan Note (Signed)
Supplement and monitor 

## 2022-10-27 NOTE — Assessment & Plan Note (Signed)
Encourage heart healthy diet such as MIND or DASH diet, increase exercise, avoid trans fats, simple carbohydrates and processed foods, consider a krill or fish or flaxseed oil cap daily.  °

## 2022-10-27 NOTE — Assessment & Plan Note (Signed)
hgba1c acceptable, minimize simple carbs. Increase exercise as tolerated.  

## 2022-10-27 NOTE — Assessment & Plan Note (Signed)
On Levothyroxine, continue to monitor

## 2022-10-27 NOTE — Assessment & Plan Note (Signed)
Using CPAP and following with pulmnology

## 2022-10-28 ENCOUNTER — Encounter: Payer: Self-pay | Admitting: Family Medicine

## 2022-10-28 ENCOUNTER — Telehealth: Payer: No Typology Code available for payment source | Admitting: Family Medicine

## 2022-10-28 DIAGNOSIS — E785 Hyperlipidemia, unspecified: Secondary | ICD-10-CM | POA: Diagnosis not present

## 2022-10-28 DIAGNOSIS — E538 Deficiency of other specified B group vitamins: Secondary | ICD-10-CM | POA: Diagnosis not present

## 2022-10-28 DIAGNOSIS — E559 Vitamin D deficiency, unspecified: Secondary | ICD-10-CM

## 2022-10-28 DIAGNOSIS — R739 Hyperglycemia, unspecified: Secondary | ICD-10-CM

## 2022-10-28 DIAGNOSIS — F418 Other specified anxiety disorders: Secondary | ICD-10-CM

## 2022-10-28 DIAGNOSIS — G4733 Obstructive sleep apnea (adult) (pediatric): Secondary | ICD-10-CM

## 2022-10-28 DIAGNOSIS — E039 Hypothyroidism, unspecified: Secondary | ICD-10-CM

## 2022-10-28 NOTE — Progress Notes (Signed)
MyChart Video Visit    Virtual Visit via Video Note   This visit type was conducted due to national recommendations for restrictions regarding the COVID-19 Pandemic (e.g. social distancing) in an effort to limit this patient's exposure and mitigate transmission in our community. This patient is at least at moderate risk for complications without adequate follow up. This format is felt to be most appropriate for this patient at this time. Physical exam was limited by quality of the video and audio technology used for the visit. Shamaine, CMA was able to get the patient set up on a video visit.  Patient location: home Patient and provider in visit Provider location: Office  I discussed the limitations of evaluation and management by telemedicine and the availability of in person appointments. The patient expressed understanding and agreed to proceed.  Visit Date: 10/28/2022  Today's healthcare provider: Danise Edge, MD     Subjective:    Patient ID: Tiffany Morris, female    DOB: 12-25-1965, 57 y.o.   MRN: [id number]  No chief complaint on file.   HPI Patient is in today for follow up on chronic medical concerns. No recent febrile illness or hospitalizations. Denies CP/palp/SOB/HA/congestion/fevers/GI or GU c/o. Taking meds as prescribed. She is struggling as the anniversary of her mother's death has just past. 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    Hyperglycemia 06/05/2014   Hyperlipidemia 09/05/2011   hypothyroidism 06/02/2010   Obesity    Pain of left breast  08/05/2011   Pedal edema 08/05/2011   Sleep apnea    Thyroid disease    hyper   Unspecified sleep apnea 08/20/2013   Vitamin B 12 deficiency     Past Surgical History:  Procedure Laterality Date   CESAREAN SECTION  05/27/2002   CHOLECYSTECTOMY     laparoscopic   COLONOSCOPY  05/27/2012   EYE SURGERY     RETINAL DETACHMENT REPAIR W/ SCLERAL BUCKLE LE  01/25/2010    Family History  Problem Relation Age of Onset   Heart disease Mother        CHF   Pulmonary disease Mother        IF, PAH   Hyperlipidemia Mother    Diabetes Mother        borderline   Deep vein thrombosis Mother    Depression Mother    Pulmonary Hypertension Mother    Hyperlipidemia Father    Hypertension Father    Hyperthyroidism Father    Obesity Father    Arthritis Father    Asthma Brother    Autism spectrum disorder Son    ADD / ADHD Son    Anxiety disorder Son    Depression Son    Obesity Son    Cancer Maternal Grandmother        lung- smoker   COPD Maternal Grandfather        smoked   Cancer Maternal Grandfather        bladder   Alcohol abuse Maternal Grandfather    Heart disease Paternal Grandmother  CHF, MI   Hypothyroidism Paternal Grandmother    Hypertension Paternal Grandmother    Hypertension Paternal Grandfather    Heart disease Paternal Grandfather        open heart surgery   Hyperlipidemia Paternal Grandfather    Macular degeneration Paternal Grandfather    Varicose Veins Paternal Grandfather     Social History   Socioeconomic History   Marital status: Married    Spouse name: Not on file   Number of children: Not on file   Years of education: Not on file   Highest education level: Not on file  Occupational History   Occupation: NP    Employer: [employer]  Tobacco Use   Smoking status: Never   Smokeless tobacco: Current  Vaping Use   Vaping Use: Never used  Substance and Sexual Activity   Alcohol use: Yes    Comment: Rarely   Drug use: No   Sexual  activity: Not Currently    Birth control/protection: I.U.D.  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    Social Determinants of Health   Financial Resource Strain: Not on file  Food Insecurity: Not on file  Transportation Needs: Not on file  Physical Activity: Not on file  Stress: Not on file  Social Connections: Not on file  Intimate Partner Violence: Not on file    Outpatient Medications Prior to Visit  Medication Sig Dispense Refill   albuterol (VENTOLIN HFA) 108 (90 Base) MCG/ACT inhaler Inhale 2 puffs into the lungs every 6 (six) hours as needed for wheezing or shortness of breath (Cough). 36 g 2   amphetamine-dextroamphetamine (ADDERALL XR) 20 MG 24 hr capsule Take 1 capsule (20 mg total) by mouth daily. (Patient not taking: Reported on 07/09/2022) 30 capsule 0   atorvastatin (LIPITOR) 10 MG tablet Take 1 tablet (10 mg total) by mouth daily. 90 tablet 1   Cholecalciferol 25 MCG (1000 UT) tablet Take 5,000 Units by mouth.     cyanocobalamin 1000 MCG tablet Take by mouth daily.     levonorgestrel (MIRENA) 20 MCG/24HR IUD 1 each by Intrauterine route once.     levothyroxine (SYNTHROID) 200 MCG tablet Take 1 tablet (200 mcg total) by mouth daily. 90 tablet 1   nystatin cream (MYCOSTATIN) Apply 1 application topically 2 (two) times daily. (Patient taking differently: Apply 1 application  topically as needed for dry skin.) 30 g 2   omeprazole (PRILOSEC) 40 MG capsule Take 1 capsule (40 mg total) by mouth daily. 90 capsule 1   sertraline (ZOLOFT) 100 MG tablet Take 1 tablet (100 mg total) by mouth daily. 90 tablet 1   No facility-administered medications prior to visit.    Allergies  Allergen Reactions   Codeine Nausea And Vomiting   Bee Venom Other (See Comments)    Review of Systems  Constitutional:  Positive for malaise/fatigue. Negative for fever.  HENT:  Negative  for congestion.   Eyes:  Negative for blurred vision.  Respiratory:  Negative for shortness of breath.   Cardiovascular:  Negative for chest pain, palpitations and leg swelling.  Gastrointestinal:  Negative for abdominal pain, blood in stool and nausea.  Genitourinary:  Negative for dysuria and frequency.  Musculoskeletal:  Negative for falls.  Skin:  Negative for rash.  Neurological:  Negative for dizziness, loss of consciousness and headaches.  Endo/Heme/Allergies:  Negative for  environmental allergies.  Psychiatric/Behavioral:  Positive for depression. Negative for hallucinations, substance abuse and suicidal ideas. The patient is nervous/anxious.        Objective:    Physical Exam Constitutional:      General: She is not in acute distress.    Appearance: Normal appearance. She is not ill-appearing or toxic-appearing.  HENT:     Head: Normocephalic and atraumatic.     Right Ear: External ear normal.     Left Ear: External ear normal.     Nose: Nose normal.  Eyes:     General:        Right eye: No discharge.        Left eye: No discharge.  Pulmonary:     Effort: Pulmonary effort is normal.  Skin:    Findings: No rash.  Neurological:     Mental Status: She is alert and oriented to person, place, and time.  Psychiatric:        Behavior: Behavior normal.     There were no vitals taken for this visit. Wt Readings from Last 3 Encounters:  07/09/22 284 lb 11.6 oz (129.2 kg)  10/30/21 270 lb (122.5 kg)  07/10/21 274 lb 2 oz (124.3 kg)       Assessment & Plan:  Hyperglycemia Assessment & Plan: hgba1c acceptable, minimize simple carbs. Increase exercise as tolerated.    Hyperlipidemia, unspecified hyperlipidemia type Assessment & Plan: Encourage heart healthy diet such as MIND or DASH diet, increase exercise, avoid trans fats, simple carbohydrates and processed foods, consider a krill or fish or flaxseed oil cap daily.    Hypothyroidism, unspecified  type Assessment & Plan: On Levothyroxine, continue to monitor   Vitamin B 12 deficiency Assessment & Plan: Supplement and monitor   Vitamin D deficiency Assessment & Plan: Supplement and monitor   OSA on CPAP Assessment & Plan: Using CPAP and following with pulmnology   Depression with anxiety Assessment & Plan: Her depression has worsened she has started to skip doses of all of her meds and maybe only had 1/2 of her doses of Sertraline last month. She agrees to start counseling back and take her sertraline daily but if no improvement will need referral to psychiatry for evaluation. She has just begun to clean out her mother's home who died on 10/25/2021 and the patient notes May was very hard for her. She will get her FMLA paperwork from work and she will stay out of work til 12/02/2022. Spent 25 minutes discussing current state and plan of care.       I discussed the assessment and treatment plan with the patient. The patient was provided an opportunity to ask questions and all were answered. The patient agreed with the plan and demonstrated an understanding of the instructions.   The patient was advised to call back or seek an in-person evaluation if the symptoms worsen or if the condition fails to improve as anticipated.  Danise Edge, MD [facility] at [facility] [phone] (phone) [phone] (fax)  [facility]

## 2022-10-28 NOTE — Assessment & Plan Note (Signed)
Her depression has worsened she has started to skip doses of all of her meds and maybe only had 1/2 of her doses of Sertraline last month. She agrees to start counseling back and take her sertraline daily but if no improvement will need referral to psychiatry for evaluation. She has just begun to clean out her mother's home who died on Oct 22, 2021 and the patient notes May was very hard for her. She will get her FMLA paperwork from work and she will stay out of work til 12/02/2022. Spent 25 minutes discussing current state and plan of care.

## 2022-10-29 ENCOUNTER — Telehealth: Payer: Self-pay | Admitting: Family Medicine

## 2022-10-29 NOTE — Telephone Encounter (Signed)
Pt dropped off document to be filled out by provider (FMLA 4 pages-) Pt would like document to be faxed when document ready to Fax [phone]. Pt had last appt with provider 10-28-2022. Document put at front office tray under providers name.

## 2022-10-29 NOTE — Telephone Encounter (Signed)
Have the forms and we have 5-7 business days to complete.

## 2022-11-08 ENCOUNTER — Telehealth: Payer: Self-pay

## 2022-11-08 NOTE — Telephone Encounter (Signed)
Called pt and left a VM informing pt PCP has a few questions to ask about form and to call the office back when she gets a chance.

## 2022-11-20 NOTE — Assessment & Plan Note (Signed)
Supplement and monitor 

## 2022-11-20 NOTE — Assessment & Plan Note (Signed)
On Levothyroxine, continue to monitor 

## 2022-11-20 NOTE — Assessment & Plan Note (Signed)
Good recent weight loss

## 2022-11-20 NOTE — Assessment & Plan Note (Signed)
Encourage heart healthy diet such as MIND or DASH diet, increase exercise, avoid trans fats, simple carbohydrates and processed foods, consider a krill or fish or flaxseed oil cap daily.  °

## 2022-11-20 NOTE — Assessment & Plan Note (Signed)
hgba1c acceptable, minimize simple carbs. Increase exercise as tolerated.  

## 2022-11-20 NOTE — Progress Notes (Signed)
Subjective:    Patient ID: Tiffany Morris, female    DOB: Mar 13, 1966, 57 y.o.   MRN: [id number]  Chief Complaint  Patient presents with   Annual Exam    Annual Exam    HPI Discussed the use of AI scribe software for clinical note transcription with the patient, who gave verbal consent to proceed.  History of Present Illness   The patient, with a history of sleep apnea, thyroid issues, cholesterol, and borderline sugar levels, presents with worsening shortness of breath. They attribute this to their weight and believe it is getting progressively worse. They have been using a CPAP machine for their sleep apnea and have had no issues with it. They have a family history of early coronary artery disease, with three first cousins having heart attacks in their 51s and 53s. Two of these cousins were smokers. The patient had a colonoscopy in 2023 and a mammogram in 2022, with the next mammogram due soon.    Patient is a 57 yo female in today for follow up on chronic medical concerns. No recent febrile illness or hospitalizations. Denies CP/palp/SOB/HA/congestion/fevers/GI or GU c/o. Taking meds as prescribed     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    Hyperglycemia 06/05/2014   Hyperlipidemia 09/05/2011   hypothyroidism 06/02/2010   Obesity    Pain of left breast 08/05/2011   Pedal edema 08/05/2011   Sleep apnea    Thyroid disease    hyper   Unspecified sleep apnea 08/20/2013   Vitamin B 12 deficiency     Past Surgical History:  Procedure Laterality Date   CESAREAN SECTION  05/27/2002   CHOLECYSTECTOMY     laparoscopic   COLONOSCOPY  05/27/2012   EYE SURGERY     RETINAL DETACHMENT REPAIR W/ SCLERAL BUCKLE LE  01/25/2010    Family  History  Problem Relation Age of Onset   Heart disease Mother        CHF   Pulmonary disease Mother        IF, PAH   Hyperlipidemia Mother    Diabetes Mother        borderline   Deep vein thrombosis Mother    Depression Mother    Pulmonary Hypertension Mother    Hyperlipidemia Father    Hypertension Father    Hyperthyroidism Father    Obesity Father    Arthritis Father    Asthma Brother    Autism spectrum disorder Son    ADD / ADHD Son    Anxiety disorder Son    Depression Son    Obesity Son    Cancer Maternal Grandmother        lung- smoker   COPD Maternal Grandfather        smoked   Cancer Maternal Grandfather        bladder   Alcohol abuse Maternal Grandfather    Heart disease Paternal Grandmother        CHF, MI   Hypothyroidism Paternal Grandmother    Hypertension Paternal Grandmother    Hypertension Paternal Grandfather    Heart disease Paternal Grandfather        open heart surgery   Hyperlipidemia Paternal Grandfather    Macular degeneration Paternal Grandfather  Varicose Veins Paternal Grandfather     Social History   Socioeconomic History   Marital status: Married    Spouse name: Not on file   Number of children: Not on file   Years of education: Not on file   Highest education level: Not on file  Occupational History   Occupation: NP    Employer: [employer]  Tobacco Use   Smoking status: Never   Smokeless tobacco: Current  Vaping Use   Vaping Use: Never used  Substance and Sexual Activity   Alcohol use: Yes    Comment: Rarely   Drug use: No   Sexual activity: Not Currently    Birth control/protection: I.U.D.  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    Social Determinants of Health   Financial Resource Strain: Not on file  Food Insecurity: Not on file  Transportation Needs: Not on file  Physical Activity: Not on file   Stress: Not on file  Social Connections: Not on file  Intimate Partner Violence: Not on file    Outpatient Medications Prior to Visit  Medication Sig Dispense Refill   albuterol (VENTOLIN HFA) 108 (90 Base) MCG/ACT inhaler Inhale 2 puffs into the lungs every 6 (six) hours as needed for wheezing or shortness of breath (Cough). 36 g 2   atorvastatin (LIPITOR) 10 MG tablet Take 1 tablet (10 mg total) by mouth daily. 90 tablet 1   Cholecalciferol 25 MCG (1000 UT) tablet Take 5,000 Units by mouth.     cyanocobalamin 1000 MCG tablet Take by mouth daily.     levonorgestrel (MIRENA) 20 MCG/24HR IUD 1 each by Intrauterine route once.     levothyroxine (SYNTHROID) 200 MCG tablet Take 1 tablet (200 mcg total) by mouth daily. 90 tablet 1   nystatin cream (MYCOSTATIN) Apply 1 application topically 2 (two) times daily. (Patient taking differently: Apply 1 application  topically as needed for dry skin.) 30 g 2   omeprazole (PRILOSEC) 40 MG capsule Take 1 capsule (40 mg total) by mouth daily. 90 capsule 1   sertraline (ZOLOFT) 100 MG tablet Take 1 tablet (100 mg total) by mouth daily. 90 tablet 1   amphetamine-dextroamphetamine (ADDERALL XR) 20 MG 24 hr capsule Take 1 capsule (20 mg total) by mouth daily. 30 capsule 0   No facility-administered medications prior to visit.    Allergies  Allergen Reactions   Codeine Nausea And Vomiting   Bee Venom Other (See Comments)    Review of Systems  Constitutional:  Positive for malaise/fatigue. Negative for fever.  HENT:  Negative for congestion.   Eyes:  Negative for blurred vision.  Respiratory:  Negative for shortness of breath.   Cardiovascular:  Negative for chest pain, palpitations and leg swelling.  Gastrointestinal:  Negative for abdominal pain, blood in stool and nausea.  Genitourinary:  Negative for dysuria and frequency.  Musculoskeletal:  Negative for falls.  Skin:  Negative for rash.  Neurological:  Negative for dizziness, loss of  consciousness and headaches.  Endo/Heme/Allergies:  Negative for environmental allergies.  Psychiatric/Behavioral:  Positive for depression. Negative for suicidal ideas. The patient is nervous/anxious.        Objective:    Physical Exam Constitutional:      General: She is not in acute distress.    Appearance: Normal appearance. She is well-developed. She is not toxic-appearing.  HENT:  Head: Normocephalic and atraumatic.     Right Ear: External ear normal.     Left Ear: External ear normal.     Nose: Nose normal.  Eyes:     General:        Right eye: No discharge.        Left eye: No discharge.     Conjunctiva/sclera: Conjunctivae normal.  Neck:     Thyroid: No thyromegaly.  Cardiovascular:     Rate and Rhythm: Normal rate and regular rhythm.     Heart sounds: Normal heart sounds. No murmur heard. Pulmonary:     Effort: Pulmonary effort is normal. No respiratory distress.     Breath sounds: Normal breath sounds.  Abdominal:     General: Bowel sounds are normal.     Palpations: Abdomen is soft.     Tenderness: There is no abdominal tenderness. There is no guarding.  Musculoskeletal:        General: Normal range of motion.     Cervical back: Neck supple.  Lymphadenopathy:     Cervical: No cervical adenopathy.  Skin:    General: Skin is warm and dry.  Neurological:     Mental Status: She is alert and oriented to person, place, and time.  Psychiatric:        Mood and Affect: Mood normal.        Behavior: Behavior normal.        Thought Content: Thought content normal.        Judgment: Judgment normal.     BP 126/86 (BP Location: Left Arm, Patient Position: Sitting, Cuff Size: Normal)   Pulse 76   Temp 98 F (36.7 C) (Oral)   Resp 16   Ht 5\' 5"  (1.651 m)   Wt 279 lb 12.8 oz (126.9 kg)   SpO2 98%   BMI 46.56 kg/m  Wt Readings from Last 3 Encounters:  11/21/22 279 lb 12.8 oz (126.9 kg)  07/09/22 284 lb 11.6 oz (129.2 kg)  10/30/21 270 lb (122.5 kg)     Diabetic Foot Exam - Simple   No data filed    Lab Results  Component Value Date   WBC 8.0 11/21/2022   HGB 14.4 11/21/2022   HCT 44.7 11/21/2022   PLT 279.0 11/21/2022   GLUCOSE 85 11/21/2022   CHOL 200 11/21/2022   TRIG 124.0 11/21/2022   HDL 45.40 11/21/2022   LDLDIRECT 168.3 06/02/2014   LDLCALC 129 (H) 11/21/2022   ALT 18 11/21/2022   AST 18 11/21/2022   NA 139 11/21/2022   K 4.1 11/21/2022   CL 103 11/21/2022   CREATININE 0.94 11/21/2022   BUN 11 11/21/2022   CO2 28 11/21/2022   TSH 14.03 (H) 11/21/2022   HGBA1C 6.1 11/21/2022    Lab Results  Component Value Date   TSH 14.03 (H) 11/21/2022   Lab Results  Component Value Date   WBC 8.0 11/21/2022   HGB 14.4 11/21/2022   HCT 44.7 11/21/2022   MCV 89.1 11/21/2022   PLT 279.0 11/21/2022   Lab Results  Component Value Date   NA 139 11/21/2022   K 4.1 11/21/2022   CO2 28 11/21/2022   GLUCOSE 85 11/21/2022   BUN 11 11/21/2022   CREATININE 0.94 11/21/2022   BILITOT 0.5 11/21/2022   ALKPHOS 64 11/21/2022   AST 18 11/21/2022   ALT 18 11/21/2022   PROT 7.3 11/21/2022   ALBUMIN 4.0 11/21/2022   CALCIUM 9.5 11/21/2022   GFR 68.63 11/21/2022  Lab Results  Component Value Date   CHOL 200 11/21/2022   Lab Results  Component Value Date   HDL 45.40 11/21/2022   Lab Results  Component Value Date   LDLCALC 129 (H) 11/21/2022   Lab Results  Component Value Date   TRIG 124.0 11/21/2022   Lab Results  Component Value Date   CHOLHDL 4 11/21/2022   Lab Results  Component Value Date   HGBA1C 6.1 11/21/2022       Assessment & Plan:  Preventative health care Assessment & Plan: Patient encouraged to maintain heart healthy diet, regular exercise, adequate sleep. Consider daily probiotics. Take medications as prescribed. Labs ordered and reviewed. Shingrix is the new shingles had one shot just needs second shot over 2-6 months, confirm coverage with insurance and document, then can return here for  shots with nurse appt or at pharmacy. Pap 2019 with GYN will repeat in next year. Colonoscopy 2023 repeat 2033  MM was November 2022, repeat later this year or next year.    Hypothyroidism, unspecified type Assessment & Plan: On Levothyroxine but has missed 1/2 doses this month, check labs with appt later this month continue to monitor  Orders: -     Ambulatory referral to Cardiology -     TSH  Vitamin B 12 deficiency Assessment & Plan: Supplement and monitor   Orders: -     CBC with Differential/Platelet -     Vitamin B12  Obesity due to excess calories with serious comorbidity, unspecified classification Assessment & Plan: Good recent weight loss  Orders: -     Ambulatory referral to Cardiology -     [facility] to [facility]  Hyperlipidemia, unspecified hyperlipidemia type Assessment & Plan: Encourage heart healthy diet such as MIND or DASH diet, increase exercise, avoid trans fats, simple carbohydrates and processed foods, consider a krill or fish or flaxseed oil cap daily.   Orders: -     Ambulatory referral to Cardiology -     Lipid panel -     Comprehensive metabolic panel  Vitamin D deficiency Assessment & Plan: Supplement and monitor   Orders: -     VITAMIN D 25 Hydroxy (Vit-D Deficiency, Fractures)  Hyperglycemia Assessment & Plan: hgba1c acceptable, minimize simple carbs. Increase exercise as tolerated.   Orders: -     Ambulatory referral to Cardiology -     Hemoglobin A1c  Dyspnea, unspecified type -     Ambulatory referral to Cardiology  FH: early coronary artery disease -     Ambulatory referral to Cardiology  OSA on CPAP Assessment & Plan: Uses nightly follows with pulmonology   Encounter for screening for malignant neoplasm of breast, unspecified screening modality -     3D Screening Mammogram, Left and Right; Future  Depression with anxiety Assessment & Plan: She continues to be on leave from work and  paperwork is updated to keep her out of work til 8/19. She is working with behavioral health on counseling and improving on Sertraline 100 mg now     Assessment and Plan    Shortness of Breath: Increased shortness of breath, likely due to weight and deconditioning. No acute cardiac symptoms. Echocardiogram from 1.5 years ago was normal. Family history of early coronary artery disease. -Refer for cardiac evaluation and potential stress test due to worsening symptoms and family history. -Consider repeating echocardiogram in the fall if symptoms continue to worsen.  Obesity: Contributing to shortness of breath. -Encourage increased physical activity and weight  management.  General Health Maintenance: -Plan for second Shingles vaccine. -Annual flu vaccine and COVID-19 booster in the fall. -Schedule mammogram as it is due in the fall. -Schedule Pap smear as it is due in 2024. -Colonoscopy due in 2033.         Danise Edge, MD

## 2022-11-20 NOTE — Assessment & Plan Note (Signed)
Patient encouraged to maintain heart healthy diet, regular exercise, adequate sleep. Consider daily probiotics. Take medications as prescribed. Labs ordered and reviewed. Shingrix is the new shingles shot, 2 shots over 2-6 months, confirm coverage with insurance and document, then can return here for shots with nurse appt or at pharmacy. Pap 2019 with GYN will repeat in next year. Colonoscopy 2023.  MM was November 2022, repeat later this year or next year.

## 2022-11-21 ENCOUNTER — Ambulatory Visit (INDEPENDENT_AMBULATORY_CARE_PROVIDER_SITE_OTHER): Payer: No Typology Code available for payment source | Admitting: Family Medicine

## 2022-11-21 ENCOUNTER — Encounter (HOSPITAL_BASED_OUTPATIENT_CLINIC_OR_DEPARTMENT_OTHER): Payer: Self-pay

## 2022-11-21 ENCOUNTER — Ambulatory Visit (HOSPITAL_BASED_OUTPATIENT_CLINIC_OR_DEPARTMENT_OTHER)
Admission: RE | Admit: 2022-11-21 | Discharge: 2022-11-21 | Disposition: A | Discharge status: Home / Self Care | Payer: No Typology Code available for payment source | Source: Ambulatory Visit | Attending: Family Medicine | Admitting: Family Medicine

## 2022-11-21 VITALS — BP 126/86 | HR 76 | Temp 98.0°F | Resp 16 | Ht 65.0 in | Wt 279.8 lb

## 2022-11-21 DIAGNOSIS — R739 Hyperglycemia, unspecified: Secondary | ICD-10-CM | POA: Diagnosis not present

## 2022-11-21 DIAGNOSIS — Z1231 Encounter for screening mammogram for malignant neoplasm of breast: Secondary | ICD-10-CM | POA: Diagnosis not present

## 2022-11-21 DIAGNOSIS — R92313 Mammographic fatty tissue density, bilateral breasts: Secondary | ICD-10-CM | POA: Insufficient documentation

## 2022-11-21 DIAGNOSIS — E538 Deficiency of other specified B group vitamins: Secondary | ICD-10-CM

## 2022-11-21 DIAGNOSIS — E039 Hypothyroidism, unspecified: Secondary | ICD-10-CM | POA: Diagnosis not present

## 2022-11-21 DIAGNOSIS — Z0000 Encounter for general adult medical examination without abnormal findings: Secondary | ICD-10-CM | POA: Diagnosis not present

## 2022-11-21 DIAGNOSIS — Z1239 Encounter for other screening for malignant neoplasm of breast: Secondary | ICD-10-CM

## 2022-11-21 DIAGNOSIS — G4733 Obstructive sleep apnea (adult) (pediatric): Secondary | ICD-10-CM

## 2022-11-21 DIAGNOSIS — E559 Vitamin D deficiency, unspecified: Secondary | ICD-10-CM | POA: Diagnosis not present

## 2022-11-21 DIAGNOSIS — E6609 Other obesity due to excess calories: Secondary | ICD-10-CM | POA: Diagnosis not present

## 2022-11-21 DIAGNOSIS — Z8249 Family history of ischemic heart disease and other diseases of the circulatory system: Secondary | ICD-10-CM

## 2022-11-21 DIAGNOSIS — R0600 Dyspnea, unspecified: Secondary | ICD-10-CM

## 2022-11-21 DIAGNOSIS — F418 Other specified anxiety disorders: Secondary | ICD-10-CM

## 2022-11-21 DIAGNOSIS — E785 Hyperlipidemia, unspecified: Secondary | ICD-10-CM

## 2022-11-21 NOTE — Patient Instructions (Signed)
Preventive Care 40-55 Years Old, Female Preventive care refers to lifestyle choices and visits with your health care provider that can promote health and wellness. Preventive care visits are also called wellness exams. What can I expect for my preventive care visit? Counseling Your health care provider may ask you questions about your: Medical history, including: Past medical problems. Family medical history. Pregnancy history. Current health, including: Menstrual cycle. Method of birth control. Emotional well-being. Home life and relationship well-being. Sexual activity and sexual health. Lifestyle, including: Alcohol, nicotine or tobacco, and drug use. Access to firearms. Diet, exercise, and sleep habits. Work and work environment. Sunscreen use. Safety issues such as seatbelt and bike helmet use. Physical exam Your health care provider will check your: Height and weight. These may be used to calculate your BMI (body mass index). BMI is a measurement that tells if you are at a healthy weight. Waist circumference. This measures the distance around your waistline. This measurement also tells if you are at a healthy weight and may help predict your risk of certain diseases, such as type 2 diabetes and high blood pressure. Heart rate and blood pressure. Body temperature. Skin for abnormal spots. What immunizations do I need?  Vaccines are usually given at various ages, according to a schedule. Your health care provider will recommend vaccines for you based on your age, medical history, and lifestyle or other factors, such as travel or where you work. What tests do I need? Screening Your health care provider may recommend screening tests for certain conditions. This may include: Lipid and cholesterol levels. Diabetes screening. This is done by checking your blood sugar (glucose) after you have not eaten for a while (fasting). Pelvic exam and Pap test. Hepatitis B test. Hepatitis C  test. HIV (human immunodeficiency virus) test. STI (sexually transmitted infection) testing, if you are at risk. Lung cancer screening. Colorectal cancer screening. Mammogram. Talk with your health care provider about when you should start having regular mammograms. This may depend on whether you have a family history of breast cancer. BRCA-related cancer screening. This may be done if you have a family history of breast, ovarian, tubal, or peritoneal cancers. Bone density scan. This is done to screen for osteoporosis. Talk with your health care provider about your test results, treatment options, and if necessary, the need for more tests. Follow these instructions at home: Eating and drinking  Eat a diet that includes fresh fruits and vegetables, whole grains, lean protein, and low-fat dairy products. Take vitamin and mineral supplements as recommended by your health care provider. Do not drink alcohol if: Your health care provider tells you not to drink. You are pregnant, may be pregnant, or are planning to become pregnant. If you drink alcohol: Limit how much you have to 0-1 drink a day. Know how much alcohol is in your drink. In the [place] one drink equals one 12 oz bottle of beer (355 mL), one 5 oz glass of wine (148 mL), or one 1 oz glass of hard liquor (44 mL). Lifestyle Brush your teeth every morning and night with fluoride toothpaste. Floss one time each day. Exercise for at least 30 minutes 5 or more days each week. Do not use any products that contain nicotine or tobacco. These products include cigarettes, chewing tobacco, and vaping devices, such as e-cigarettes. If you need help quitting, ask your health care provider. Do not use drugs. If you are sexually active, practice safe sex. Use a condom or other form of protection to   prevent STIs. If you do not wish to become pregnant, use a form of birth control. If you plan to become pregnant, see your health care provider for a  prepregnancy visit. Take aspirin only as told by your health care provider. Make sure that you understand how much to take and what form to take. Work with your health care provider to find out whether it is safe and beneficial for you to take aspirin daily. Find healthy ways to manage stress, such as: Meditation, yoga, or listening to music. Journaling. Talking to a trusted person. Spending time with friends and family. Minimize exposure to UV radiation to reduce your risk of skin cancer. Safety Always wear your seat belt while driving or riding in a vehicle. Do not drive: If you have been drinking alcohol. Do not ride with someone who has been drinking. When you are tired or distracted. While texting. If you have been using any mind-altering substances or drugs. Wear a helmet and other protective equipment during sports activities. If you have firearms in your house, make sure you follow all gun safety procedures. Seek help if you have been physically or sexually abused. What's next? Visit your health care provider once a year for an annual wellness visit. Ask your health care provider how often you should have your eyes and teeth checked. Stay up to date on all vaccines. This information is not intended to replace advice given to you by your health care provider. Make sure you discuss any questions you have with your health care provider. Document Revised: 11/08/2020 Document Reviewed: 11/08/2020 Elsevier Patient Education  2024 Elsevier Inc.  

## 2022-11-21 NOTE — Assessment & Plan Note (Signed)
Uses nightly follows with pulmonology

## 2022-11-22 ENCOUNTER — Ambulatory Visit: Payer: No Typology Code available for payment source

## 2022-11-22 ENCOUNTER — Other Ambulatory Visit: Payer: Self-pay

## 2022-11-22 DIAGNOSIS — E538 Deficiency of other specified B group vitamins: Secondary | ICD-10-CM

## 2022-11-22 LAB — VITAMIN B12: Vitamin B-12: 196 pg/mL — ABNORMAL LOW (ref 211–911)

## 2022-11-22 LAB — CBC WITH DIFFERENTIAL/PLATELET
Basophils Absolute: 0.1 10*3/uL (ref 0.0–0.1)
Basophils Relative: 1.1 % (ref 0.0–3.0)
Eosinophils Absolute: 0.2 10*3/uL (ref 0.0–0.7)
Eosinophils Relative: 2.5 % (ref 0.0–5.0)
HCT: 44.7 % (ref 36.0–46.0)
Hemoglobin: 14.4 g/dL (ref 12.0–15.0)
Lymphocytes Relative: 35.4 % (ref 12.0–46.0)
Lymphs Abs: 2.8 10*3/uL (ref 0.7–4.0)
MCHC: 32.2 g/dL (ref 30.0–36.0)
MCV: 89.1 fl (ref 78.0–100.0)
Monocytes Absolute: 0.8 10*3/uL (ref 0.1–1.0)
Monocytes Relative: 10.4 % (ref 3.0–12.0)
Neutro Abs: 4 10*3/uL (ref 1.4–7.7)
Neutrophils Relative %: 50.6 % (ref 43.0–77.0)
Platelets: 279 10*3/uL (ref 150.0–400.0)
RBC: 5.02 Mil/uL (ref 3.87–5.11)
RDW: 13.5 % (ref 11.5–15.5)
WBC: 8 10*3/uL (ref 4.0–10.5)

## 2022-11-22 LAB — COMPREHENSIVE METABOLIC PANEL
ALT: 18 U/L (ref 0–35)
AST: 18 U/L (ref 0–37)
Albumin: 4 g/dL (ref 3.5–5.2)
Alkaline Phosphatase: 64 U/L (ref 39–117)
BUN: 11 mg/dL (ref 6–23)
CO2: 28 mEq/L (ref 19–32)
Calcium: 9.5 mg/dL (ref 8.4–10.5)
Chloride: 103 mEq/L (ref 96–112)
Creatinine, Ser: 0.94 mg/dL (ref 0.40–1.20)
GFR: 68.63 mL/min (ref >60.00)
Glucose, Bld: 85 mg/dL (ref 70–99)
Potassium: 4.1 mEq/L (ref 3.5–5.1)
Sodium: 139 mEq/L (ref 135–145)
Total Bilirubin: 0.5 mg/dL (ref 0.2–1.2)
Total Protein: 7.3 g/dL (ref 6.0–8.3)

## 2022-11-22 LAB — VITAMIN D 25 HYDROXY (VIT D DEFICIENCY, FRACTURES): VITD: 30.21 ng/mL (ref 30.00–100.00)

## 2022-11-22 LAB — LIPID PANEL
Cholesterol: 200 mg/dL (ref 0–200)
HDL: 45.4 mg/dL (ref >39.00)
LDL Cholesterol: 129 mg/dL — ABNORMAL HIGH (ref 0–99)
NonHDL: 154.18
Total CHOL/HDL Ratio: 4
Triglycerides: 124 mg/dL (ref 0.0–149.0)
VLDL: 24.8 mg/dL (ref 0.0–40.0)

## 2022-11-22 LAB — TSH: TSH: 14.03 u[IU]/mL — ABNORMAL HIGH (ref 0.35–5.50)

## 2022-11-22 LAB — HEMOGLOBIN A1C: Hgb A1c MFr Bld: 6.1 % (ref 4.6–6.5)

## 2022-11-22 NOTE — Progress Notes (Signed)
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 Spoke with Clydie Braun at [place] lab patient had a gold top and placed order for future to be released by [facility].

## 2022-11-22 NOTE — Progress Notes (Signed)
Called left message for patient to return call to office in reference to lab results with additional medication instructions.

## 2022-11-24 LAB — INTRINSIC FACTOR ANTIBODIES: Intrinsic Factor: NEGATIVE

## 2022-11-24 NOTE — Assessment & Plan Note (Addendum)
She continues to be on leave from work and paperwork is updated to keep her out of work til 8/19. She is working with behavioral health on counseling and improving on Sertraline 100 mg now

## 2022-11-26 ENCOUNTER — Telehealth: Payer: Self-pay | Admitting: *Deleted

## 2022-11-26 ENCOUNTER — Other Ambulatory Visit: Payer: Self-pay | Admitting: *Deleted

## 2022-11-26 MED ORDER — CYANOCOBALAMIN 1000 MCG/ML IJ SOLN: INTRAMUSCULAR | 3 refills | Status: DC

## 2022-11-26 MED ORDER — LEVOTHYROXINE SODIUM 200 MCG PO TABS: ORAL_TABLET | ORAL | 0 refills | Status: DC

## 2022-11-26 MED ORDER — "SYRINGE 25G X 1"" 3 ML MISC": 0 refills | Status: DC

## 2022-11-26 NOTE — Telephone Encounter (Signed)
Yvonna Alanis called from [facility] about pts disability and needed ov notes from 10/17/22.  Advised her that we did not see pt on that day but we saw her on 10/28/22.  Also advised that she is suppose to be seeing someone in behavioral health.  She advised to disregard fax that she sent.

## 2022-11-26 NOTE — Addendum Note (Signed)
Addended by: Thelma Barge D on: 11/26/2022 05:05 PM   Modules accepted: Orders

## 2022-12-02 ENCOUNTER — Ambulatory Visit (INDEPENDENT_AMBULATORY_CARE_PROVIDER_SITE_OTHER): Payer: No Typology Code available for payment source | Admitting: Adult Health

## 2022-12-02 ENCOUNTER — Encounter: Payer: Self-pay | Admitting: Adult Health

## 2022-12-02 DIAGNOSIS — F411 Generalized anxiety disorder: Secondary | ICD-10-CM

## 2022-12-02 DIAGNOSIS — F331 Major depressive disorder, recurrent, moderate: Secondary | ICD-10-CM

## 2022-12-02 MED ORDER — SERTRALINE HCL 100 MG PO TABS: ORAL_TABLET | ORAL | 1 refills | Status: DC

## 2022-12-02 NOTE — Progress Notes (Signed)
Tiffany Morris [id number] 01/10/1966 57 y.o.  Subjective:   Patient ID:  Tiffany Morris is a 57 y.o. (DOB 1965/12/23) female.  Chief Complaint: No chief complaint on file.   HPI AZYIA COHAGAN presents to the office today for follow-up of GAD and MDD.   Describes mood today as "so-so". Pleasant. Reports tearfulness. Mood symptoms - reports depression, anxiety and irritability. Reports panic attacks - feeling overwhelmed. Reports worry, rumination, and over thinking. Mood is lower. Stating "I still feel down". Having issues in personal and professional life. Reports losing mother in May of 2024 and reports increased mood symptoms. Stating "my husband and son keep me going". She has been taking Zoloft 100mg  daily and is willing to increase dose to 150mg  daily. Has been working with PCP for medication management and she is currently disabled and unable to work. She is scheduled to remain out of work until the end of September. Stating "I'm just not ready right now". Decreased interest and motivation. Taking other medications as prescribed. Energy levels lower. Active, does not have a regular exercise routine.  Enjoys some usual interests and activities. Married. Lives with husband and son. Father and brother local. Mother passed away last year. Spending time with family. Appetite adequate.  Weight gain - 283 pounds. Sleeps well most nights. Averages 7 to 8 hours. Uses CPAP machine. Focus and concentration difficulties. Completing tasks. Managing aspects of household. 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 Denies SI or HI.  Denies AH or VH. Denies substance use. Denies self harm.  Previous medication trials:  Vyvanse, Wellbutrin, Cymbalta  GAD-7    Flowsheet Row Office Visit from 11/21/2022 in [facility] at [facility]  Total GAD-7 Score 3      PHQ2-9    Flowsheet Row Office Visit from 11/21/2022 in [facility] at [facility] Office Visit from 10/30/2021 in [facility] at [facility] Video Visit from 09/13/2021 in [facility] Care at [facility] Office Visit from 10/16/2020 in [facility] Care at [facility] Office Visit from 01/16/2018 in [facility] Care at [facility]  PHQ-2 Total Score 5 4 1 2  0  PHQ-9 Total Score 6 6 3 6  --      Flowsheet Row ED from 05/30/2022 in [facility] at [facility] ED from 01[facility]29/2022 in [facility] Department at [facility] ED from 03/20/2021 in [facility] at [facility] RISK CATEGORY No Risk No Risk No Risk        Review of Systems:  Review of Systems  Musculoskeletal:  Negative for gait problem.  Neurological:  Negative for tremors.  Psychiatric/Behavioral:         Please refer to HPI    Medications: I have reviewed the patient's current medications.  Current Outpatient Medications  Medication Sig Dispense Refill   albuterol (VENTOLIN HFA) 108 (90 Base) MCG/ACT inhaler Inhale 2 puffs into the lungs every 6 (six) hours as needed for wheezing or shortness of breath (Cough). 36 g 2   atorvastatin (LIPITOR) 10 MG tablet Take 1 tablet (10 mg total) by mouth daily. 90 tablet 1   Cholecalciferol 25 MCG (1000 UT) tablet Take 5,000 Units by mouth.     cyanocobalamin (VITAMIN B12) 1000 MCG/ML injection Inject 1ml weekly for 4 weeks, 1ml  biweekly for 4 doses, then inject 1ml monthly. 4 mL 3   levonorgestrel (MIRENA) 20 MCG/24HR IUD 1 each by Intrauterine route once.     levothyroxine (SYNTHROID) 200 MCG tablet Take 2 tablets on Tuesday and Saturdays and 1 tablet on the other days. 120 tablet 0   nystatin cream (MYCOSTATIN) Apply 1 application topically 2 (two) times daily. (Patient taking differently: Apply 1 application  topically as needed for dry skin.) 30 g 2    omeprazole (PRILOSEC) 40 MG capsule Take 1 capsule (40 mg total) by mouth daily. 90 capsule 1   sertraline (ZOLOFT) 100 MG tablet Take one and 1/2 tablets daily. 135 tablet 1   Syringe/Needle, Disp, (SYRINGE 3CC/25GX1") 25G X 1" 3 ML MISC Use as directed with B12. 15 each 0   No current facility-administered medications for this visit.    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   Please see review of systems for further details on the patient's review from today.   Objective:   Physical Exam:  There were no vitals taken for this visit.  Physical Exam Constitutional:      General: She is not in acute distress. Musculoskeletal:        General: No deformity.  Neurological:     Mental Status: She is alert and oriented to person, place, and time.     Coordination: Coordination normal.  Psychiatric:        Attention and Perception: Attention and perception normal. She does not perceive auditory or visual hallucinations.        Mood and Affect: Mood is anxious and depressed. Affect is not labile, blunt, angry or inappropriate.        Speech: Speech normal.        Behavior: Behavior normal.        Thought Content: Thought  content normal. Thought content is not paranoid or delusional. Thought content does not include homicidal or suicidal ideation. Thought content does not include homicidal or suicidal plan.        Cognition and Memory: Cognition and memory normal.        Judgment: Judgment normal.     Comments: Insight intact    Lab Review:     Component Value Date/Time   NA 139 11/21/2022 1545   K 4.1 11/21/2022 1545   CL 103 11/21/2022 1545   CO2 28 11/21/2022 1545   GLUCOSE 85 11/21/2022 1545   BUN 11 11/21/2022 1545   CREATININE 0.94 11/21/2022 1545   CREATININE 0.85 08/20/2013 1711   CALCIUM 9.5 11/21/2022 1545   PROT 7.3 11/21/2022 1545   ALBUMIN 4.0 11/21/2022 1545  AST 18 11/21/2022 1545   ALT 18 11/21/2022 1545   ALKPHOS 64 11/21/2022 1545   BILITOT 0.5 11/21/2022 1545       Component Value Date/Time   WBC 8.0 11/21/2022 1545   RBC 5.02 11/21/2022 1545   HGB 14.4 11/21/2022 1545   HCT 44.7 11/21/2022 1545   PLT 279.0 11/21/2022 1545   MCV 89.1 11/21/2022 1545   MCH 29.4 08/20/2013 1711   MCHC 32.2 11/21/2022 1545   RDW 13.5 11/21/2022 1545   LYMPHSABS 2.8 11/21/2022 1545   MONOABS 0.8 11/21/2022 1545   EOSABS 0.2 11/21/2022 1545   BASOSABS 0.1 11/21/2022 1545    No results found for: "POCLITH", "LITHIUM"   No results found for: "PHENYTOIN", "PHENOBARB", "VALPROATE", "CBMZ"   .res Assessment: Plan:    Treatment Plan/Recommendations:   Plan:  PDMP reviewed  Increase Zoloft 100mg  to 150mg  daily  RTC 4 weeks  Patient totally disabled and unable to return to work at this time. She is to remain out of work through September 30th.   Diagnoses and all orders for this visit:  Major depressive disorder, recurrent episode, moderate (HCC) -     sertraline (ZOLOFT) 100 MG tablet; Take one and 1/2 tablets daily.  Generalized anxiety disorder -     sertraline (ZOLOFT) 100 MG tablet; Take one and 1/2 tablets daily.     Please see After Visit Summary for patient  specific instructions.  Future Appointments  Date Time Provider Department Center  02/06/2023  3:20 PM Bradd Canary, MD [facility]    No orders of the defined types were placed in this encounter.   -------------------------------

## 2022-12-06 ENCOUNTER — Ambulatory Visit: Payer: No Typology Code available for payment source | Attending: Internal Medicine | Admitting: Internal Medicine

## 2022-12-06 ENCOUNTER — Encounter: Payer: Self-pay | Admitting: Internal Medicine

## 2022-12-06 VITALS — BP 118/84 | HR 79 | Ht 65.0 in | Wt 273.8 lb

## 2022-12-06 DIAGNOSIS — R634 Abnormal weight loss: Secondary | ICD-10-CM | POA: Diagnosis not present

## 2022-12-06 DIAGNOSIS — R0600 Dyspnea, unspecified: Secondary | ICD-10-CM | POA: Diagnosis not present

## 2022-12-06 DIAGNOSIS — Z8249 Family history of ischemic heart disease and other diseases of the circulatory system: Secondary | ICD-10-CM

## 2022-12-06 NOTE — Progress Notes (Signed)
Cardiology Office Note  Date: 12/06/2022   ID: Tiffany Morris, DOB February 09, 1966, MRN [id number]  PCP:  Bradd Canary, MD  Cardiologist:  Marjo Bicker, MD Electrophysiologist:  None   Reason for Office Visit: Evaluation of DOE   History of Present Illness: Tiffany Morris is a 57 y.o. female known to have OSA on CPAP, HLD was referred to cardiology clinic for evaluation of DOE.  Patient has ongoing DOE for the last few months.  No orthopnea, PND or leg swelling.  No angina.  No dizziness, palpitations or syncope.  No prior ischemic valuation.  Her paternal grandfather and grandmother had CABG.  No history of PCI or CABG in the immediate family members including parents and siblings.  Denies smoking cigarettes.  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    Hyperglycemia 06/05/2014   Hyperlipidemia 09/05/2011   hypothyroidism 06/02/2010   Obesity    Pain of left breast 08/05/2011   Pedal edema 08/05/2011   Sleep apnea    Thyroid disease    hyper   Unspecified sleep apnea 08/20/2013   Vitamin B 12 deficiency     Past Surgical History:  Procedure Laterality Date   CESAREAN SECTION  05/27/2002   CHOLECYSTECTOMY     laparoscopic   COLONOSCOPY  05/27/2012   EYE SURGERY     RETINAL DETACHMENT REPAIR W/ SCLERAL BUCKLE LE  01/25/2010    Current Outpatient Medications  Medication Sig Dispense Refill   albuterol (VENTOLIN HFA) 108 (90 Base) MCG/ACT inhaler Inhale 2 puffs into the lungs every 6 (six) hours as needed for wheezing or shortness of breath (Cough). 36 g 2   atorvastatin (LIPITOR) 10 MG tablet Take 1 tablet (10 mg total) by mouth daily. 90 tablet 1   Cholecalciferol 25 MCG (1000 UT) tablet Take 5,000 Units by mouth.      cyanocobalamin (VITAMIN B12) 1000 MCG/ML injection Inject 1ml weekly for 4 weeks, 1ml  biweekly for 4 doses, then inject 1ml monthly. 4 mL 3   levonorgestrel (MIRENA) 20 MCG/24HR IUD 1 each by Intrauterine route once.     levothyroxine (SYNTHROID) 200 MCG tablet Take 2 tablets on Tuesday and Saturdays and 1 tablet on the other days. 120 tablet 0   nystatin cream (MYCOSTATIN) Apply 1 application topically 2 (two) times daily. (Patient taking differently: Apply 1 application  topically as needed for dry skin.) 30 g 2   omeprazole (PRILOSEC) 40 MG capsule Take 1 capsule (40 mg total) by mouth daily. 90 capsule 1   sertraline (ZOLOFT) 100 MG tablet Take one and 1/2 tablets daily. 135 tablet 1   Syringe/Needle, Disp, (SYRINGE 3CC/25GX1") 25G X 1" 3 ML MISC Use as directed with B12. 15 each 0   No current facility-administered medications for this visit.   Allergies:  Codeine and Bee venom   Social History: The patient  reports that she has never smoked. She has never used smokeless tobacco. She reports current alcohol use. She reports that she does not use drugs.   Family History: The patient's family history includes ADD / ADHD in her son; Alcohol abuse in her maternal grandfather; Anemia in her mother; Anxiety disorder in her son; Arthritis in her father; Asthma in  her brother; Autism spectrum disorder in her son; COPD in her maternal grandfather; Cancer in her maternal grandfather and maternal grandmother; Deep vein thrombosis in her mother; Depression in her mother and son; Diabetes in her mother; Heart attack in her paternal grandfather and paternal grandmother; Heart disease in her father, mother, paternal grandfather, and paternal grandmother; Heart failure in her mother; Hyperlipidemia in her father, mother, paternal grandfather, and paternal grandmother; Hypertension in her father, paternal grandfather, and paternal grandmother; Hyperthyroidism in her father; Hypothyroidism in her paternal  grandmother; Macular degeneration in her paternal grandfather; Obesity in her father and son; Pulmonary Hypertension in her mother; Pulmonary disease in her mother; Varicose Veins in her paternal grandfather.   ROS:  Please see the history of present illness. Otherwise, complete review of systems is positive for none  All other systems are reviewed and negative.   Physical Exam: VS:  Ht 5\' 5"  (1.651 m)   Wt 273 lb 12.8 oz (124.2 kg)   SpO2 96%   BMI 45.56 kg/m , BMI Body mass index is 45.56 kg/m.  Wt Readings from Last 3 Encounters:  12/06/22 273 lb 12.8 oz (124.2 kg)  11/21/22 279 lb 12.8 oz (126.9 kg)  07/09/22 284 lb 11.6 oz (129.2 kg)    General: Patient appears comfortable at rest. HEENT: Conjunctiva and lids normal, oropharynx clear with moist mucosa. Neck: Supple, no elevated JVP or carotid bruits, no thyromegaly. Lungs: Clear to auscultation, nonlabored breathing at rest. Cardiac: Regular rate and rhythm, no S3 or significant systolic murmur, no pericardial rub. Abdomen: Soft, nontender, no hepatomegaly, bowel sounds present, no guarding or rebound. Extremities: No pitting edema, distal pulses 2+. Skin: Warm and dry. Musculoskeletal: No kyphosis. Neuropsychiatric: Alert and oriented x3, affect grossly appropriate.  Recent Labwork: 11/21/2022: ALT 18; AST 18; BUN 11; Creatinine, Ser 0.94; Hemoglobin 14.4; Platelets 279.0; Potassium 4.1; Sodium 139; TSH 14.03     Component Value Date/Time   CHOL 200 11/21/2022 1545   TRIG 124.0 11/21/2022 1545   HDL 45.40 11/21/2022 1545   CHOLHDL 4 11/21/2022 1545   VLDL 24.8 11/21/2022 1545   LDLCALC 129 (H) 11/21/2022 1545   LDLDIRECT 168.3 06/02/2014 1528    Other Studies Reviewed Today:   Assessment and Plan:    # DOE likely secondary to obesity and deconditioning # Morbid obesity -BMI is 45.56.  Diet and exercise counseling provided.  Weight loss strongly encouraged.  DOE is likely secondary to obesity and deconditioning.   I will reevaluate her symptoms after she loses weight.  Will place Pharm.D. referral for [facility] initiation (for weight loss and presence of weight related comorbidities like OSA and HLD).  # Family history premature CAD in the paternal grandparents -Obtain lipoprotein a and CT calcium scoring of coronaries.  # HLD, not at goal -Reviewed lipid panel from 11/21/2022 that showed normal TG and elevated LDL (129 mg/dL). Continue atorvastatin 10 mg nightly.  Goal LDL is 100.  Diet and exercise counseling provided.  Weight loss strongly encouraged.  # OSA on CPAP -Continue CPAP therapy.  I have spent a total of 45 minutes with patient reviewing chart, EKGs, labs and examining patient as well as establishing an assessment and plan that was discussed with the patient.  > 50% of time was spent in direct patient care.    Medication Adjustments/Labs and Tests Ordered: Current medicines are reviewed at length with the patient today.  Concerns regarding medicines are outlined above.   Tests Ordered: Orders Placed This Encounter  Procedures  EKG 12-Lead    Medication Changes: No orders of the defined types were placed in this encounter.   Disposition:  Follow up  1 year  Signed Tong Pieczynski Verne Spurr, MD, 12/06/2022 2:17 PM    [facility] at [facility] [address] [place], [place], [place] [address]

## 2022-12-06 NOTE — Patient Instructions (Addendum)
Medication Instructions:  Your physician recommends that you continue on your current medications as directed. Please refer to the Current Medication list given to you today.   Labwork: Lipoprotein-A done today at [facility]  Testing/Procedures: Your physician has requested that you have cardiac CT. Cardiac computed tomography (CT) is a painless test that uses an x-ray machine to take clear, detailed pictures of your heart. For further information please visit [email or web address] Please follow instruction sheet as given.    Follow-Up: Your physician recommends that you schedule a follow-up appointment in: 1 year. You will receive a reminder call in the mail in about 8 months reminding you to call and schedule your appointment. If you don't receive this call, please contact our office.   Any Other Special Instructions Will Be Listed Below (If Applicable).  Referral to Pharm D If you need a refill on your cardiac medications before your next appointment, please call your pharmacy.

## 2022-12-23 ENCOUNTER — Other Ambulatory Visit (HOSPITAL_BASED_OUTPATIENT_CLINIC_OR_DEPARTMENT_OTHER): Payer: Self-pay

## 2022-12-27 ENCOUNTER — Other Ambulatory Visit: Payer: Self-pay

## 2022-12-27 DIAGNOSIS — F411 Generalized anxiety disorder: Secondary | ICD-10-CM

## 2022-12-27 DIAGNOSIS — F331 Major depressive disorder, recurrent, moderate: Secondary | ICD-10-CM

## 2022-12-27 MED ORDER — SERTRALINE HCL 100 MG PO TABS: ORAL_TABLET | ORAL | 1 refills | Status: DC

## 2022-12-30 ENCOUNTER — Other Ambulatory Visit: Payer: Self-pay

## 2022-12-30 ENCOUNTER — Ambulatory Visit: Payer: Self-pay | Admitting: Behavioral Health

## 2022-12-30 ENCOUNTER — Encounter: Payer: Self-pay | Admitting: Adult Health

## 2022-12-30 ENCOUNTER — Ambulatory Visit (INDEPENDENT_AMBULATORY_CARE_PROVIDER_SITE_OTHER): Payer: No Typology Code available for payment source | Admitting: Adult Health

## 2022-12-30 DIAGNOSIS — F908 Attention-deficit hyperactivity disorder, other type: Secondary | ICD-10-CM

## 2022-12-30 DIAGNOSIS — F331 Major depressive disorder, recurrent, moderate: Secondary | ICD-10-CM

## 2022-12-30 DIAGNOSIS — F411 Generalized anxiety disorder: Secondary | ICD-10-CM

## 2022-12-30 MED ORDER — OMEPRAZOLE 40 MG PO CPDR: 40.0000 mg | DELAYED_RELEASE_CAPSULE | Freq: Every day | ORAL | 1 refills | Status: DC

## 2022-12-30 MED ORDER — LEVOTHYROXINE SODIUM 200 MCG PO TABS: ORAL_TABLET | ORAL | 0 refills | Status: DC

## 2022-12-30 MED ORDER — METHYLPHENIDATE HCL 20 MG PO TABS: 20.0000 mg | ORAL_TABLET | Freq: Every day | ORAL | 0 refills | Status: AC

## 2022-12-30 MED ORDER — CYANOCOBALAMIN 1000 MCG/ML IJ SOLN: INTRAMUSCULAR | 3 refills | Status: DC

## 2022-12-30 NOTE — Progress Notes (Signed)
Tiffany Morris [id number] 01-May-1966 57 y.o.  Subjective:   Patient ID:  Tiffany Morris is a 57 y.o. (DOB 10/28/1965) female.  Chief Complaint: No chief complaint on file.   HPI Tiffany Morris presents to the office today for follow-up of GAD and MDD.   Describes mood today as "so-so". Pleasant. Reports tearfulness. Mood symptoms - reports decreased depression, anxiety and irritability. Denies recent panic attacks. Reports worry, rumination, and over thinking. Mood is lower, but a little bit better. Stating "I feel like I'm making progress". Reports losing mother in May of 2024. Reports decreased issues in personal and professional life. Feels like the increase in Zoloft to 150mg  daily has been helpful - would like the option of increasing the Zoloft to 200mg  daily. She is planning to retur to work on August 12th, 2024. Improved interest and motivation. Taking other medications as prescribed. Energy levels lower. Active, does not have a regular exercise routine.  Enjoys some usual interests and activities. Married. Lives with husband and son - 6. Father and brother local. Mother passed away last year. Spending time with family. Appetite adequate. Weight loss - 278 from 283 pounds. Sleeps well most nights. Averages 7 to 8 hours. Uses CPAP machine. Focus and concentration difficulties - "still struggling". Willing to try Ritalin - did not tolerate Adderall and Vyvanse. Completing tasks. Managing aspects of household. 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 Denies SI or HI.  Denies AH or VH. Denies substance use. Denies self harm.  Previous medication trials:  Vyvanse, Wellbutrin, Cymbalta  GAD-7    Flowsheet Row Office Visit from 11/21/2022 in [facility] at [facility]  Total GAD-7 Score 3      PHQ2-9    Flowsheet Row Office Visit from 11/21/2022 in [facility] at [facility]  Office Visit from 10/30/2021 in [facility] at [facility] Video Visit from 09/13/2021 in [facility] Care at [facility] Office Visit from 10/16/2020 in [facility] Primary Care at [facility] Office Visit from 01/16/2018 in [facility] Primary Care at [facility]  PHQ-2 Total Score 5 4 1 2  0  PHQ-9 Total Score 6 6 3 6  --      Flowsheet Row ED from 05/30/2022 in [facility] at [facility] ED from 01/29/2022 in [facility] Emergency Department at [facility] ED from 03/20/2021 in [facility] Urgent Care at [facility] RISK CATEGORY No Risk No Risk No Risk        Review of Systems:  Review of Systems  Musculoskeletal:  Negative for gait problem.  Neurological:  Negative for tremors.  Psychiatric/Behavioral:         Please refer to HPI    Medications: I have reviewed the patient's current medications.  Current Outpatient Medications  Medication Sig Dispense Refill   albuterol (VENTOLIN HFA) 108 (90 Base) MCG/ACT inhaler Inhale 2 puffs into the lungs every 6 (six) hours as needed for wheezing or shortness of breath (Cough). 36 g 2   atorvastatin (LIPITOR) 10 MG tablet Take 1 tablet (10 mg total) by mouth daily. 90 tablet 1   Cholecalciferol 25 MCG (1000 UT) tablet Take 5,000 Units by mouth.     cyanocobalamin (VITAMIN B12) 1000 MCG/ML injection Inject 1ml weekly for 4 weeks, 1ml  biweekly for 4 doses, then inject  1ml monthly. 4 mL 3   levonorgestrel (MIRENA) 20 MCG/24HR IUD 1 each by Intrauterine route once.     levothyroxine (SYNTHROID) 200 MCG tablet Take 2 tablets on Tuesday and Saturdays and 1 tablet on the other days. 120 tablet 0   nystatin cream (MYCOSTATIN) Apply 1 application topically 2 (two) times daily. (Patient taking differently: Apply 1 application  topically as needed for dry skin.) 30 g 2   omeprazole (PRILOSEC) 40 MG capsule Take 1 capsule (40 mg  total) by mouth daily. 90 capsule 1   sertraline (ZOLOFT) 100 MG tablet Take one and 1/2 tablets daily. 135 tablet 1   Syringe/Needle, Disp, (SYRINGE 3CC/25GX1") 25G X 1" 3 ML MISC Use as directed with B12. 15 each 0   No current facility-administered medications for this visit.    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   Please see review of systems for further details on the patient's review from today.   Objective:   Physical Exam:  There were no vitals taken for this visit.  Physical Exam Constitutional:      General: She is not in acute distress. Musculoskeletal:        General: No deformity.  Neurological:     Mental Status: She is alert and oriented to person, place, and time.     Coordination: Coordination normal.  Psychiatric:        Attention and Perception: Attention and perception normal. She does not perceive auditory or visual hallucinations.        Mood and Affect: Mood normal. Mood is not anxious or depressed. Affect is not labile, blunt, angry or inappropriate.        Speech: Speech normal.        Behavior: Behavior normal.        Thought Content: Thought content normal. Thought content is not paranoid  or delusional. Thought content does not include homicidal or suicidal ideation. Thought content does not include homicidal or suicidal plan.        Cognition and Memory: Cognition and memory normal.        Judgment: Judgment normal.     Comments: Insight intact     Lab Review:     Component Value Date/Time   NA 139 11/21/2022 1545   K 4.1 11/21/2022 1545   CL 103 11/21/2022 1545   CO2 28 11/21/2022 1545   GLUCOSE 85 11/21/2022 1545   BUN 11 11/21/2022 1545   CREATININE 0.94 11/21/2022 1545   CREATININE 0.85 08/20/2013 1711   CALCIUM 9.5 11/21/2022 1545   PROT 7.3 11/21/2022 1545   ALBUMIN 4.0 11/21/2022 1545   AST 18  11/21/2022 1545   ALT 18 11/21/2022 1545   ALKPHOS 64 11/21/2022 1545   BILITOT 0.5 11/21/2022 1545       Component Value Date/Time   WBC 8.0 11/21/2022 1545   RBC 5.02 11/21/2022 1545   HGB 14.4 11/21/2022 1545   HCT 44.7 11/21/2022 1545   PLT 279.0 11/21/2022 1545   MCV 89.1 11/21/2022 1545   MCH 29.4 08/20/2013 1711   MCHC 32.2 11/21/2022 1545   RDW 13.5 11/21/2022 1545   LYMPHSABS 2.8 11/21/2022 1545   MONOABS 0.8 11/21/2022 1545   EOSABS 0.2 11/21/2022 1545   BASOSABS 0.1 11/21/2022 1545    No results found for: "POCLITH", "LITHIUM"   No results found for: "PHENYTOIN", "PHENOBARB", "VALPROATE", "CBMZ"   .res Assessment: Plan:    Treatment Plan/Recommendations:   Plan:  PDMP reviewed  Zoloft 150mg  to 200mg  daily  Add Ritalin 20mg  daily for ADD  RTC 4 weeks  Patient planning to return to work on  August 12th, 2024.  There are no diagnoses linked to this encounter.   Please see After Visit Summary for patient specific instructions.  Future Appointments  Date Time Provider Department Center  12/30/2022 11:40 AM , Thereasa Solo, NP CP-CP None  01/01/2023  3:30 PM [facility] PHARMACIST [facility] None  01/23/2023  9:00 AM AP-CT 1 AP-CT [place] [facility]  02/06/2023  3:20 PM Bradd Canary, MD [facility]    No orders of the  defined types were placed in this encounter.   -------------------------------

## 2023-01-01 ENCOUNTER — Other Ambulatory Visit (HOSPITAL_COMMUNITY): Payer: Self-pay

## 2023-01-01 ENCOUNTER — Ambulatory Visit: Payer: No Typology Code available for payment source

## 2023-01-01 NOTE — Progress Notes (Deleted)
Office Visit    Patient Name: Tiffany Morris Date of Encounter: 01/01/2023  Primary Care Provider:  Bradd Canary, MD Primary Cardiologist:  Marjo Bicker, MD  Chief Complaint    Weight management  Significant Past Medical History   HLD 6/24 - LDL 129  hypothroid 6/24 - TSH 14.03  preDM 6/24 - A1c 6.1          Allergies  Allergen Reactions   Codeine Nausea And Vomiting   Bee Venom Other (See Comments)    History of Present Illness    Tiffany Morris is a 57 y.o. 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   Social History:   Tobacco:  Alcohol:  Caffeine:   Adherence Assessment  Do you ever forget to take your medication? [] Yes [] No  Do you ever skip doses due to side effects? [] Yes [] No  Do you have trouble affording your medicines? [] Yes [] No  Are you ever unable to pick up your medication due to transportation difficulties? [] Yes [] No  Do you ever stop taking your medications because you don't believe they are helping? [] Yes [] No  Do you check your weight daily? [] Yes [] No   Adherence strategy: ***  Barriers to obtaining medications: ***   Accessory Clinical Findings    Lab Results  Component Value Date   CREATININE 0.94 11/21/2022   BUN 11 11/21/2022   NA 139 11/21/2022   K 4.1 11/21/2022   CL 103 11/21/2022   CO2 28 11/21/2022   Lab Results  Component Value  Date   ALT 18 11/21/2022   AST 18 11/21/2022   ALKPHOS 64 11/21/2022   BILITOT 0.5 11/21/2022   Lab Results  Component Value Date   HGBA1C 6.1 11/21/2022      Home Medications/Allergies    Current Outpatient Medications  Medication Sig Dispense Refill   albuterol (VENTOLIN HFA) 108 (90 Base) MCG/ACT inhaler Inhale 2 puffs into the lungs every 6 (six) hours as needed for wheezing or shortness of breath (Cough). 36 g 2   atorvastatin (LIPITOR) 10 MG tablet Take 1 tablet (10 mg total) by mouth daily. 90 tablet 1   Cholecalciferol 25 MCG (1000 UT) tablet Take 5,000 Units by mouth.     cyanocobalamin (VITAMIN B12) 1000 MCG/ML injection Inject 1ml weekly for 4 weeks, 1ml  biweekly for 4 doses, then inject 1ml monthly. 4 mL 3   levonorgestrel (MIRENA) 20 MCG/24HR IUD 1 each by Intrauterine route once.     levothyroxine (SYNTHROID) 200 MCG tablet Take 2 tablets on Tuesday and Saturdays and 1 tablet on the other days. 120 tablet 0   methylphenidate (RITALIN) 20 MG tablet Take 1 tablet (20 mg total) by mouth daily. 30 tablet 0   nystatin cream (MYCOSTATIN) Apply 1 application topically 2 (two) times daily. (Patient taking differently: Apply 1 application  topically as needed for dry skin.) 30 g 2   omeprazole (PRILOSEC) 40 MG  capsule Take 1 capsule (40 mg total) by mouth daily. 90 capsule 1   sertraline (ZOLOFT) 100 MG tablet Take one and 1/2 tablets daily. 135 tablet 1   Syringe/Needle, Disp, (SYRINGE 3CC/25GX1") 25G X 1" 3 ML MISC Use as directed with B12. 15 each 0   No current facility-administered medications for this visit.     Allergies  Allergen Reactions   Codeine Nausea And Vomiting   Bee Venom Other (See Comments)    Assessment & Plan    No problem-specific Assessment & Plan notes found for this encounter.   Phillips Hay PharmD CPP [facility]  [address] [place], [place] [address] [phone]

## 2023-01-06 ENCOUNTER — Telehealth: Payer: Self-pay | Admitting: Family Medicine

## 2023-01-06 MED ORDER — ATORVASTATIN CALCIUM 10 MG PO TABS: 10.0000 mg | ORAL_TABLET | Freq: Every day | ORAL | 1 refills | Status: DC

## 2023-01-06 NOTE — Telephone Encounter (Signed)
Refill sent.

## 2023-01-06 NOTE — Telephone Encounter (Signed)
Prescription Request  01/06/2023  Is this a "Controlled Substance" medicine? No  LOV: 11/21/2022  What is the name of the medication or equipment? atorvastatin (LIPITOR) 10 MG tablet  Have you contacted your pharmacy to request a refill? Yes   Which pharmacy would you like this sent to?    [facility]) [place], [place] - [address] [place] [address], [place] [address] Phone: [phone]  Patient notified that their request is being sent to the clinical staff for review and that they should receive a response within 2 business days.   Please advise at [phone] Loraine Leriche from [facility]

## 2023-01-06 NOTE — Addendum Note (Signed)
Addended by: Roxanne Gates on: 01/06/2023 11:27 AM   Modules accepted: Orders

## 2023-01-09 ENCOUNTER — Encounter (INDEPENDENT_AMBULATORY_CARE_PROVIDER_SITE_OTHER): Payer: Self-pay

## 2023-01-14 ENCOUNTER — Other Ambulatory Visit: Payer: Self-pay

## 2023-01-14 MED ORDER — "SYRINGE 25G X 1"" 3 ML MISC": 0 refills | Status: AC

## 2023-01-14 MED ORDER — CYANOCOBALAMIN 1000 MCG/ML IJ SOLN: INTRAMUSCULAR | 3 refills | Status: DC

## 2023-01-17 ENCOUNTER — Telehealth: Payer: Self-pay | Admitting: Family Medicine

## 2023-01-17 ENCOUNTER — Other Ambulatory Visit: Payer: Self-pay | Admitting: Family Medicine

## 2023-01-17 MED ORDER — CYANOCOBALAMIN 1000 MCG/ML IJ SOLN
INTRAMUSCULAR | 3 refills | Status: DC
Start: 2023-01-17 — End: 2024-03-25

## 2023-01-17 NOTE — Telephone Encounter (Signed)
Celeste from [facility] called seeking clarification on whether "bi-weekly" for the cyanocobalamin (Vitamin B12) 1000 mcg/mL injection means twice a week or once every two weeks. Please call and advise at [phone].

## 2023-01-17 NOTE — Telephone Encounter (Signed)
 Please send to [facility] instead

## 2023-01-17 NOTE — Telephone Encounter (Signed)
Can you please clarify sig please   Notify Vitamin B12 is low. Start vitamin B12 shots, 1000 mcg shots IM weekly x 4 doses then biweekly x 4 doses then monthly.

## 2023-01-23 ENCOUNTER — Ambulatory Visit (HOSPITAL_COMMUNITY): Payer: No Typology Code available for payment source

## 2023-02-06 ENCOUNTER — Ambulatory Visit: Payer: No Typology Code available for payment source | Admitting: Family Medicine

## 2023-03-10 ENCOUNTER — Telehealth: Payer: No Typology Code available for payment source | Admitting: Physician Assistant

## 2023-03-10 DIAGNOSIS — M25522 Pain in left elbow: Secondary | ICD-10-CM | POA: Diagnosis not present

## 2023-03-10 MED ORDER — MELOXICAM 15 MG PO TABS: 15.0000 mg | ORAL_TABLET | Freq: Every day | ORAL | 0 refills | Status: AC

## 2023-03-10 NOTE — Progress Notes (Signed)
Virtual Visit Consent   Tiffany Morris, you are scheduled for a virtual visit with a [facility] provider today. Just as with appointments in the office, your consent must be obtained to participate. Your consent will be active for this visit and any virtual visit you may have with one of our providers in the next 365 days. If you have a MyChart account, a copy of this consent can be sent to you electronically.  As this is a virtual visit, video technology does not allow for your provider to perform a traditional examination. This may limit your provider's ability to fully assess your condition. If your provider identifies any concerns that need to be evaluated in person or the need to arrange testing (such as labs, EKG, etc.), we will make arrangements to do so. Although advances in technology are sophisticated, we cannot ensure that it will always work on either your end or our end. If the connection with a video visit is poor, the visit may have to be switched to a telephone visit. With either a video or telephone visit, we are not always able to ensure that we have a secure connection.  By engaging in this virtual visit, you consent to the provision of healthcare and authorize for your insurance to be billed (if applicable) for the services provided during this visit. Depending on your insurance coverage, you may receive a charge related to this service.  I need to obtain your verbal consent now. Are you willing to proceed with your visit today? Tiffany Morris has provided verbal consent on 03/10/2023 for a virtual visit (video or telephone). Margaretann Loveless, PA-C  Date: 03/10/2023 3:40 PM  Virtual Visit via Video Note   I, Margaretann Loveless, connected with  Tiffany Morris  ([id number], 01-30-1966) on 03/10/23 at  3:30 PM EDT by a video-enabled telemedicine application and verified that I am speaking with the correct person using two identifiers.  Location: Patient: Virtual  Visit Location Patient: Home Provider: Virtual Visit Location Provider: Home Office   I discussed the limitations of evaluation and management by telemedicine and the availability of in person appointments. The patient expressed understanding and agreed to proceed.    History of Present Illness: Tiffany Morris is a 57 y.o. who identifies as a female who was assigned female at birth, and is being seen today for elbow pain.  HPI: Arm Pain  The incident occurred more than 1 week ago (2 weeks). There was no injury mechanism. The pain is present in the left elbow. The quality of the pain is described as stabbing (sharp). The pain radiates to the left arm (left up to shoulder). The pain is moderate. Associated symptoms include muscle weakness. Pertinent negatives include no numbness or tingling. The symptoms are aggravated by lifting. She has tried NSAIDs and heat for the symptoms. The treatment provided no relief.     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    Allergies:  Allergies  Allergen Reactions   Codeine Nausea And  Vomiting   Bee Venom Other (See Comments)   Medications:  Current Outpatient Medications:    albuterol (VENTOLIN HFA) 108 (90 Base) MCG/ACT inhaler, Inhale 2 puffs into the lungs every 6 (six) hours as needed for wheezing or shortness of breath (Cough)., Disp: 36 g, Rfl: 2   atorvastatin (LIPITOR) 10 MG tablet, Take 1 tablet (10 mg total) by mouth daily., Disp: 90 tablet, Rfl: 1   Cholecalciferol 25 MCG (1000 UT) tablet, Take 5,000 Units by  mouth., Disp: , Rfl:    cyanocobalamin (VITAMIN B12) 1000 MCG/ML injection, Inject 1ml [place] weekly for 4 weeks, then 1ml [employer] every other weeks for 4 doses, then inject 1ml [place] monthly., Disp: 4 mL, Rfl: 3   levonorgestrel (MIRENA) 20 MCG/24HR IUD, 1 each by Intrauterine route once., Disp: , Rfl:    levothyroxine (SYNTHROID) 200 MCG tablet, Take 2 tablets on Tuesday and Saturdays and 1 tablet on the other days., Disp: 120 tablet, Rfl: 0   meloxicam (MOBIC) 15 MG tablet, Take 1 tablet (15 mg total) by mouth daily., Disp: 30 tablet, Rfl: 0   methylphenidate (RITALIN) 20 MG tablet, Take 1 tablet (20 mg total) by mouth daily., Disp: 30 tablet, Rfl: 0   nystatin cream (MYCOSTATIN), Apply 1 application topically 2 (two) times daily. (Patient taking differently: Apply 1 application  topically as needed for dry skin.), Disp: 30 g, Rfl: 2   omeprazole (PRILOSEC) 40 MG capsule, Take 1 capsule (40 mg total) by mouth daily., Disp: 90 capsule, Rfl: 1   sertraline (ZOLOFT) 100 MG tablet, Take one and 1/2 tablets daily., Disp: 135 tablet, Rfl: 1   Syringe/Needle, Disp, (SYRINGE 3CC/25GX1") 25G X 1" 3 ML MISC, Use as directed with B12., Disp: 15 each, Rfl: 0  Observations/Objective: Patient is well-developed, well-nourished in no acute distress.  Resting comfortably at home.  Head is normocephalic, atraumatic.  No labored breathing.  Speech is clear and coherent with logical content.  Patient is alert and oriented at baseline.    Assessment and Plan: 1. Left elbow pain - meloxicam (MOBIC) 15 MG tablet; Take 1 tablet (15 mg total) by mouth daily.  Dispense: 30 tablet; Refill: 0  - Possible biceps tendinitis at elbow - Meloxicam prescribed; take for at least 14 days - Compression sleeve - Heat before activity and at rest - Ice after any activity - Rest - Follow up in person at local [facility] if continues to worsen or fails to improve  Follow Up Instructions: I discussed the assessment and  treatment plan with the patient. The patient was provided an opportunity to ask questions and all were answered. The patient agreed with the plan and demonstrated an understanding of the instructions.  A copy of instructions were sent to the patient via MyChart unless otherwise noted below.    The patient was advised to call back or seek an in-person evaluation if the symptoms worsen or if the condition fails to improve as anticipated.    Margaretann Loveless, PA-C

## 2023-03-10 NOTE — Patient Instructions (Signed)
Tiffany Morris, thank you for joining Tiffany Loveless, PA-C for today's virtual visit.  While this provider is not your primary care provider (PCP), if your PCP is located in our provider database this encounter information will be shared with them immediately following your visit.   A [facility] MyChart account gives you access to today's visit and all your visits, tests, and labs performed at [facility] " click here if you don't have a [facility] MyChart account or go to [email or web address]  Consent: (Patient) Tiffany Morris provided verbal consent for this virtual visit at the beginning of the encounter.  Current Medications:  Current Outpatient Medications:    albuterol (VENTOLIN HFA) 108 (90 Base) MCG/ACT inhaler, Inhale 2 puffs into the lungs every 6 (six) hours as needed for wheezing or shortness of breath (Cough)., Disp: 36 g, Rfl: 2   atorvastatin (LIPITOR) 10 MG tablet, Take 1 tablet (10 mg total) by mouth daily., Disp: 90 tablet, Rfl: 1   Cholecalciferol 25 MCG (1000 UT) tablet, Take 5,000 Units by mouth., Disp: , Rfl:    cyanocobalamin (VITAMIN B12) 1000 MCG/ML injection, Inject 1ml [place] weekly for 4 weeks, then 1ml [place] every other weeks for 4 doses, then inject 1ml [place] monthly., Disp: 4 mL, Rfl: 3   levonorgestrel (MIRENA) 20 MCG/24HR IUD, 1 each by Intrauterine route once., Disp: , Rfl:    levothyroxine (SYNTHROID) 200 MCG tablet, Take 2 tablets on Tuesday and Saturdays and 1 tablet on the other days., Disp: 120 tablet, Rfl: 0   meloxicam (MOBIC) 15 MG tablet, Take 1 tablet (15 mg total) by mouth daily., Disp: 30 tablet, Rfl: 0   methylphenidate (RITALIN) 20 MG tablet, Take 1 tablet (20 mg total) by mouth daily., Disp: 30 tablet, Rfl: 0   nystatin cream (MYCOSTATIN), Apply 1 application topically 2 (two) times daily. (Patient taking differently: Apply 1 application  topically as needed for dry skin.), Disp: 30 g, Rfl: 2   omeprazole (PRILOSEC) 40 MG  capsule, Take 1 capsule (40 mg total) by mouth daily., Disp: 90 capsule, Rfl: 1   sertraline (ZOLOFT) 100 MG tablet, Take one and 1/2 tablets daily., Disp: 135 tablet, Rfl: 1   Syringe/Needle, Disp, (SYRINGE 3CC/25GX1") 25G X 1" 3 ML MISC, Use as directed with B12., Disp: 15 each, Rfl: 0   Medications ordered in this encounter:  Meds ordered this encounter  Medications   meloxicam (MOBIC) 15 MG tablet    Sig: Take 1 tablet (15 mg total) by mouth daily.    Dispense:  30 tablet    Refill:  0    Order Specific Question:   Supervising Provider    Answer:   Merrilee Jansky [id number]     *If you need refills on other medications prior to your next appointment, please contact your pharmacy*  Follow-Up: Call back or seek an in-person evaluation if the symptoms worsen or if the condition fails to improve as anticipated.  [facility] [phone]  Other Instructions  [facility] Location: [facility] [address], [address] [place], [place] [address] Phone: [phone] Convenient hours: Monday - Friday: 11 a.m. - 7 p.m. Visit our website to schedule an appointment online or walk-in during clinic hours. Your well-being is our priority. Move better with [place]!   [facility] [address] [place], [place] [address] CONTACT Phone: [phone] Fax: [phone] URGENT CARE HOURS Monday-Sunday: 9:00AM - 9:00PM  HOLIDAY  HOURS Holidays: 8:30AM - 4:30PM   [facility] [address], [place], [place] [address] URGENT CARE HOURS Monday - Friday: 8:00am to 8:00pm Saturday: 10:00am to 3:00pm [phone]  Elbow and Forearm Exercises Ask your health care provider which exercises are safe for you. Do exercises exactly as told by your provider and adjust them as directed. It is normal to feel mild stretching, pulling, tightness, or discomfort as you do these exercises.  Stop right away if you feel sudden pain or your pain gets worse. Do not begin these exercises until told by your provider. Range-of-motion exercises These exercises warm up your muscles and joints and improve the movement and flexibility of your injured elbow and forearm. The exercises also help to relieve pain, numbness, and tingling. These exercises are done using the muscles in your injured elbow and forearm (active). Elbow flexion, active  Hold your left / right arm at your side, and bend your elbow (flexion) as far as you can using only your arm muscles. Hold this position for __________ seconds. Slowly return to the starting position. Repeat __________ times. Complete this exercise __________ times a day. Elbow extension, active  Hold your left / right arm at your side, and straighten your elbow (extension) as much as you can using only your arm muscles. Hold this position for __________ seconds. Slowly return to the starting position. Repeat __________ times. Complete this exercise __________ times a day. Forearm rotation, supination This is an exercise in which you turn (rotate) your forearm palm-up (supination). Stand or sit with your elbows at your sides. Bend your left / right elbow to a 90-degree angle (right angle). Position your forearm so that the thumb is facing the ceiling (neutral position). Rotate your palm up toward the ceiling until you feel a gentle stretch on the inside of your forearm. If told, use your other hand to help rotate your forearm farther until you feel a gentle to moderate stretch. Hold this position for __________ seconds. Slowly return to the starting position. Repeat __________ times. Complete this exercise __________ times a day. Forearm rotation, pronation This is an exercise in which you turn (rotate) your forearm palm-down (pronation). Stand or sit with your elbows at your sides. Bend your left / right elbow to a 90-degree angle (right angle).  Position your forearm so that the thumb is facing the ceiling (neutral position). Rotate your palm down until you feel a gentle stretch on the top of your forearm. If told, use your other hand to help rotate your forearm farther until you feel a gentle to moderate stretch. Hold this position for __________ seconds. Slowly return to the starting position. Repeat __________ times. Complete this exercise __________ times a day. Stretching exercises These exercises warm up your muscles and joints and improve the movement and flexibility of your injured elbow and forearm. These exercises also help to relieve pain, numbness, and tingling. These exercises are done using your healthy elbow and forearm to help stretch the muscles in your injured elbow and forearm (active-assisted). Elbow flexion, active-assisted  Hold your left / right arm at your side, and bend your elbow (flexion) as much as you can using your left / right arm muscles. Use your other hand to bend your left / right elbow farther. To do this, gently push up on your forearm until you feel a gentle stretch on the back of your elbow. Hold this position for __________ seconds. Slowly return to the starting position. Repeat __________ times. Complete this exercise __________  times a day. Elbow extension, active-assisted  Hold your left / right arm at your side, and straighten your elbow (extension) as much as you can using your left / right arm muscles. Use your other hand to straighten the left / right elbow farther. To do this, gently push down on your forearm until you feel a gentle stretch on the inside of your elbow. Hold this position for __________ seconds. Slowly return to the starting position. Repeat __________ times. Complete this exercise __________ times a day. Passive elbow flexion, supine  Lie on your back (supine position). Extend your left / right arm up in the air, bracing it with your other hand. Let your left / right  hand slowly lower toward your shoulder (passive flexion) while your elbow stays pointed toward the ceiling. You should feel a gentle stretch along the back of your upper arm and elbow. If told by your provider, you may add a small wrist weight or hand weight to increase the stretch. Hold this position for __________ seconds. Slowly return to the starting position. Repeat __________ times. Complete this exercise __________ times a day. Passive elbow extension, supine  Lie on your back (supine position). Make sure that you are in a comfortable position that lets you relax your arm muscles. Place a folded towel under your left / right upper arm so your elbow and shoulder are at the same height. Straighten your left / right arm so your elbow does not rest on the bed or towel. Let the weight of your hand stretch your elbow (passive extension). Keep your arm and chest muscles relaxed. You should feel a stretch on the inside of your elbow. If told by your provider, you may add a small wrist weight or hand weight to increase the stretch. Hold this position for __________ seconds. Slowly release the stretch. Repeat __________ times. Complete this exercise __________ times a day. Strengthening exercises These exercises build strength and endurance in your elbow and forearm. Endurance is the ability to use your muscles for a long time, even after they get tired. Elbow flexion, isometric  Stand or sit up straight. Bend your left / right elbow in a 90-degree angle (right angle). Keep your forearm at the height of your waist. Your thumb should be pointed toward the ceiling (neutral forearm). Place your other hand on top of your left / right forearm. Gently push down while you resist with your left / right arm (isometric flexion). Push as hard as you can with both arms without causing any pain or movement at your left / right elbow. Hold this position for __________ seconds. Slowly release the tension in both  arms. Let your muscles relax completely before you repeat the exercise. Repeat __________ times. Complete this exercise __________ times a day. Elbow extension, isometric  Stand or sit up straight. Place your left / right arm so your palm faces your abdomen and is at the height of your waist. Place your other hand on the underside of your left / right forearm. Gently push up while you resist with your left / right arm (isometric extension). Push as hard as you can with both arms without causing any pain or movement at your left / right elbow. Hold this position for __________ seconds. Slowly release the tension in both arms. Let your muscles relax completely before you repeat the exercise. Repeat __________ times. Complete this exercise __________ times a day. Elbow flexion with forearm palm up  Sit on a firm chair without  armrests or stand up. Place your left / right arm at your side with your elbow straight and your palm facing forward. Holding a __________ weight or gripping a rubber exercise band or tubing, bend your elbow to bring your hand toward your shoulder (flexion). Hold this position for __________ seconds. Slowly return to the starting position. Repeat __________ times. Complete this exercise __________ times a day. Elbow extension, active  Sit on a firm chair without armrests or stand up. Hold a rubber exercise band or tubing in both hands. Keeping your upper arms at your sides, bring both hands up to your left / right shoulder. Keep your left / right hand just below your other hand. Straighten your left / right elbow (extension) while keeping your other arm still. Hold this position for __________ seconds. Control the resistance of the band or tubing as you return to the starting position. Repeat __________ times. Complete this exercise __________ times a day. Forearm rotation with weight, supination  Sit with your left / right forearm supported on a table. Your elbow  should be at waist height and bent at a 90-degree angle (right angle). Gently grasp a lightweight hammer. Rest your hand over the edge of the table with your palm facing down. Without moving your left / right elbow, slowly rotate your forearm to turn your palm up toward the ceiling (supination). Hold this position for __________ seconds. Slowly return to the starting position. Repeat __________ times. Complete this exercise __________ times a day. Forearm rotation with weight, pronation  Sit with your left / right forearm supported on a table. Keep your elbow below shoulder height. Gently grasp a lightweight hammer. Rest your hand over the edge of the table with your palm facing up. Without moving your left / right elbow, slowly rotate your forearm to turn your palm down toward the floor (pronation). Hold this position for __________ seconds. Slowly return to the starting position. Repeat __________ times. Complete this exercise __________ times a day. This information is not intended to replace advice given to you by your health care provider. Make sure you discuss any questions you have with your health care provider. Document Revised: 09/30/2022 Document Reviewed: 02/22/2022 Elsevier Patient Education  2024 Elsevier Inc.   If you have been instructed to have an in-person evaluation today at a local Urgent Care facility, please use the link below. It will take you to a list of all of our available [facility], including address, phone number and hours of operation. Please do not delay care.  [facility]  If you or a family member do not have a primary care provider, use the link below to schedule a visit and establish care. When you choose a [facility] primary care physician or advanced practice provider, you gain a long-term partner in health. Find a Primary Care Provider  Learn more about [facility] in-office and virtual care options:  - Get  Care Now

## 2023-03-20 ENCOUNTER — Other Ambulatory Visit: Payer: Self-pay | Admitting: Family Medicine

## 2023-03-21 ENCOUNTER — Ambulatory Visit (INDEPENDENT_AMBULATORY_CARE_PROVIDER_SITE_OTHER): Payer: No Typology Code available for payment source | Admitting: Physician Assistant

## 2023-03-21 VITALS — BP 124/86 | HR 82 | Temp 98.1°F | Ht 65.0 in | Wt 276.2 lb

## 2023-03-21 DIAGNOSIS — E538 Deficiency of other specified B group vitamins: Secondary | ICD-10-CM

## 2023-03-21 DIAGNOSIS — Z23 Encounter for immunization: Secondary | ICD-10-CM | POA: Diagnosis not present

## 2023-03-21 DIAGNOSIS — F418 Other specified anxiety disorders: Secondary | ICD-10-CM

## 2023-03-21 DIAGNOSIS — E039 Hypothyroidism, unspecified: Secondary | ICD-10-CM

## 2023-03-21 DIAGNOSIS — Z124 Encounter for screening for malignant neoplasm of cervix: Secondary | ICD-10-CM | POA: Diagnosis not present

## 2023-03-21 LAB — TSH: TSH: 0.63 u[IU]/mL (ref 0.35–5.50)

## 2023-03-21 LAB — VITAMIN B12: Vitamin B-12: 243 pg/mL (ref 211–911)

## 2023-03-21 LAB — T4, FREE: Free T4: 1.11 ng/dL (ref 0.60–1.60)

## 2023-03-21 NOTE — Assessment & Plan Note (Signed)
Repeat vit b12 level. Manages with self injections at home.

## 2023-03-21 NOTE — Assessment & Plan Note (Signed)
Tsh elevated historically, pt inconsistent w/ taking meds. Managed on levo 200 mcg. Repeat tsh/t4 today.

## 2023-03-21 NOTE — Progress Notes (Signed)
Established patient visit   Patient: Tiffany Morris   DOB: 1965/06/04   57 y.o. Female  MRN: [id number] Visit Date: 03/21/2023  Today's healthcare provider: Alfredia Ferguson, PA-C   Chief Complaint  Patient presents with   Medical Management of Chronic Issues    F/u from CPE with Abner Greenspan in June... patient missed appt last month with her.      Subjective    Pt presents today for follow up on thyroid function, vitamin B12 deficiency, and depression.  Pt reports she did see a psychiatrist once, but only for her insurance to approve FMLA. She did return to work in August, but has been out again for several weeks 2/2 depression.  She is looking for a new position.   She reports some better consistency at taking her medication, but depression can keep her from taking them regularly.   Medications: Outpatient Medications Prior to Visit  Medication Sig   albuterol (VENTOLIN HFA) 108 (90 Base) MCG/ACT inhaler Inhale 2 puffs into the lungs every 6 (six) hours as needed for wheezing or shortness of breath (Cough).   atorvastatin (LIPITOR) 10 MG tablet Take 1 tablet (10 mg total) by mouth daily.   Cholecalciferol 25 MCG (1000 UT) tablet Take 5,000 Units by mouth.   cyanocobalamin (VITAMIN B12) 1000 MCG/ML injection Inject 1ml [place] weekly for 4 weeks, then 1ml [place] every other weeks for 4 doses, then inject 1ml [place] monthly.   levonorgestrel (MIRENA) 20 MCG/24HR IUD 1 each by Intrauterine route once.   levothyroxine (SYNTHROID) 200 MCG tablet TAKE 2 TABLETS BY MOUTH ON  TUESDAY AND SATURDAYS AND 1  TABLET BY MOUTH ON THE OTHER  DAYS   meloxicam (MOBIC) 15 MG tablet Take 1 tablet (15 mg total) by mouth daily.   methylphenidate (RITALIN) 20 MG tablet Take 1 tablet (20 mg total) by mouth daily.   nystatin cream (MYCOSTATIN) Apply 1 application topically 2 (two) times daily. (Patient taking differently: Apply 1 application  topically as needed for dry skin.)   omeprazole (PRILOSEC) 40 MG  capsule Take 1 capsule (40 mg total) by mouth daily.   sertraline (ZOLOFT) 100 MG tablet Take one and 1/2 tablets daily.   Syringe/Needle, Disp, (SYRINGE 3CC/25GX1") 25G X 1" 3 ML MISC Use as directed with B12.   No facility-administered medications prior to visit.    Review of Systems  Constitutional:  Negative for fatigue and fever.  Respiratory:  Negative for cough and shortness of breath.   Cardiovascular:  Negative for chest pain and leg swelling.  Gastrointestinal:  Negative for abdominal pain.  Neurological:  Negative for dizziness and headaches.       Objective    BP 124/86   Pulse 82   Temp 98.1 F (36.7 C)   Ht 5\' 5"  (1.651 m)   Wt 276 lb 4 oz (125.3 kg)   SpO2 95%   BMI 45.97 kg/m    Physical Exam Constitutional:      General: She is awake.     Appearance: She is well-developed.  HENT:     Head: Normocephalic.  Eyes:     Conjunctiva/sclera: Conjunctivae normal.  Cardiovascular:     Rate and Rhythm: Normal rate and regular rhythm.     Heart sounds: Normal heart sounds.  Pulmonary:     Effort: Pulmonary effort is normal.     Breath sounds: Normal breath sounds.  Skin:    General: Skin is warm.  Neurological:  Mental Status: She is alert and oriented to person, place, and time.  Psychiatric:        Attention and Perception: Attention normal.        Mood and Affect: Mood normal.        Speech: Speech normal.        Behavior: Behavior is cooperative.      No results found for any visits on 03/21/23.  Assessment & Plan     Problem List Items Addressed This Visit       Endocrine   Hypothyroid    Tsh elevated historically, pt inconsistent w/ taking meds. Managed on levo 200 mcg. Repeat tsh/t4 today.      Relevant Orders   TSH   T4, free     Other   Vitamin B 12 deficiency - Primary    Repeat vit b12 level. Manages with self injections at home.      Relevant Orders   Vitamin B12   Depression with anxiety    Currently managed on  zoloft 150 mg.  Encouraged therapy      Other Visit Diagnoses     Cervical cancer screening       Relevant Orders   Ambulatory referral to Gynecology   Immunization due       Relevant Orders   Zoster Recombinant (Shingrix ) (Completed)        Return in about 6 months (around 09/19/2023) for CPE.       Alfredia Ferguson, PA-C  [facility] at [facility] [phone] (phone) [phone] (fax)  [facility]

## 2023-03-21 NOTE — Assessment & Plan Note (Signed)
Currently managed on zoloft 150 mg.  Encouraged therapy

## 2023-03-21 NOTE — Patient Instructions (Signed)
Virtual therapy: [email or web address]  Emergency Mental Health Services:  Crisis Hotline: 40  HandlingCost.fr  Substance Abuse and Mental Health Services Administration [facility]) Hotline:  5707396074 [phone])  [facility] (like an urgent care for mental health) [address], [place], [place] [address] [phone]  Therapy/ Psychiatry Offices:  [facility] [address] [place] [address] [place],  [place]  [address] [phone] [email or web address]  [facility] [address]  [place], [place] [address] [phone] [email or web address]  [facility] [address] [place], [place] [address] [phone] [email or web address]  [facility]  [address], [address], [place] [address] [phone] [email or web address]  Dr. Milagros Evener  [address] [place] [address] [phone]       [email or web address]  [facility]  [address] [place] [address]. [phone] [email or web address]  [facility] [address] [place], [place] [address] [phone]  [address] [place], [place] [address] [phone] [email or web address]  [facility] [address] [place], [place] [address] [phone] [email or web address]

## 2023-05-26 ENCOUNTER — Other Ambulatory Visit: Payer: Self-pay | Admitting: Adult Health

## 2023-05-26 DIAGNOSIS — F331 Major depressive disorder, recurrent, moderate: Secondary | ICD-10-CM

## 2023-05-26 DIAGNOSIS — F411 Generalized anxiety disorder: Secondary | ICD-10-CM

## 2023-05-26 NOTE — Telephone Encounter (Signed)
Please schedule pt an appt. Lv 08/5 due back in 4 weeks.

## 2023-06-05 ENCOUNTER — Other Ambulatory Visit: Payer: Self-pay | Admitting: Family Medicine

## 2023-06-05 NOTE — Telephone Encounter (Signed)
 Called pt 06/05/23- LM to schedule appt

## 2023-06-11 NOTE — Telephone Encounter (Signed)
 Spoke to patient on 1/15@ 10:42a.  She said Tiffany Morris told her she could continue to get her meds from her PCP.  She said that's her plan.

## 2023-07-19 IMAGING — MG MM DIGITAL SCREENING BILAT W/ TOMO AND CAD
6 of 10 series · 6 of 30 positions shown · non-contrast
Comparison: Previous exam(s).

ACR Breast Density Category a: The breast tissue is almost entirely
fatty.

CLINICAL DATA: Screening.

EXAM:
DIGITAL SCREENING BILATERAL MAMMOGRAM WITH TOMOSYNTHESIS AND CAD
TECHNIQUE: Bilateral screening digital craniocaudal and mediolateral oblique
mammograms were obtained. Bilateral screening digital breast
tomosynthesis was performed. The images were evaluated with
computer-aided detection.

[R CC synth-2D]
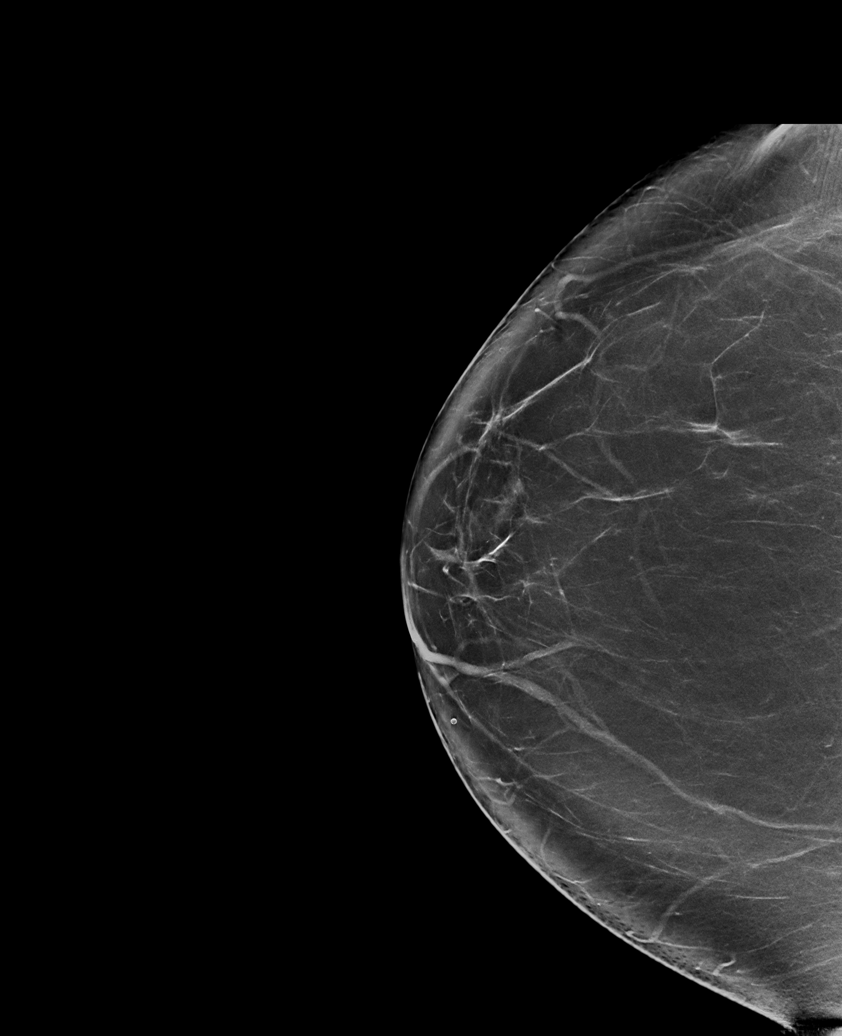

[L MLO synth-2D]
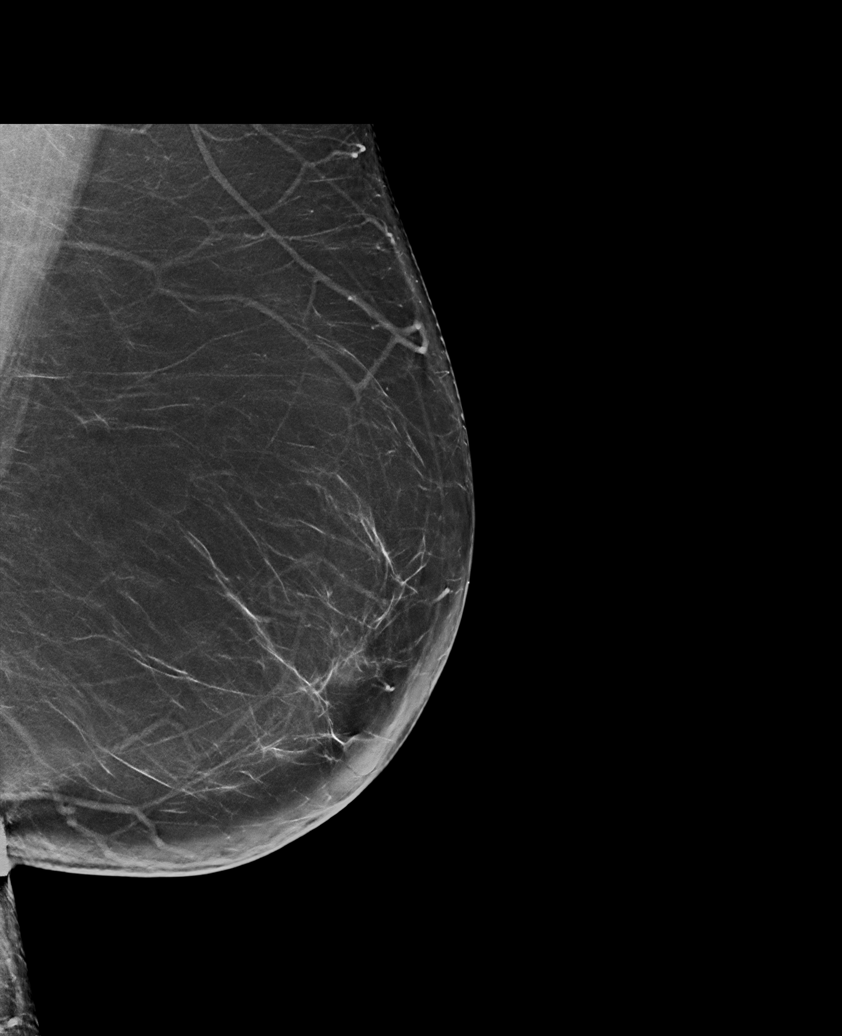

[L CC synth-2D (1 of 2)]
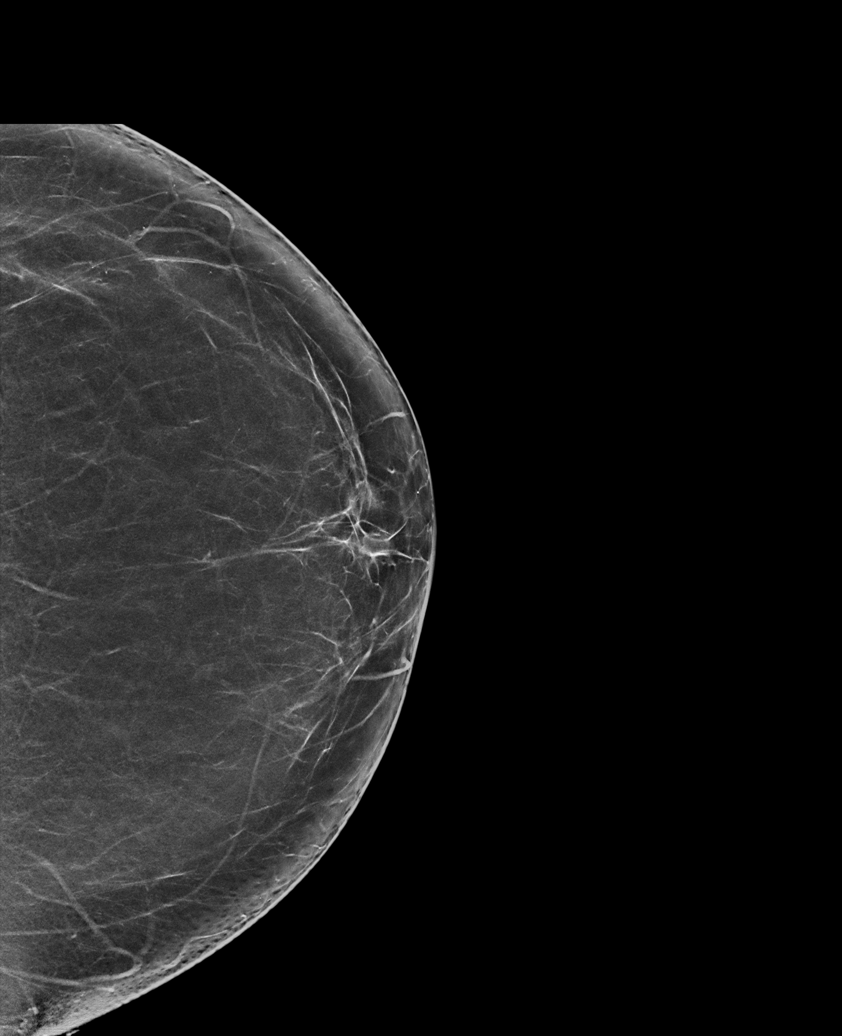

[L CC synth-2D (2 of 2)]
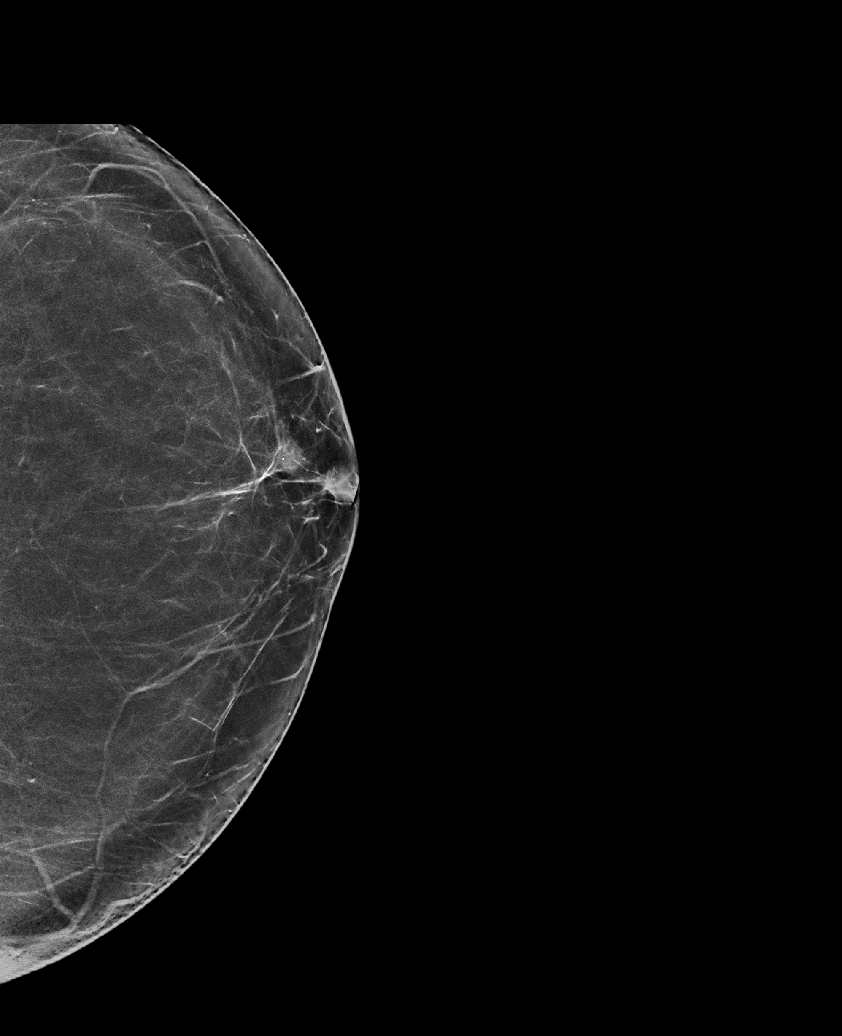

[R MLO synth-2D]
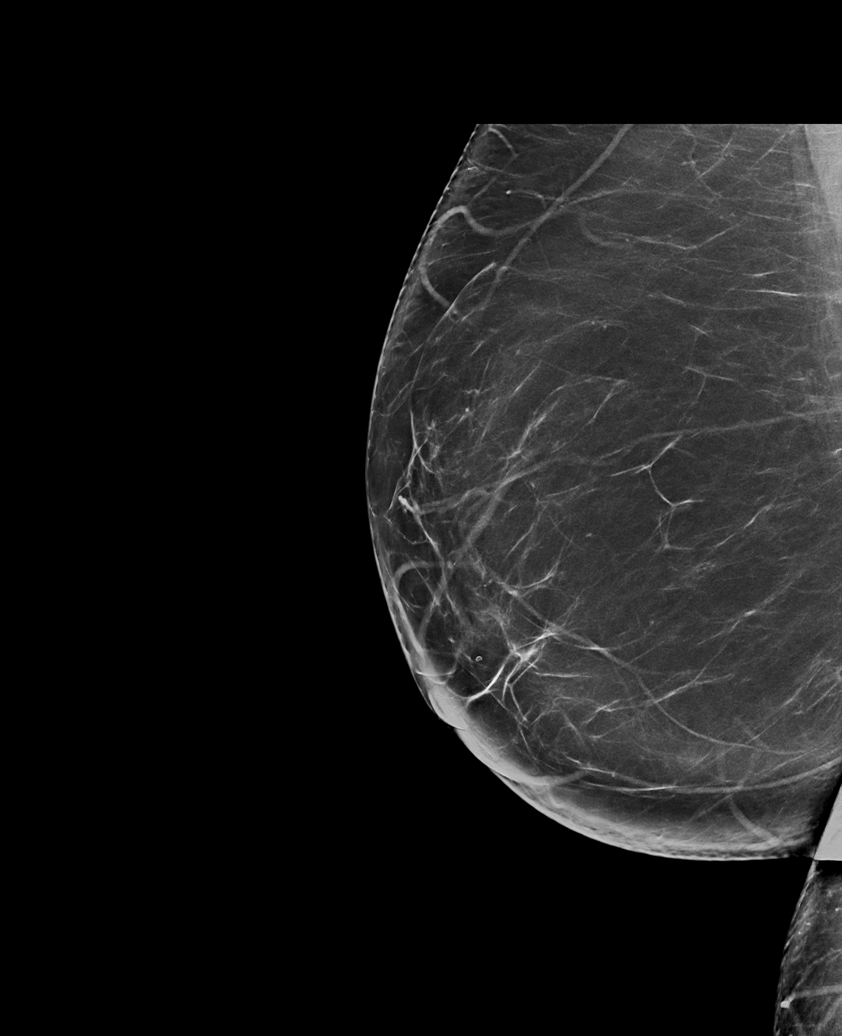

[L CC tomo · tomo slice 37/72.0]
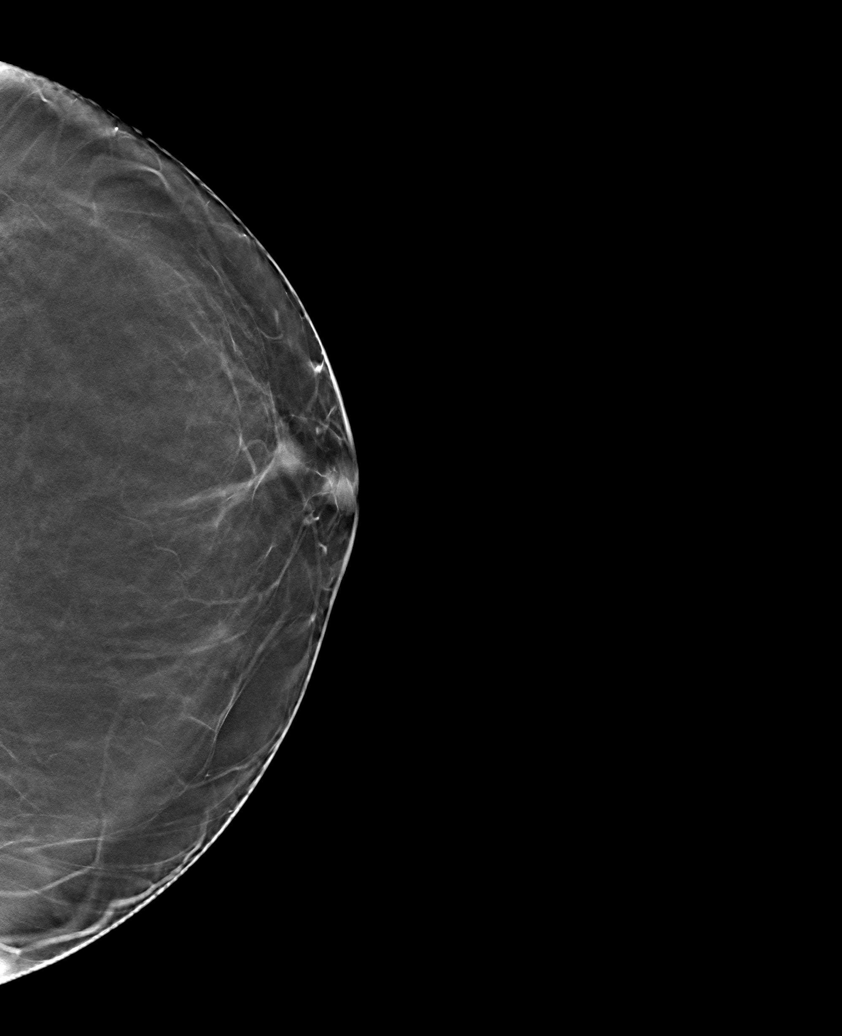

[6 of 30 positions shown; findings below may reference images not displayed]

FINDINGS: There are no findings suspicious for malignancy.
IMPRESSION: No mammographic evidence of malignancy. A result letter of this
screening mammogram will be mailed directly to the patient.

RECOMMENDATION:
Screening mammogram in one year. (Code:0E-3-N98)

BI-RADS CATEGORY  1: Negative.

## 2023-10-27 ENCOUNTER — Ambulatory Visit (HOSPITAL_BASED_OUTPATIENT_CLINIC_OR_DEPARTMENT_OTHER): Admitting: Pulmonary Disease

## 2023-11-10 ENCOUNTER — Ambulatory Visit (HOSPITAL_BASED_OUTPATIENT_CLINIC_OR_DEPARTMENT_OTHER): Admitting: Pulmonary Disease

## 2023-11-10 ENCOUNTER — Encounter (HOSPITAL_BASED_OUTPATIENT_CLINIC_OR_DEPARTMENT_OTHER): Payer: Self-pay | Admitting: Pulmonary Disease

## 2023-11-10 VITALS — BP 114/66 | HR 79 | Ht 65.0 in | Wt 266.1 lb

## 2023-11-10 DIAGNOSIS — G4733 Obstructive sleep apnea (adult) (pediatric): Secondary | ICD-10-CM | POA: Diagnosis not present

## 2023-11-10 NOTE — Progress Notes (Signed)
   Subjective:    Patient ID: Tiffany Morris, female    DOB: Nov 24, 1965, 58 y.o.   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   She now assists with disability determinationfor veterans OSA was diagnosed in 2015, she was placed on CPAP of 8 cm  Due to weight gain, we changed her to AutoSet 8 to 12 cm in 2022  15 month FU   The CPAP machine functions well, with consistent nightly use, including during naps. Settings range from eight to twelve centimeters with minimal leaks. Sleep quality has significantly improved. A full face mask is used due to mouth breathing, occasionally causing dry mouth.  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 No current wheezing or respiratory issues.  CPAP report: Excellent compliance. About eight hours per night. Good control of events. Pressure setting avg at 11.6 cm H2O. Auto settings range from 8 to 12 cm H2O. Minimal leak. (11/10/2023)  Significant tests/ events reviewed   .NPSG 07/2013:  AHI 24/hr, cpap to 8 cm.  Review of Systems neg for any significant sore throat, dysphagia, itching, sneezing, nasal congestion or excess/ purulent secretions, fever, chills, sweats, unintended wt loss, pleuritic or exertional cp, hempoptysis, orthopnea pnd or change in chronic leg swelling. Also denies presyncope, palpitations, heartburn, abdominal pain, nausea, vomiting, diarrhea or change in bowel or urinary habits, dysuria,hematuria, rash, arthralgias, visual complaints, headache, numbness weakness or ataxia.     Objective:   Physical Exam  Gen. Pleasant, obese, in no distress ENT - no lesions, no post nasal drip Neck: No JVD, no thyromegaly, no carotid bruits Lungs: no use of accessory muscles, no dullness to percussion, decreased without rales or rhonchi  Cardiovascular: Rhythm regular, heart sounds  normal, no murmurs or gallops, no peripheral  edema Musculoskeletal: No deformities, no cyanosis or clubbing , no tremors        Assessment & Plan:   Obstructive Sleep Apnea Obstructive sleep apnea is well-controlled with CPAP therapy. She reports excellent compliance, using the CPAP for approximately eight hours per night, including naps. The CPAP settings range from eight to twelve centimeters, with a maximum pressure of 9.9 cm H2O. Minimal leak is present, and she reports significant improvement in sleep quality. A weight loss of approximately twenty pounds may contribute to symptom improvement. She is satisfied with the current treatment and does not wish to explore other options like the Inspire device, given the good results with CPAP. - Continue CPAP therapy with current settings. - Ensure regular supply of CPAP equipment and address any supplier issues. - Consider virtual follow-up visits for convenience.  ADHD ADHD symptoms are managed with Ritalin , though she does not take it consistently due to side effects such as xerostomia. Symptom overlap with sleep apnea, such as impaired concentration, complicates management. Initial ADHD evaluation led to the diagnosis of sleep apnea. - Continue current management of ADHD with Ritalin  as needed.

## 2023-11-25 ENCOUNTER — Encounter: Payer: No Typology Code available for payment source | Admitting: Family Medicine

## 2024-01-14 ENCOUNTER — Encounter (HOSPITAL_BASED_OUTPATIENT_CLINIC_OR_DEPARTMENT_OTHER): Payer: Self-pay | Admitting: Pulmonary Disease

## 2024-01-14 NOTE — Telephone Encounter (Signed)
**Note De-identified  [place] Obfuscation** Please advise 

## 2024-01-24 ENCOUNTER — Other Ambulatory Visit: Payer: Self-pay | Admitting: Family Medicine

## 2024-02-12 ENCOUNTER — Telehealth: Payer: Self-pay

## 2024-02-12 NOTE — Telephone Encounter (Signed)
 Received referral and called patient to schedule her pap smear. Left voicemail for her to call back and schedule appointment.

## 2024-03-21 NOTE — Assessment & Plan Note (Signed)
On Levothyroxine, continue to monitor 

## 2024-03-21 NOTE — Assessment & Plan Note (Signed)
 Patient encouraged to maintain heart healthy diet, regular exercise, adequate sleep. Consider daily probiotics. Take medications as prescribed. Labs ordered and reviewed.  Pap 2019 with GYN will repeat in next year.  Colonoscopy 2023 repeat 2030  MM was 10/2022, repeat later this year or next year.

## 2024-03-21 NOTE — Assessment & Plan Note (Signed)
 Encourage heart healthy diet such as MIND or DASH diet, increase exercise, avoid trans fats, simple carbohydrates and processed foods, consider a krill or fish or flaxseed oil cap daily.

## 2024-03-21 NOTE — Assessment & Plan Note (Signed)
 Supplement and monitor

## 2024-03-21 NOTE — Assessment & Plan Note (Signed)
 Encouraged DASH or MIND diet, decrease po intake and increase exercise as tolerated. Needs 7-8 hours of sleep nightly. Avoid trans fats, eat small, frequent meals every 4-5 hours with lean proteins, complex carbs and healthy fats. Minimize simple carbs, high fat foods and processed foods

## 2024-03-21 NOTE — Assessment & Plan Note (Signed)
 [id number] acceptable, minimize simple carbs. Increase exercise as tolerated.

## 2024-03-25 ENCOUNTER — Ambulatory Visit (INDEPENDENT_AMBULATORY_CARE_PROVIDER_SITE_OTHER): Admitting: Family Medicine

## 2024-03-25 VITALS — BP 122/84 | HR 70 | Temp 98.1°F | Ht 65.0 in | Wt 280.0 lb

## 2024-03-25 DIAGNOSIS — M2550 Pain in unspecified joint: Secondary | ICD-10-CM

## 2024-03-25 DIAGNOSIS — Z124 Encounter for screening for malignant neoplasm of cervix: Secondary | ICD-10-CM

## 2024-03-25 DIAGNOSIS — Z1231 Encounter for screening mammogram for malignant neoplasm of breast: Secondary | ICD-10-CM

## 2024-03-25 DIAGNOSIS — F411 Generalized anxiety disorder: Secondary | ICD-10-CM

## 2024-03-25 DIAGNOSIS — E039 Hypothyroidism, unspecified: Secondary | ICD-10-CM

## 2024-03-25 DIAGNOSIS — E6609 Other obesity due to excess calories: Secondary | ICD-10-CM

## 2024-03-25 DIAGNOSIS — F331 Major depressive disorder, recurrent, moderate: Secondary | ICD-10-CM

## 2024-03-25 DIAGNOSIS — Z0000 Encounter for general adult medical examination without abnormal findings: Secondary | ICD-10-CM

## 2024-03-25 DIAGNOSIS — E538 Deficiency of other specified B group vitamins: Secondary | ICD-10-CM | POA: Diagnosis not present

## 2024-03-25 DIAGNOSIS — E559 Vitamin D deficiency, unspecified: Secondary | ICD-10-CM

## 2024-03-25 DIAGNOSIS — E785 Hyperlipidemia, unspecified: Secondary | ICD-10-CM | POA: Diagnosis not present

## 2024-03-25 DIAGNOSIS — R739 Hyperglycemia, unspecified: Secondary | ICD-10-CM

## 2024-03-25 MED ORDER — OMEPRAZOLE 40 MG PO CPDR: 40.0000 mg | DELAYED_RELEASE_CAPSULE | Freq: Every day | ORAL | 1 refills | Status: AC

## 2024-03-25 MED ORDER — LEVOTHYROXINE SODIUM 200 MCG PO TABS: ORAL_TABLET | ORAL | 1 refills | Status: DC

## 2024-03-25 MED ORDER — ATORVASTATIN CALCIUM 10 MG PO TABS
10.0000 mg | ORAL_TABLET | Freq: Every day | ORAL | 1 refills | Status: AC
Start: 2024-03-25 — End: ?

## 2024-03-25 MED ORDER — SERTRALINE HCL 100 MG PO TABS: ORAL_TABLET | ORAL | 1 refills | Status: AC

## 2024-03-25 MED ORDER — TIRZEPATIDE-WEIGHT MANAGEMENT 2.5 MG/0.5ML ~~LOC~~ SOLN: 2.5000 mg | SUBCUTANEOUS | 1 refills | Status: AC

## 2024-03-25 NOTE — Patient Instructions (Signed)
 Preventive Care 58-56 Years Old, Female  Preventive care refers to lifestyle choices and visits with your health care provider that can promote health and wellness. Preventive care visits are also called wellness exams.  What can I expect for my preventive care visit?  Counseling  Your health care provider may ask you questions about your:  Medical history, including:  Past medical problems.  Family medical history.  Pregnancy history.  Current health, including:  Menstrual cycle.  Method of birth control.  Emotional well-being.  Home life and relationship well-being.  Sexual activity and sexual health.  Lifestyle, including:  Alcohol, nicotine or tobacco, and drug use.  Access to firearms.  Diet, exercise, and sleep habits.  Work and work Astronomer.  Sunscreen use.  Safety issues such as seatbelt and bike helmet use.  Physical exam  Your health care provider will check your:  Height and weight. These may be used to calculate your BMI (body mass index). BMI is a measurement that tells if you are at a healthy weight.  Waist circumference. This measures the distance around your waistline. This measurement also tells if you are at a healthy weight and may help predict your risk of certain diseases, such as type 2 diabetes and high blood pressure.  Heart rate and blood pressure.  Body temperature.  Skin for abnormal spots.  What immunizations do I need?    Vaccines are usually given at various ages, according to a schedule. Your health care provider will recommend vaccines for you based on your age, medical history, and lifestyle or other factors, such as travel or where you work.  What tests do I need?  Screening  Your health care provider may recommend screening tests for certain conditions. This may include:  Lipid and cholesterol levels.  Diabetes screening. This is done by checking your blood sugar (glucose) after you have not eaten for a while (fasting).  Pelvic exam and Pap test.  Hepatitis B test.  Hepatitis C  test.  HIV (human immunodeficiency virus) test.  STI (sexually transmitted infection) testing, if you are at risk.  Lung cancer screening.  Colorectal cancer screening.  Mammogram. Talk with your health care provider about when you should start having regular mammograms. This may depend on whether you have a family history of breast cancer.  BRCA-related cancer screening. This may be done if you have a family history of breast, ovarian, tubal, or peritoneal cancers.  Bone density scan. This is done to screen for osteoporosis.  Talk with your health care provider about your test results, treatment options, and if necessary, the need for more tests.  Follow these instructions at home:  Eating and drinking    Eat a diet that includes fresh fruits and vegetables, whole grains, lean protein, and low-fat dairy products.  Take vitamin and mineral supplements as recommended by your health care provider.  Do not drink alcohol if:  Your health care provider tells you not to drink.  You are pregnant, may be pregnant, or are planning to become pregnant.  If you drink alcohol:  Limit how much you have to 0-1 drink a day.  Know how much alcohol is in your drink. In the [place] one drink equals one 12 oz bottle of beer (355 mL), one 5 oz glass of wine (148 mL), or one 1 oz glass of hard liquor (44 mL).  Lifestyle  Brush your teeth every morning and night with fluoride toothpaste. Floss one time each day.  Exercise for at least  30 minutes 5 or more days each week.  Do not use any products that contain nicotine or tobacco. These products include cigarettes, chewing tobacco, and vaping devices, such as e-cigarettes. If you need help quitting, ask your health care provider.  Do not use drugs.  If you are sexually active, practice safe sex. Use a condom or other form of protection to prevent STIs.  If you do not wish to become pregnant, use a form of birth control. If you plan to become pregnant, see your health care provider for a  prepregnancy visit.  Take aspirin only as told by your health care provider. Make sure that you understand how much to take and what form to take. Work with your health care provider to find out whether it is safe and beneficial for you to take aspirin daily.  Find healthy ways to manage stress, such as:  Meditation, yoga, or listening to music.  Journaling.  Talking to a trusted person.  Spending time with friends and family.  Minimize exposure to UV radiation to reduce your risk of skin cancer.  Safety  Always wear your seat belt while driving or riding in a vehicle.  Do not drive:  If you have been drinking alcohol. Do not ride with someone who has been drinking.  When you are tired or distracted.  While texting.  If you have been using any mind-altering substances or drugs.  Wear a helmet and other protective equipment during sports activities.  If you have firearms in your house, make sure you follow all gun safety procedures.  Seek help if you have been physically or sexually abused.  What's next?  Visit your health care provider once a year for an annual wellness visit.  Ask your health care provider how often you should have your eyes and teeth checked.  Stay up to date on all vaccines.  This information is not intended to replace advice given to you by your health care provider. Make sure you discuss any questions you have with your health care provider.  Document Revised: 11/08/2020 Document Reviewed: 11/08/2020  Elsevier Patient Education  2024 [employer].

## 2024-03-26 ENCOUNTER — Telehealth (HOSPITAL_BASED_OUTPATIENT_CLINIC_OR_DEPARTMENT_OTHER): Payer: Self-pay

## 2024-03-26 ENCOUNTER — Ambulatory Visit: Payer: Self-pay | Admitting: Family Medicine

## 2024-03-26 LAB — COMPREHENSIVE METABOLIC PANEL WITH GFR
ALT: 23 U/L (ref 0–35)
AST: 19 U/L (ref 0–37)
Albumin: 4.1 g/dL (ref 3.5–5.2)
Alkaline Phosphatase: 70 U/L (ref 39–117)
BUN: 10 mg/dL (ref 6–23)
CO2: 29 meq/L (ref 19–32)
Calcium: 9.4 mg/dL (ref 8.4–10.5)
Chloride: 103 meq/L (ref 96–112)
Creatinine, Ser: 0.95 mg/dL (ref 0.40–1.20)
GFR: 67.13 mL/min (ref >60.00)
Glucose, Bld: 104 mg/dL — ABNORMAL HIGH (ref 70–99)
Potassium: 4.1 meq/L (ref 3.5–5.1)
Sodium: 139 meq/L (ref 135–145)
Total Bilirubin: 0.6 mg/dL (ref 0.2–1.2)
Total Protein: 7.6 g/dL (ref 6.0–8.3)

## 2024-03-26 LAB — LIPID PANEL
Cholesterol: 174 mg/dL (ref 0–200)
HDL: 47.2 mg/dL (ref >39.00)
LDL Cholesterol: 98 mg/dL (ref 0–99)
NonHDL: 127.16
Total CHOL/HDL Ratio: 4
Triglycerides: 144 mg/dL (ref 0.0–149.0)
VLDL: 28.8 mg/dL (ref 0.0–40.0)

## 2024-03-26 LAB — CBC WITH DIFFERENTIAL/PLATELET
Basophils Absolute: 0.1 K/uL (ref 0.0–0.1)
Basophils Relative: 0.7 % (ref 0.0–3.0)
Eosinophils Absolute: 0.2 K/uL (ref 0.0–0.7)
Eosinophils Relative: 2.5 % (ref 0.0–5.0)
HCT: 45.1 % (ref 36.0–46.0)
Hemoglobin: 14.9 g/dL (ref 12.0–15.0)
Lymphocytes Relative: 37 % (ref 12.0–46.0)
Lymphs Abs: 2.7 K/uL (ref 0.7–4.0)
MCHC: 32.9 g/dL (ref 30.0–36.0)
MCV: 87.4 fl (ref 78.0–100.0)
Monocytes Absolute: 0.6 K/uL (ref 0.1–1.0)
Monocytes Relative: 8.2 % (ref 3.0–12.0)
Neutro Abs: 3.7 K/uL (ref 1.4–7.7)
Neutrophils Relative %: 51.6 % (ref 43.0–77.0)
Platelets: 280 K/uL (ref 150.0–400.0)
RBC: 5.17 Mil/uL — ABNORMAL HIGH (ref 3.87–5.11)
RDW: 13.4 % (ref 11.5–15.5)
WBC: 7.2 K/uL (ref 4.0–10.5)

## 2024-03-26 LAB — TSH: TSH: 0.22 u[IU]/mL — ABNORMAL LOW (ref 0.35–5.50)

## 2024-03-26 LAB — HEMOGLOBIN A1C: Hgb A1c MFr Bld: 6.5 % (ref 4.6–6.5)

## 2024-03-26 LAB — VITAMIN B12: Vitamin B-12: 228 pg/mL (ref 211–911)

## 2024-03-26 LAB — SEDIMENTATION RATE: Sed Rate: 31 mm/h — ABNORMAL HIGH (ref 0–30)

## 2024-03-28 ENCOUNTER — Encounter: Payer: Self-pay | Admitting: Family Medicine

## 2024-03-28 NOTE — Progress Notes (Signed)
 Subjective:    Patient ID: Tiffany Morris, female    DOB: Jan 31, 1966, 58 y.o.   MRN: [id number]  Chief Complaint  Patient presents with   Annual Exam    HPI Discussed the use of AI scribe software for clinical note transcription with the patient, who gave verbal consent to proceed.  History of Present Illness Tiffany Morris is a 58 year old female who presents for an annual physical exam and concerns about weight gain.  She has experienced significant weight gain, particularly since entering her fifties and is working with a administrator, arts to try and manage her symptoms and weight.  She has a history of obstructive sleep apnea and uses a CPAP machine nightly. She follows up with pulmonology and had a sleep study in 2015, which showed moderate obstructive sleep apnea. She recently discussed medication options for sleep apnea with her pulmonologist, but no medication was prescribed.  She experiences joint pain, which she associates with weight gain and menopause. She describes difficulty rising from a seated position and increased pain after sitting for extended periods. Her niece has juvenile rheumatoid arthritis, raising concerns about a possible hereditary component.  She takes several medications, including atorvastatin  for cholesterol, meloxicam  as needed for pain, levothyroxine  for thyroid  function, and Zoloft  for mood. She also uses omeprazole  regularly for gastrointestinal symptoms and has a history of vitamin B12 deficiency, for which she has previously received injections but is considering sublingual supplements.    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   Qualifier: Diagnosis of  By: Pauline MD, Garnette  H/o of hyper secondary to thyrotoxicosis    Hyperglycemia 06/05/2014   Hyperlipidemia 09/05/2011    hypothyroidism 06/02/2010   Obesity    Pain of left breast 08/05/2011   Pedal edema 08/05/2011   Sleep apnea    Thyroid  disease    hyper   Unspecified sleep apnea 08/20/2013   Vitamin B 12 deficiency     Past Surgical History:  Procedure Laterality Date   CESAREAN SECTION  05/27/2002   CHOLECYSTECTOMY     laparoscopic   COLONOSCOPY  05/27/2012   EYE SURGERY     RETINAL DETACHMENT REPAIR W/ SCLERAL BUCKLE LE  01/25/2010    Family History  Problem Relation Age of Onset   Heart disease Mother        CHF   Pulmonary disease Mother        IF, PAH   Hyperlipidemia Mother    Diabetes Mother        borderline   Deep vein thrombosis Mother    Depression Mother    Pulmonary Hypertension Mother    Anemia Mother        IDA   Heart failure Mother    Hyperlipidemia Father    Hypertension Father    Hyperthyroidism Father    Obesity Father    Arthritis Father    Heart disease Father    Asthma Brother    Autism spectrum disorder Son    ADD / ADHD Son    Anxiety disorder Son    Depression Son    Obesity Son    Cancer Maternal Grandmother        lung- smoker   COPD Maternal Grandfather        smoked   Cancer Maternal Grandfather  bladder   Alcohol abuse Maternal Grandfather    Heart disease Paternal Grandmother        CHF, MI   Hypothyroidism Paternal Grandmother    Hypertension Paternal Grandmother    Heart attack Paternal Grandmother    Hyperlipidemia Paternal Grandmother    Hypertension Paternal Grandfather    Heart disease Paternal Grandfather        open heart surgery   Hyperlipidemia Paternal Grandfather    Macular degeneration Paternal Grandfather    Varicose Veins Paternal Grandfather    Heart attack Paternal Grandfather     Social History   Socioeconomic History   Marital status: Married    Spouse name: Not on file   Number of children: Not on file   Years of education: Not on file   Highest education level: Master's degree (e.g., MA, MS, MEng,  MEd, MSW, MBA)  Occupational History   Occupation: NP    Employer: [employer]  Tobacco Use   Smoking status: Never   Smokeless tobacco: Never  Vaping Use   Vaping status: Never Used  Substance and Sexual Activity   Alcohol use: Yes    Comment: Rarely   Drug use: No   Sexual activity: Not Currently    Birth control/protection: I.U.D.  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    Social Drivers of Corporate Investment Banker Strain: Low Risk  (03/25/2024)   Overall Financial Resource Strain (CARDIA)    Difficulty of Paying Living Expenses: Not hard at all  Food Insecurity: No Food Insecurity (03/25/2024)   Hunger Vital Sign    Worried About Running Out of Food in the Last Year: Never true    Ran Out of Food in the Last Year: Never true  Transportation Needs: No Transportation Needs (03/25/2024)   PRAPARE - Administrator, Civil Service (Medical): No    Lack of Transportation (Non-Medical): No  Physical Activity: Inactive (03/25/2024)   Exercise Vital Sign    Days of Exercise per Week: 0 days    Minutes of Exercise per Session: Not on file  Stress: No Stress Concern Present (03/25/2024)   [employer] of Occupational Health - Occupational Stress Questionnaire    Feeling of Stress: Only a little  Social Connections: Moderately Integrated (03/25/2024)   Social Connection and Isolation Panel    Frequency of Communication with Friends and Family: Three times a week    Frequency of Social Gatherings with Friends and Family: Three times a week    Attends Religious Services: More than 4 times per year    Active Member of Clubs or Organizations: No    Attends Engineer, Structural: Not on file    Marital Status: Married  Catering Manager Violence: Not on file    Outpatient Medications Prior to Visit  Medication Sig Dispense Refill    Cholecalciferol 25 MCG (1000 UT) tablet Take 5,000 Units by mouth.     levonorgestrel (MIRENA) 20 MCG/24HR IUD 1 each by Intrauterine route once.     methylphenidate  (RITALIN ) 20 MG tablet Take 1 tablet (20 mg total) by mouth daily. 30 tablet 0   nystatin  cream (MYCOSTATIN ) Apply 1 application topically 2 (two) times daily. (Patient taking differently: Apply 1 application  topically as needed for dry skin.) 30 g 2   Syringe/Needle, Disp, (SYRINGE 3CC/25GX1) 25G X 1  3 ML MISC Use as directed with B12. 15 each 0   albuterol  (VENTOLIN  HFA) 108 (90 Base) MCG/ACT inhaler Inhale 2 puffs into the lungs every 6 (six) hours as needed for wheezing or shortness of breath (Cough). 36 g 2   atorvastatin  (LIPITOR) 10 MG tablet Take 1 tablet (10 mg total) by mouth daily. 90 tablet 1   cyanocobalamin  (VITAMIN B12) 1000 MCG/ML injection Inject 1ml [place] weekly for 4 weeks, then 1ml [place] every other weeks for 4 doses, then inject 1ml [place] monthly. 4 mL 3   levothyroxine  (SYNTHROID ) 200 MCG tablet TAKE 2 TABLETS BY MOUTH ONCE DAILY ON TUESDAY AND SATURDAY THEN TAKE 1 TABLET ONCE DAILY ON THE OTHER DAYS 12 tablet 0   omeprazole  (PRILOSEC) 40 MG capsule Take 1 capsule (40 mg total) by mouth daily. 90 capsule 1   sertraline  (ZOLOFT ) 100 MG tablet Take one and 1/2 tablets daily. 135 tablet 1   meloxicam  (MOBIC ) 15 MG tablet Take 1 tablet (15 mg total) by mouth daily. (Patient not taking: Reported on 03/25/2024) 30 tablet 0   rosuvastatin (CRESTOR) 10 MG tablet Take 10 mg by mouth daily. (Patient not taking: Reported on 03/25/2024)     No facility-administered medications prior to visit.    Allergies  Allergen Reactions   Codeine Nausea And Vomiting   Bee Venom Other (See Comments)    Review of Systems  Constitutional:  Positive for malaise/fatigue. Negative for chills and fever.  HENT:  Negative for congestion and hearing loss.   Eyes:  Negative for discharge.  Respiratory:  Negative for cough, sputum production and  shortness of breath.   Cardiovascular:  Negative for chest pain, palpitations and leg swelling.  Gastrointestinal:  Negative for abdominal pain, blood in stool, constipation, diarrhea, heartburn, nausea and vomiting.  Genitourinary:  Negative for dysuria, frequency, hematuria and urgency.  Musculoskeletal:  Positive for joint pain. Negative for back pain, falls and myalgias.  Skin:  Negative for rash.  Neurological:  Negative for dizziness, sensory change, loss of consciousness, weakness and headaches.  Endo/Heme/Allergies:  Negative for environmental allergies. Does not bruise/bleed easily.  Psychiatric/Behavioral:  Negative for depression and suicidal ideas. The patient is not nervous/anxious and does not have insomnia.        Objective:    Physical Exam Constitutional:      General: She is not in acute distress.    Appearance: Normal appearance. She is not diaphoretic.  HENT:     Head: Normocephalic and atraumatic.     Right Ear: Tympanic membrane, ear canal and external ear normal.     Left Ear: Tympanic membrane, ear canal and external ear normal.     Nose: Nose normal.     Mouth/Throat:     Mouth: Mucous membranes are moist.     Pharynx: Oropharynx is clear. No oropharyngeal exudate.  Eyes:     General: No scleral icterus.       Right eye: No discharge.        Left eye: No discharge.     Conjunctiva/sclera: Conjunctivae normal.     Pupils: Pupils are equal, round, and reactive to light.  Neck:     Thyroid : No thyromegaly.  Cardiovascular:     Rate and Rhythm: Normal rate and regular rhythm.     Heart sounds: Normal heart sounds. No murmur heard. Pulmonary:     Effort: Pulmonary effort is normal. No respiratory distress.     Breath sounds: Normal breath sounds. No wheezing or rales.  Abdominal:     General: Bowel sounds are normal. There is no distension.     Palpations: Abdomen is soft. There is no mass.     Tenderness: There is no abdominal tenderness.   Musculoskeletal:        General: No tenderness. Normal range of motion.     Cervical back: Normal range of motion and neck supple.  Lymphadenopathy:     Cervical: No cervical adenopathy.  Skin:    General: Skin is warm and dry.     Findings: No rash.  Neurological:     General: No focal deficit present.     Mental Status: She is alert and oriented to person, place, and time.     Cranial Nerves: No cranial nerve deficit.     Coordination: Coordination normal.     Deep Tendon Reflexes: Reflexes are normal and symmetric. Reflexes normal.  Psychiatric:        Mood and Affect: Mood normal.        Behavior: Behavior normal.        Thought Content: Thought content normal.        Judgment: Judgment normal.     BP 122/84 (BP Location: Left Arm, Patient Position: Sitting, Cuff Size: Large)   Pulse 70   Temp 98.1 F (36.7 C) (Oral)   Ht 5' 5 (1.651 m)   Wt 280 lb (127 kg)   SpO2 95%   BMI 46.59 kg/m  Wt Readings from Last 3 Encounters:  03/25/24 280 lb (127 kg)  11/10/23 266 lb 1.6 oz (120.7 kg)  03/21/23 276 lb 4 oz (125.3 kg)    Diabetic Foot Exam - Simple   No data filed    Lab Results  Component Value Date   WBC 7.2 03/25/2024   HGB 14.9 03/25/2024   HCT 45.1 03/25/2024   PLT 280.0 03/25/2024   GLUCOSE 104 (H) 03/25/2024   CHOL 174 03/25/2024   TRIG 144.0 03/25/2024   HDL 47.20 03/25/2024   LDLDIRECT 168.3 06/02/2014   LDLCALC 98 03/25/2024   ALT 23 03/25/2024   AST 19 03/25/2024   NA 139 03/25/2024   K 4.1 03/25/2024   CL 103 03/25/2024   CREATININE 0.95 03/25/2024   BUN 10 03/25/2024   CO2 29 03/25/2024   TSH 0.22 (L) 03/25/2024   HGBA1C 6.5 03/25/2024    Lab Results  Component Value Date   TSH 0.22 (L) 03/25/2024   Lab Results  Component Value Date   WBC 7.2 03/25/2024   HGB 14.9 03/25/2024   HCT 45.1 03/25/2024   MCV 87.4 03/25/2024   PLT 280.0 03/25/2024   Lab Results  Component Value Date   NA 139 03/25/2024   K 4.1 03/25/2024   CO2  29 03/25/2024   GLUCOSE 104 (H) 03/25/2024   BUN 10 03/25/2024   CREATININE 0.95 03/25/2024   BILITOT 0.6 03/25/2024   ALKPHOS 70 03/25/2024   AST 19 03/25/2024   ALT 23 03/25/2024   PROT 7.6 03/25/2024   ALBUMIN 4.1 03/25/2024   CALCIUM  9.4 03/25/2024   GFR 67.13 03/25/2024   Lab Results  Component Value Date   CHOL 174 03/25/2024   Lab Results  Component Value Date   HDL 47.20 03/25/2024   Lab Results  Component Value Date   LDLCALC 98 03/25/2024   Lab Results  Component Value Date   TRIG 144.0 03/25/2024   Lab Results  Component Value Date   CHOLHDL 4 03/25/2024   Lab Results  Component Value Date   HGBA1C 6.5 03/25/2024       Assessment & Plan:  Hyperglycemia Assessment & Plan: hgba1c acceptable, minimize simple carbs. Increase exercise as tolerated.   Orders: -     Comprehensive metabolic panel with GFR -     Hemoglobin A1c  Hyperlipidemia, unspecified hyperlipidemia type Assessment & Plan: Encourage heart healthy diet such as MIND or DASH diet, increase exercise, avoid trans fats, simple carbohydrates and processed foods, consider a krill or fish or flaxseed oil cap daily.   Orders: -     Lipid panel  Hypothyroidism, unspecified type Assessment & Plan: On Levothyroxine  but has missed 1/2 doses this month, check labs with appt later this month continue to monitor  Orders: -     TSH  Obesity due to excess calories with serious comorbidity, unspecified class Assessment & Plan: Encouraged DASH or MIND diet, decrease po intake and increase exercise as tolerated. Needs 7-8 hours of sleep nightly. Avoid trans fats, eat small, frequent meals every 4-5 hours with lean proteins, complex carbs and healthy fats. Minimize simple carbs, high fat foods and processed foods    Vitamin B 12 deficiency Assessment & Plan: Supplement and monitor   Orders: -     CBC with Differential/Platelet -     Vitamin B12  Vitamin D  deficiency Assessment &  Plan: Supplement and monitor   Orders: -     Vitamin D  1,25 dihydroxy  Preventative health care Assessment & Plan: Patient encouraged to maintain heart healthy diet, regular exercise, adequate sleep. Consider daily probiotics. Take medications as prescribed. Labs ordered and reviewed.  Pap 2019 with GYN will repeat in next year.  Colonoscopy 2023 repeat 2030  MM was 10/2022, repeat later this year or next year.   Orders: -     TSH  Encounter for screening mammogram for malignant neoplasm of breast -     3D Screening Mammogram, Left and Right; Future  Cervical cancer screening -     Ambulatory referral to Obstetrics / Gynecology  Arthralgia, unspecified joint -     Rheumatoid factor -     Sedimentation rate -     ANA  Generalized anxiety disorder -     Sertraline  HCl; Take one and 1/2 tablets daily.  Dispense: 135 tablet; Refill: 1  Major depressive disorder, recurrent episode, moderate (HCC) -     Sertraline  HCl; Take one and 1/2 tablets daily.  Dispense: 135 tablet; Refill: 1  Other orders -     Tirzepatide-Weight Management; Inject 2.5 mg into the skin once a week.  Dispense: 2 mL; Refill: 1 -     Levothyroxine  Sodium; TAKE 2 TABLETS BY MOUTH ONCE DAILY ON TUESDAY AND SATURDAY THEN TAKE 1 TABLET ONCE DAILY ON THE OTHER DAYS  Dispense: 90 tablet; Refill: 1 -     Atorvastatin  Calcium ; Take 1 tablet (10 mg total) by mouth daily.  Dispense: 90 tablet; Refill: 1 -     Omeprazole ; Take 1 capsule (40 mg total) by mouth daily.  Dispense: 90 capsule; Refill: 1    Assessment and Plan Assessment & Plan Obesity Weight gain attributed to menopause and genetic predisposition. Discussed multifactorial nature of weight gain during menopause, including loss of muscle mass and hormonal changes. - Encourage small frequent meals with protein every time - Advise water intake of 5-10 ounces every hour - Recommend increasing physical activity - Discuss potential use of weight loss  medication Zepbound (terzepatide) if insurance approves - Submit prior authorization for  Zepbound for weight loss - Monitor for side effects of Zepbound and adjust dosage as needed  Obstructive Sleep Apnea, Moderate Moderate obstructive sleep apnea diagnosed in 2015. Continues to use CPAP nightly and follows with pulmonology. - Submit prior authorization for Zepbound for sleep apnea - Consider repeat sleep study if insurance requires updated results - Continue CPAP therapy  Joint Pain Generalized joint pain, possibly exacerbated by weight gain and perimenopause. Differential includes rheumatoid arthritis due to family history. - Order rheumatoid factor, sed rate, and ANA tests - Encourage Mediterranean diet and lifestyle modifications to reduce inflammation  Hypothyroidism Well-managed on current levothyroxine  dose. Recent lab work will determine if dosage adjustments are needed. - Continue levothyroxine  200 mcg daily - Order thyroid  function tests  Vitamin D  Deficiency No specific discussion in the conversation.  Vitamin B12 Deficiency Reports fatigue, possibly due to inconsistent B12 supplementation. Previously on B12 injections but considering sublingual form for steady state levels. - Switch to sublingual B12 1000 mcg daily - Monitor B12 levels with lab work  Hyperlipidemia Managed with atorvastatin . - Continue atorvastatin  10 mg daily  Gastroesophageal Reflux Disease (GERD) Controlled with omeprazole . - Continue omeprazole  40 mg daily  Depression and Generalized Anxiety Disorder Well-managed on current sertraline  dose. - Continue sertraline  100 mg daily  Hyperglycemia No specific discussion in the conversation.  Suspected Fatty Liver Family history of fatty liver and cirrhosis. Discussed potential for FibroScan in the future to assess liver health. Emphasized the importance of a healthy diet and weight loss as first-line treatment. - Monitor liver function tests -  Discuss FibroScan availability in 2026  Recording duration: 36 minutes     Harlene Horton, MD

## 2024-03-29 LAB — VITAMIN D 1,25 DIHYDROXY
Vitamin D 1, 25 (OH)2 Total: 44 pg/mL (ref 18–72)
Vitamin D2 1, 25 (OH)2: <8 pg/mL
Vitamin D3 1, 25 (OH)2: 44 pg/mL

## 2024-03-29 LAB — RHEUMATOID FACTOR: Rheumatoid fact SerPl-aCnc: <10 [IU]/mL (ref <14)

## 2024-03-29 LAB — ANA: Anti Nuclear Antibody (ANA): NEGATIVE

## 2024-03-31 ENCOUNTER — Telehealth: Payer: Self-pay | Admitting: Internal Medicine

## 2024-03-31 NOTE — Telephone Encounter (Signed)
 Called pt to sch 1 yr f/u. Pt stated that she is not having any problems and was told she did not have to f/u unless she is having problems. I have deleted recall

## 2024-04-01 ENCOUNTER — Encounter: Payer: No Typology Code available for payment source | Admitting: Family Medicine

## 2024-06-14 ENCOUNTER — Ambulatory Visit (HOSPITAL_BASED_OUTPATIENT_CLINIC_OR_DEPARTMENT_OTHER)
Admission: RE | Admit: 2024-06-14 | Discharge: 2024-06-14 | Disposition: A | Source: Ambulatory Visit | Attending: Family Medicine | Admitting: Family Medicine

## 2024-06-14 ENCOUNTER — Encounter (HOSPITAL_BASED_OUTPATIENT_CLINIC_OR_DEPARTMENT_OTHER): Payer: Self-pay

## 2024-06-14 DIAGNOSIS — Z1231 Encounter for screening mammogram for malignant neoplasm of breast: Secondary | ICD-10-CM | POA: Diagnosis present

## 2024-06-28 ENCOUNTER — Other Ambulatory Visit: Payer: Self-pay | Admitting: Family Medicine

## 2024-07-26 ENCOUNTER — Ambulatory Visit: Admitting: Family Medicine
